# Patient Record
Sex: Female | Born: 1980 | Race: Black or African American | Hispanic: No | Smoking: Former smoker
Health system: Southern US, Community
[De-identification: ages and names within clinical notes are randomized; demographics above are authoritative.]

## PROBLEM LIST (undated history)

## (undated) DIAGNOSIS — E119 Type 2 diabetes mellitus without complications: Secondary | ICD-10-CM

## (undated) DIAGNOSIS — F329 Major depressive disorder, single episode, unspecified: Secondary | ICD-10-CM

## (undated) DIAGNOSIS — N912 Amenorrhea, unspecified: Secondary | ICD-10-CM

## (undated) DIAGNOSIS — F419 Anxiety disorder, unspecified: Secondary | ICD-10-CM

## (undated) DIAGNOSIS — J329 Chronic sinusitis, unspecified: Secondary | ICD-10-CM

## (undated) DIAGNOSIS — T4145XA Adverse effect of unspecified anesthetic, initial encounter: Secondary | ICD-10-CM

## (undated) DIAGNOSIS — N979 Female infertility, unspecified: Secondary | ICD-10-CM

## (undated) DIAGNOSIS — R0602 Shortness of breath: Secondary | ICD-10-CM

## (undated) DIAGNOSIS — R51 Headache: Secondary | ICD-10-CM

## (undated) DIAGNOSIS — T8859XA Other complications of anesthesia, initial encounter: Secondary | ICD-10-CM

## (undated) DIAGNOSIS — E282 Polycystic ovarian syndrome: Secondary | ICD-10-CM

## (undated) DIAGNOSIS — E669 Obesity, unspecified: Secondary | ICD-10-CM

## (undated) DIAGNOSIS — I10 Essential (primary) hypertension: Secondary | ICD-10-CM

## (undated) HISTORY — DX: Polycystic ovarian syndrome: E28.2

## (undated) HISTORY — DX: Amenorrhea, unspecified: N91.2

## (undated) HISTORY — DX: Essential (primary) hypertension: I10

## (undated) HISTORY — DX: Female infertility, unspecified: N97.9

## (undated) HISTORY — DX: Anxiety disorder, unspecified: F41.9

---

## 2009-02-08 DIAGNOSIS — F32A Depression, unspecified: Secondary | ICD-10-CM

## 2009-02-08 HISTORY — DX: Depression, unspecified: F32.A

## 2012-03-27 ENCOUNTER — Emergency Department (HOSPITAL_COMMUNITY): Payer: PRIVATE HEALTH INSURANCE

## 2012-03-27 ENCOUNTER — Other Ambulatory Visit: Payer: Self-pay | Admitting: Emergency Medicine

## 2012-03-27 ENCOUNTER — Emergency Department (HOSPITAL_COMMUNITY)
Admission: EM | Admit: 2012-03-27 | Discharge: 2012-03-27 | Disposition: A | Discharge status: Home / Self Care | Payer: PRIVATE HEALTH INSURANCE | Attending: Dermatology | Admitting: Dermatology

## 2012-03-27 ENCOUNTER — Encounter (HOSPITAL_COMMUNITY): Payer: Self-pay | Admitting: Emergency Medicine

## 2012-03-27 DIAGNOSIS — Z3202 Encounter for pregnancy test, result negative: Secondary | ICD-10-CM | POA: Insufficient documentation

## 2012-03-27 DIAGNOSIS — Z79899 Other long term (current) drug therapy: Secondary | ICD-10-CM | POA: Insufficient documentation

## 2012-03-27 DIAGNOSIS — R590 Localized enlarged lymph nodes: Secondary | ICD-10-CM

## 2012-03-27 DIAGNOSIS — Z8742 Personal history of other diseases of the female genital tract: Secondary | ICD-10-CM | POA: Insufficient documentation

## 2012-03-27 DIAGNOSIS — Z8639 Personal history of other endocrine, nutritional and metabolic disease: Secondary | ICD-10-CM | POA: Insufficient documentation

## 2012-03-27 DIAGNOSIS — F17200 Nicotine dependence, unspecified, uncomplicated: Secondary | ICD-10-CM | POA: Insufficient documentation

## 2012-03-27 DIAGNOSIS — IMO0002 Reserved for concepts with insufficient information to code with codable children: Secondary | ICD-10-CM | POA: Insufficient documentation

## 2012-03-27 DIAGNOSIS — J3489 Other specified disorders of nose and nasal sinuses: Secondary | ICD-10-CM | POA: Insufficient documentation

## 2012-03-27 DIAGNOSIS — R0989 Other specified symptoms and signs involving the circulatory and respiratory systems: Secondary | ICD-10-CM | POA: Insufficient documentation

## 2012-03-27 DIAGNOSIS — R0602 Shortness of breath: Secondary | ICD-10-CM | POA: Insufficient documentation

## 2012-03-27 DIAGNOSIS — E119 Type 2 diabetes mellitus without complications: Secondary | ICD-10-CM | POA: Insufficient documentation

## 2012-03-27 DIAGNOSIS — R599 Enlarged lymph nodes, unspecified: Secondary | ICD-10-CM | POA: Insufficient documentation

## 2012-03-27 DIAGNOSIS — R0600 Dyspnea, unspecified: Secondary | ICD-10-CM

## 2012-03-27 DIAGNOSIS — R0609 Other forms of dyspnea: Secondary | ICD-10-CM | POA: Insufficient documentation

## 2012-03-27 DIAGNOSIS — Z862 Personal history of diseases of the blood and blood-forming organs and certain disorders involving the immune mechanism: Secondary | ICD-10-CM | POA: Insufficient documentation

## 2012-03-27 HISTORY — DX: Type 2 diabetes mellitus without complications: E11.9

## 2012-03-27 LAB — CBC
Hemoglobin: 14.6 g/dL (ref 12.0–15.0)
MCH: 28.1 pg (ref 26.0–34.0)
MCHC: 33 g/dL (ref 30.0–36.0)
Platelets: 244 10*3/uL (ref 150–400)
RDW: 12.7 % (ref 11.5–15.5)

## 2012-03-27 LAB — COMPREHENSIVE METABOLIC PANEL
ALT: 19 U/L (ref 0–35)
Calcium: 9.4 mg/dL (ref 8.4–10.5)
GFR calc Af Amer: >90 mL/min (ref >90)
Glucose, Bld: 111 mg/dL — ABNORMAL HIGH (ref 70–99)
Sodium: 138 mEq/L (ref 135–145)
Total Protein: 8.2 g/dL (ref 6.0–8.3)

## 2012-03-27 LAB — URINALYSIS, ROUTINE W REFLEX MICROSCOPIC
Ketones, ur: NEGATIVE mg/dL
Leukocytes, UA: NEGATIVE
Nitrite: NEGATIVE
Protein, ur: NEGATIVE mg/dL
Urobilinogen, UA: 1 mg/dL (ref 0.0–1.0)

## 2012-03-27 LAB — RAPID URINE DRUG SCREEN, HOSP PERFORMED
Barbiturates: NOT DETECTED
Opiates: NOT DETECTED
Tetrahydrocannabinol: NOT DETECTED

## 2012-03-27 LAB — PREGNANCY, URINE: Preg Test, Ur: NEGATIVE

## 2012-03-27 LAB — PRO B NATRIURETIC PEPTIDE: Pro B Natriuretic peptide (BNP): 15.1 pg/mL (ref 0–125)

## 2012-03-27 MED ORDER — ALBUTEROL SULFATE (5 MG/ML) 0.5% IN NEBU
5.0000 mg | INHALATION_SOLUTION | Freq: Once | RESPIRATORY_TRACT | Status: AC
  Administered 2012-03-27: 5 mg via RESPIRATORY_TRACT
  Filled 2012-03-27: qty 1

## 2012-03-27 MED ORDER — IOHEXOL 350 MG/ML SOLN
100.0000 mL | Freq: Once | INTRAVENOUS | Status: AC | PRN
  Administered 2012-03-27: 100 mL via INTRAVENOUS

## 2012-03-27 MED ORDER — IPRATROPIUM BROMIDE 0.02 % IN SOLN
0.5000 mg | Freq: Once | RESPIRATORY_TRACT | Status: AC
  Administered 2012-03-27: 0.5 mg via RESPIRATORY_TRACT
  Filled 2012-03-27: qty 2.5

## 2012-03-27 MED ORDER — PREDNISONE 20 MG PO TABS: 30.0000 mg | ORAL_TABLET | Freq: Every day | ORAL | Status: AC

## 2012-03-27 MED ORDER — ALBUTEROL SULFATE HFA 108 (90 BASE) MCG/ACT IN AERS
2.0000 | INHALATION_SPRAY | RESPIRATORY_TRACT | Status: AC
  Administered 2012-03-27: 2 via RESPIRATORY_TRACT
  Filled 2012-03-27: qty 6.7

## 2012-03-27 NOTE — ED Provider Notes (Signed)
History     CSN: [id number]  Arrival date & time 03/27/12  1315   First MD Initiated Contact with Patient 03/27/12 1348      Chief Complaint  Patient presents with  . Facial Swelling    (Consider location/radiation/quality/duration/timing/severity/associated sxs/prior treatment) The history is provided by the patient.  pt presents w multiple complaints today. States has felt sob for the past 2-3 months with decreased exercise tolerance, doe. States this has caused her to quit exercising. Denies any associated cp or discomfort.+smoker. Denies hx asthma, no prior inhaler use.  Also states has persistent sinus congestion and occasional nasal drainage for the past 1-2 months. States was placed on flonase and abx by urgent care but no change in symptoms. Denies hx prior sinusitis or allergy symptoms. States today bilateral face felt swollen this morning, but better now. Denies facial rash. Pt indicates has hx polycystic ovaries, and that normally has no periods, but that in the past 1-2 months has had a period 3x. No current vaginal bleeding or discharge. No abd or pelvic pain. States w exercise and watching diet, trying to loose wt had lost 60 lbs in past 1-2 years, but states wt recently stable. Denies heat or cold intolerance, no sweats or chills, no skin or hair changes, no hx thyroid disease. Denies polyuria, polydipsia of hx diabetes.      Past Medical History  Diagnosis Date  . Diabetes mellitus without complication     History reviewed. No pertinent past surgical history.  No family history on file.  History  Substance Use Topics  . Smoking status: Current Every Day Smoker -- 3.00 packs/day  . Smokeless tobacco: Not on file  . Alcohol Use: No    OB History   Grav Para Term Preterm Abortions TAB SAB Ect Mult Living                  Review of Systems  Constitutional: Negative for fever and chills.  HENT: Positive for congestion. Negative for sore throat, trouble  swallowing, neck pain and neck stiffness.   Eyes: Negative for redness and visual disturbance.  Respiratory: Positive for shortness of breath. Negative for cough.   Cardiovascular: Negative for chest pain, palpitations and leg swelling.  Gastrointestinal: Negative for vomiting, abdominal pain and diarrhea.  Endocrine: Negative for polydipsia and polyuria.  Genitourinary: Negative for dysuria and flank pain.  Musculoskeletal: Negative for back pain.  Skin: Negative for rash.  Allergic/Immunologic: Negative for environmental allergies.  Neurological: Negative for weakness, numbness and headaches.  Hematological: Does not bruise/bleed easily.  Psychiatric/Behavioral: Negative for confusion.    Allergies  Review of patient's allergies indicates no known allergies.  Home Medications   Current Outpatient Rx  Name  Route  Sig  Dispense  Refill  . fluticasone (FLONASE) 50 MCG/ACT nasal spray   Nasal   Place 2 sprays into the nose daily.         Marland Kitchen oxymetazoline (AFRIN) 0.05 % nasal spray   Nasal   Place 2 sprays into the nose 2 (two) times daily.           BP 166/119  Pulse 120  Temp(Src) 98.6 F (37 C) (Oral)  SpO2 95%  LMP 03/23/2012  Physical Exam  Nursing note and vitals reviewed. Constitutional: She is oriented to person, place, and time. She appears well-developed and well-nourished. No distress.  HENT:  Mouth/Throat: Oropharynx is clear and moist.  Mild nasal congestion  Eyes: Conjunctivae are normal. Pupils are equal, round,  and reactive to light. No scleral icterus.  Neck: Normal range of motion. Neck supple. No tracheal deviation present. No thyromegaly present.  No stiffness or rigidity  Cardiovascular: Regular rhythm, normal heart sounds and intact distal pulses.  Exam reveals no gallop and no friction rub.   No murmur heard. tachycardic  Pulmonary/Chest: Effort normal. No respiratory distress. She has wheezes.  Faint exp wheeze.   Abdominal: Soft. Normal  appearance. She exhibits no distension and no mass. There is no tenderness. There is no rebound and no guarding.  obese  Genitourinary:  No cva tenderness  Musculoskeletal: She exhibits no edema and no tenderness.  Lymphadenopathy:    She has no cervical adenopathy.  Neurological: She is alert and oriented to person, place, and time.  Motor intact bil. Steady gait.   Skin: Skin is warm and dry. No rash noted.  Psychiatric: She has a normal mood and affect.    ED Course  Procedures (including critical care time)   Results for orders placed during the hospital encounter of 03/27/12  URINALYSIS, ROUTINE W REFLEX MICROSCOPIC      Result Value Range   Color, Urine YELLOW  YELLOW   APPearance CLEAR  CLEAR   Specific Gravity, Urine 1.015  1.005 - 1.030   pH 6.0  5.0 - 8.0   Glucose, UA NEGATIVE  NEGATIVE mg/dL   Hgb urine dipstick NEGATIVE  NEGATIVE   Bilirubin Urine NEGATIVE  NEGATIVE   Ketones, ur NEGATIVE  NEGATIVE mg/dL   Protein, ur NEGATIVE  NEGATIVE mg/dL   Urobilinogen, UA 1.0  0.0 - 1.0 mg/dL   Nitrite NEGATIVE  NEGATIVE   Leukocytes, UA NEGATIVE  NEGATIVE  CBC      Result Value Range   WBC 4.1  4.0 - 10.5 K/uL   RBC 5.20 (*) 3.87 - 5.11 MIL/uL   Hemoglobin 14.6  12.0 - 15.0 g/dL   HCT 46.9  62.9 - 52.8 %   MCV 85.2  78.0 - 100.0 fL   MCH 28.1  26.0 - 34.0 pg   MCHC 33.0  30.0 - 36.0 g/dL   RDW 41.3  24.4 - 01.0 %   Platelets 244  150 - 400 K/uL  COMPREHENSIVE METABOLIC PANEL      Result Value Range   Sodium 138  135 - 145 mEq/L   Potassium 3.8  3.5 - 5.1 mEq/L   Chloride 100  96 - 112 mEq/L   CO2 24  19 - 32 mEq/L   Glucose, Bld 111 (*) 70 - 99 mg/dL   BUN 12  6 - 23 mg/dL   Creatinine, Ser 2.72  0.50 - 1.10 mg/dL   Calcium 9.4  8.4 - 53.6 mg/dL   Total Protein 8.2  6.0 - 8.3 g/dL   Albumin 3.4 (*) 3.5 - 5.2 g/dL   AST 35  0 - 37 U/L   ALT 19  0 - 35 U/L   Alkaline Phosphatase 135 (*) 39 - 117 U/L   Total Bilirubin 0.3  0.3 - 1.2 mg/dL   GFR calc non Af  Amer >90  >90 mL/min   GFR calc Af Amer >90  >90 mL/min  D-DIMER, QUANTITATIVE      Result Value Range   D-Dimer, Quant 3.45 (*) 0.00 - 0.48 ug/mL-FEU  TROPONIN I      Result Value Range   Troponin I <0.30  <0.30 ng/mL  PRO B NATRIURETIC PEPTIDE      Result Value Range   Pro B  Natriuretic peptide (BNP) 15.1  0 - 125 pg/mL  PREGNANCY, URINE      Result Value Range   Preg Test, Ur NEGATIVE  NEGATIVE  URINE RAPID DRUG SCREEN (HOSP PERFORMED)      Result Value Range   Opiates NONE DETECTED  NONE DETECTED   Cocaine NONE DETECTED  NONE DETECTED   Benzodiazepines NONE DETECTED  NONE DETECTED   Amphetamines NONE DETECTED  NONE DETECTED   Tetrahydrocannabinol NONE DETECTED  NONE DETECTED   Barbiturates NONE DETECTED  NONE DETECTED  POCT PREGNANCY, URINE      Result Value Range   Preg Test, Ur NEGATIVE  NEGATIVE   Dg Chest 2 View  03/27/2012  *RADIOLOGY REPORT*  Clinical Data: Shortness of breath  CHEST - 2 VIEW  Comparison: None.  Findings: The interstitium is diffusely prominent.  There is no airspace consolidation.  Heart size and pulmonary vascularity are normal.  There is no appreciable adenopathy.  There is lower thoracic dextroscoliosis with thoracolumbar levoscoliosis.  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  In the acute setting, atypical pneumonia or allergic type phenomenon are possibilities. Any history of recent chemical exposure or allergic type reaction could be helpful for further assessment.  Would advise followup study in 5-7 days to further evaluate given the absence of prior studies to compare with current findings.  No airspace consolidation is currently appreciable.   Original Report Authenticated By: Bretta Bang, M.D.       MDM  Iv ns. Labs. Cxr.  Recheck vitals.  Albuterol and atrovent neb.   Date: 03/27/2012  Rate: 118  Rhythm: sinus tachycardia  QRS Axis: normal  Intervals: normal  ST/T Wave abnormalities:  normal  Conduction Disutrbances:none  Narrative Interpretation:   Old EKG Reviewed: none available   ddimer high, dyspnea, tachy - ct angio chest r/o pe pending.   Signed out to Dr Hyacinth Meeker, to check CT when back, recheck pt/vitals, dispo appropriately.           Suzi Roots, MD 03/27/12 1540

## 2012-03-27 NOTE — ED Notes (Signed)
Pt presenting to ed with c/o facial swelling onset this morning. Pt states she also has shortness of breath. Pt states she was recently on antibiotics for a sinus infection pt states the antibiotics have not helped. Pt states shortness of breath since December. Pt denies chest pain at this time. Pt states positive nausea no vomiting. Pt states she has had her period 3 times within the past 30 days. Pt with multiple c/o in triage

## 2012-03-27 NOTE — ED Notes (Signed)
Patient transported to CT 

## 2012-03-27 NOTE — ED Notes (Signed)
Voiced understanding of instructions given 

## 2012-03-27 NOTE — ED Notes (Signed)
Respiratory therapist paged for duoneb tx.

## 2012-04-20 ENCOUNTER — Encounter: Payer: Self-pay | Admitting: Internal Medicine

## 2012-04-20 ENCOUNTER — Ambulatory Visit (INDEPENDENT_AMBULATORY_CARE_PROVIDER_SITE_OTHER): Payer: PRIVATE HEALTH INSURANCE | Admitting: Internal Medicine

## 2012-04-20 VITALS — BP 122/80 | HR 118 | Temp 98.2°F | Ht 63.0 in | Wt 224.6 lb

## 2012-04-20 DIAGNOSIS — R599 Enlarged lymph nodes, unspecified: Secondary | ICD-10-CM

## 2012-04-20 DIAGNOSIS — R590 Localized enlarged lymph nodes: Secondary | ICD-10-CM

## 2012-04-20 DIAGNOSIS — J310 Chronic rhinitis: Secondary | ICD-10-CM | POA: Insufficient documentation

## 2012-04-20 MED ORDER — AZELASTINE-FLUTICASONE 137-50 MCG/ACT NA SUSP: 1.0000 | Freq: Two times a day (BID) | NASAL | Status: DC

## 2012-04-20 NOTE — Progress Notes (Signed)
Subjective:     Patient ID: Tanya Hardy, female   DOB: 1980-07-22      MRN: [id number]  HPI  41 yobf quit smoking Feb 2014 with onset of nasal symptoms in Aug 2013 progressing to sob x christmas 2013 seen in ER 03/27/12 with classic sarcoid changes so referred to pulmonary clinic 04/20/2012    04/20/2012 1st pulmonary eval cc sob used to exercise but can't exercise now and in fact brisk walk oob x 100 ft assoc with severe nasal obst and cough productive of tbsp in am to a cup by then of day with slt greenish tint.   rx with abx and flonase no better.   Trouble sleeping due to dry mouth and stuffy nose and sleeps sitting   Also denies any obvious fluctuation of symptoms with weather or environmental changes or other aggravating or alleviating factors except as outlined above   ROS  The following are not active complaints unless bolded sore throat, dysphagia, dental problems, itching, sneezing,  nasal congestion or excess/ purulent secretions, ear ache,   fever, chills, sweats, unintended wt loss, pleuritic or exertional cp, hemoptysis,  orthopnea pnd or leg swelling, presyncope, palpitations, heartburn, abdominal pain, anorexia, nausea, vomiting, diarrhea  or change in bowel or urinary habits, change in stools or urine, dysuria,hematuria,  rash, arthralgias, visual complaints, headache, numbness weakness or ataxia or problems with walking or coordination,  change in mood/affect or memory.        Review of Systems     Objective:   Physical Exam amb hoarse  bf nad with nasal tone to voice Wt Readings from Last 3 Encounters:  04/20/12 224 lb 9.6 oz (101.878 kg)      HEENT: nl dentition,   and orophanx. Nl external ear canals without cough reflex. Severe turbinate edema   NECK :  without JVD/Nodes/TM/ nl carotid upstrokes bilaterally   LUNGS: no acc muscle use, clear to A and P bilaterally without cough on insp or exp maneuvers   CV:  RRR  no s3 or murmur or increase in P2, no edema    ABD:  soft and nontender with nl excursion in the supine position. No bruits or organomegaly, bowel sounds nl  MS:  warm without deformities, calf tenderness, cyanosis or clubbing  SKIN: warm and dry without lesions    NEURO:  alert, approp, no deficits  cxr 03/27/12 Widespread interstitial disease of uncertain etiology or  chronicity. In the acute setting, atypical pneumonia or allergic  type phenomenon are possibilities.    Assessment:           Plan:

## 2012-04-20 NOTE — Patient Instructions (Signed)
I emphasized that nasal steroids (dymista)  have no immediate benefit in terms of improving symptoms.  To help them reached the target tissue, the patient should use Afrin two puffs every 12 hours applied one min before using the nasal steroids.  Afrin should be stopped after no more than 5 days.  If the symptoms worsen, Afrin can be restarted after 5 days off of therapy to prevent rebound congestion from overuse of Afrin.  I also emphasized that in no way are nasal steroids a concern in terms of "addiction".   Sarcoidosis is a benign inflammatory condition caused by  The  immune system being too revved up like a thermostat on your furnace that's partially  stuck causing arthitis, rash, short of breath and cough and vision issues.  It typically burns itself out in 75% of patients by the end of 3 years with little to indicate that we really change the natural course of the disease by aggressive treatments intended to alter it.  Treatment is generally reserved for patients with major symptoms we can attribute to sarcoid or if a vital organ (like the eye or kidney or nervous system) becomes affected.   [facility] bronch[facility] Someone will need to drive you home Nothing to eat after midnight before the test

## 2012-04-21 NOTE — Assessment & Plan Note (Signed)
-   trial of dymista 04/20/2012

## 2012-04-21 NOTE — Assessment & Plan Note (Signed)
This is almost certainly sarcoidosis with nasal involvement as well as there is not much else besides lymphoma or lymphangitic carcinomatosis that causes so much ILD plus adenopathy.  Discussed etiology natural hx and treatment options with pt and rec fob for tissue dx and she wants to think about it   For now focus on treating her chief cc = nasal obst symptoms

## 2012-04-25 ENCOUNTER — Telehealth: Payer: Self-pay | Admitting: Internal Medicine

## 2012-04-25 NOTE — Telephone Encounter (Signed)
Ok with me, I will schedule at 815 at [facility] and they will be in touch with her for details of when to arrive but nothing to eat or drink after midnight prior to the bronchoscopy

## 2012-04-25 NOTE — Telephone Encounter (Signed)
I spoke with pt and advised her will get the message over to MW letting him know. Is this date okay. Please advise thanks

## 2012-04-26 NOTE — Telephone Encounter (Signed)
Unable to schedule for 3/31-   See if she can do one week later since she prefers Monday

## 2012-04-26 NOTE — Telephone Encounter (Signed)
Called Respiratory, spoke with Marcelino Duster.  She was calling to let us know March 31 wasn't available but MW has already been informed of this.   Marcelino Duster aware we are waiting for pt to call us back to see if she can do 1 wk later - Monday, April 7.   Tentatively, we have booked bronch for Monday, April 7 at 8:15 am at [facility]  IF pt can do this date, we will need to confirm everything with Dr. Sherene Sires.  We will need to call Marcelino Duster back to either way.  I called pt back again.  Went directly to VM - lmomtcb

## 2012-04-26 NOTE — Telephone Encounter (Signed)
Ok for April 7

## 2012-04-26 NOTE — Telephone Encounter (Signed)
Is this a bronch if so dr wert schedules his own bronch i don't do those Tobe Sos

## 2012-04-26 NOTE — Telephone Encounter (Signed)
lmomtcb for pt -- 1 wk after March 31 is April 7

## 2012-04-26 NOTE — Telephone Encounter (Signed)
lmomtcb x1 for pt. Will also forward to [facility] for precert and to Dr. Sherene Sires as a reminder.

## 2012-04-26 NOTE — Telephone Encounter (Signed)
Michele @ Respiratory called to state that the date dr. Sherene Sires had requested is not avail for bronch. Call her back at [phone]. Hazel Sams

## 2012-04-26 NOTE — Telephone Encounter (Signed)
lmtcb

## 2012-04-26 NOTE — Telephone Encounter (Signed)
Patient aware of appt date/time and is okay with this. Bronch information to be mailed to patient with highlighted info on sheet. Will send message to leslie as FYI that this appt date for 05/15/12 at 815 is okay.

## 2012-05-12 ENCOUNTER — Encounter (HOSPITAL_COMMUNITY): Payer: Self-pay

## 2012-05-15 ENCOUNTER — Ambulatory Visit (HOSPITAL_COMMUNITY)
Admission: RE | Admit: 2012-05-15 | Discharge: 2012-05-15 | Disposition: A | Discharge status: Home / Self Care | Payer: PRIVATE HEALTH INSURANCE | Source: Ambulatory Visit | Attending: Internal Medicine | Admitting: Internal Medicine

## 2012-05-15 ENCOUNTER — Encounter (HOSPITAL_COMMUNITY): Payer: Self-pay | Admitting: Respiratory Therapy

## 2012-05-15 ENCOUNTER — Encounter (HOSPITAL_COMMUNITY)
Admission: RE | Disposition: A | Discharge status: Home / Self Care | Payer: Self-pay | Source: Ambulatory Visit | Attending: Internal Medicine

## 2012-05-15 DIAGNOSIS — D869 Sarcoidosis, unspecified: Secondary | ICD-10-CM | POA: Insufficient documentation

## 2012-05-15 DIAGNOSIS — Z538 Procedure and treatment not carried out for other reasons: Secondary | ICD-10-CM | POA: Insufficient documentation

## 2012-05-15 HISTORY — PX: VIDEO BRONCHOSCOPY: SHX5072

## 2012-05-15 LAB — GLUCOSE, CAPILLARY

## 2012-05-15 SURGERY — BRONCHOSCOPY, WITH FLUOROSCOPY
Anesthesia: Moderate Sedation | Laterality: Bilateral

## 2012-05-15 MED ORDER — MEPERIDINE HCL 100 MG/ML IJ SOLN: 100.0000 mg | Freq: Once | INTRAMUSCULAR | Status: DC

## 2012-05-15 MED ORDER — LIDOCAINE HCL 2 % EX GEL
CUTANEOUS | Status: DC | PRN
  Administered 2012-05-15: 1

## 2012-05-15 MED ORDER — MIDAZOLAM HCL 10 MG/2ML IJ SOLN
INTRAMUSCULAR | Status: DC | PRN
  Administered 2012-05-15 (×2): 5 mg via INTRAVENOUS

## 2012-05-15 MED ORDER — MEPERIDINE HCL 100 MG/ML IJ SOLN
INTRAMUSCULAR | Status: AC
  Filled 2012-05-15: qty 2

## 2012-05-15 MED ORDER — MIDAZOLAM HCL 10 MG/2ML IJ SOLN
INTRAMUSCULAR | Status: AC
  Filled 2012-05-15: qty 4

## 2012-05-15 MED ORDER — PHENYLEPHRINE HCL 0.25 % NA SOLN
NASAL | Status: DC | PRN
  Administered 2012-05-15: 1 via NASAL

## 2012-05-15 MED ORDER — SODIUM CHLORIDE 0.9 % IV SOLN
INTRAVENOUS | Status: DC
  Administered 2012-05-15: 09:00:00 via INTRAVENOUS

## 2012-05-15 MED ORDER — MEPERIDINE HCL 25 MG/ML IJ SOLN
INTRAMUSCULAR | Status: DC | PRN
  Administered 2012-05-15: 50 mg via INTRAVENOUS

## 2012-05-15 MED ORDER — MIDAZOLAM HCL 10 MG/2ML IJ SOLN: 1.0000 mg | Freq: Once | INTRAMUSCULAR | Status: DC

## 2012-05-15 MED ORDER — LIDOCAINE HCL (PF) 1 % IJ SOLN
INTRAMUSCULAR | Status: DC | PRN
  Administered 2012-05-15: 6 mL

## 2012-05-15 NOTE — H&P (Signed)
  31 yobf quit smoking Feb 2014 with onset of nasal symptoms in Aug 2013 progressing to sob x christmas 2013 seen in ER 03/27/12 with classic sarcoid changes so referred to pulmonary clinic 04/20/2012   04/20/2012 1st pulmonary eval cc sob used to exercise but can't exercise now and in fact brisk walk oob x 100 ft assoc with severe nasal obst and cough productive of tbsp in am to a cup by then of day with slt greenish tint. rx with abx and flonase no better.  Trouble sleeping due to dry mouth and stuffy nose and sleeps sitting  Also denies any obvious fluctuation of symptoms with weather or environmental changes or other aggravating or alleviating factors except as outlined above   ROS The following are not active complaints unless bolded  sore throat, dysphagia, dental problems, itching, sneezing, nasal congestion or excess/ purulent secretions, ear ache, fever, chills, sweats, unintended wt loss, pleuritic or exertional cp, hemoptysis, orthopnea pnd or leg swelling, presyncope, palpitations, heartburn, abdominal pain, anorexia, nausea, vomiting, diarrhea or change in bowel or urinary habits, change in stools or urine, dysuria,hematuria, rash, arthralgias, visual complaints, headache, numbness weakness or ataxia or problems with walking or coordination, change in mood/affect or memory.  Review of Systems   Objective:     Physical Exam  amb hoarse bf nad with nasal tone to voice    Wt Readings from Last 3 Encounters:    04/20/12  224 lb 9.6 oz (101.878 kg)    HEENT: nl dentition, and orophanx. Nl external ear canals without cough reflex. Severe turbinate edema  NECK : without JVD/Nodes/TM/ nl carotid upstrokes bilaterally  LUNGS: no acc muscle use, clear to A and P bilaterally without cough on insp or exp maneuvers  CV: RRR no s3 or murmur or increase in P2, no edema  ABD: soft and nontender with nl excursion in the supine position. No bruits or organomegaly, bowel sounds nl  MS: warm without  deformities, calf tenderness, cyanosis or clubbing  SKIN: warm and dry without lesions  NEURO: alert, approp, no deficits  cxr 03/27/12  Widespread interstitial disease of uncertain etiology or  chronicity. In the acute setting, atypical pneumonia or allergic  type phenomenon are possibilities.     Assessment:     Plan:       Lymphadenopathy, mediastinal - Nyoka Cowden, MD at 04/21/2012 9:01 AM    Status: Written Related Problem: Lymphadenopathy, mediastinal         This is almost certainly sarcoidosis with nasal involvement as well as there is not much else besides lymphoma or lymphangitic carcinomatosis that causes so much ILD plus adenopathy.  Discussed etiology natural hx and treatment options with pt and rec fob for tissue dx and she wants to think about it  For now focus on treating her chief cc = nasal obst symptoms   05/15/2012  Discussed in detail all the  indications, usual  risks and alternatives  relative to the benefits with patient who agrees to proceed with bronchoscopy with biopsy.   Sandrea Hughs, MD Pulmonary and Critical Care Medicine [facility] Cell [phone] After 5:30 PM or weekends, call [phone]

## 2012-05-15 NOTE — Progress Notes (Deleted)
Video Bronchoscopy Done  No samples taken

## 2012-05-15 NOTE — Op Note (Signed)
Bronchoscopy Procedure Note  Date of Operation: 07/09/2011  Pre-op Diagnosis: Sarcoid  Post-op Diagnosis: Same  Surgeon: Sandrea Hughs  Anesthesia: Monitored Local Anesthesia with Sedation  Operation: AttemptedVideo Flexible fiberoptic bronchoscopy, diagnostic   Findings: Unable to pass through either nose, unable to pass through mouth s severe gagging despite versed 10 mg IV and Demerol 50 mg IV and copious 1% xylocaine  Complications:None  Indications and History: See updated H and P same date. The risks, benefits, complications, treatment options and expected outcomes were discussed with the patient.  The possibilities of reaction to medication, pulmonary aspiration, perforation of a viscus, bleeding, failure to diagnose a condition and creating a complication requiring transfusion or operation were discussed with the patient who freely signed the consent.    Description of Procedure: The patient was re-examined in the bronchoscopy suite and the site of surgery properly noted/marked.  The patient was identified  and the procedure verified as Flexible Fiberoptic Bronchoscopy.  A Time Out was held and the above information confirmed.   After the induction of topical nasopharyngeal anesthesia, the patient was positioned  and the bronchoscope was attempted both nares and mouth and pt unable to tolerate so terminated procedure  Discussed situation with husband - will need to be done in OR with general anesthesia.  Sandrea Hughs, MD Pulmonary and Critical Care Medicine [facility] Cell [phone] After 5:30 PM or weekends, call [phone]

## 2012-05-16 ENCOUNTER — Encounter (HOSPITAL_COMMUNITY): Payer: Self-pay | Admitting: Internal Medicine

## 2012-05-17 ENCOUNTER — Telehealth: Payer: Self-pay | Admitting: Internal Medicine

## 2012-05-17 NOTE — Telephone Encounter (Signed)
Calling again in ref to previous msg.Tanya Hardy ° °

## 2012-05-17 NOTE — Telephone Encounter (Signed)
lmomtcb x1 

## 2012-05-18 NOTE — Telephone Encounter (Signed)
lmom to give me an afternoon and I'll try to work out with anesthesia putting her to sleep for the procedure

## 2012-05-18 NOTE — Telephone Encounter (Signed)
Returning call.  Please call after 3:30.

## 2012-05-18 NOTE — Telephone Encounter (Signed)
Dr Sherene Sires, when do you want to do the bronch in this pt? Please advise thanks!

## 2012-05-19 ENCOUNTER — Telehealth: Payer: Self-pay | Admitting: Internal Medicine

## 2012-05-19 NOTE — Telephone Encounter (Signed)
lmomtcb  

## 2012-05-19 NOTE — Telephone Encounter (Signed)
Tanya Cowden, MD at 05/18/2012 3:27 PM   Status: Signed            lmom to give me an afternoon and I'll try to work out with anesthesia putting her to sleep for the procedure   ----  I called and spoke with pt and she stated 05/26/12. Please advise MW thanks

## 2012-05-19 NOTE — Telephone Encounter (Signed)
Let her know that's good Friday and anesthesia not working that day, nor is resp.  They prefer Monday am and if not then give me some other dates/times and I'll work with anesthesia

## 2012-05-22 NOTE — Telephone Encounter (Signed)
LMOMTCB x 1 

## 2012-05-23 NOTE — Telephone Encounter (Signed)
lmtcb x1 for pt--that date is not possible per MW

## 2012-05-23 NOTE — Telephone Encounter (Signed)
Spoke with patient-- regarding new procedure date/time for bronch to be put to sleep. Patient states she can do Fri June 02, 2012 in the afternoon. Please advise if this is ok.  ALSO: Patient c/o bleeding nose. States it began as a faint and small amt 05/15/12 after attempted bronch but has progressively worsened and today has been bleedng for 3 hrs. Patient states it feels like it is trickling down her throat. States she she applied pressure and leaned head forward and back with no resolution. I asked patient is it bleeding so bad she thinks she should go to ED. She states she would like to hear recs per Dr. Sherene Sires. I advised patient that if its trickling down throat and has been bleeding for 3 hrs nonstop she should go to ED.  Patient again stated she would wait for recs from Dr. Sherene Sires. Dr. Sherene Sires please advise, thank you.

## 2012-05-23 NOTE — Telephone Encounter (Signed)
Pt called back again requesting to speak to nurse.  Tanya Hardy

## 2012-05-23 NOTE — Telephone Encounter (Signed)
Likely the reason her nose is bleeding and we can't pass the scope is the same problem in her lung and it may well be we can get a dx by having ent see her asap - they can be called from the er in a true emergency but prefer to see pts like her in office if possible > anyone first avail will do  As far as the bronch, right now the only slot anesthesia is available for is the 8am Monday slot and can't do the week of April 28

## 2012-05-23 NOTE — Telephone Encounter (Signed)
LMTCB x 1 

## 2012-05-23 NOTE — Telephone Encounter (Signed)
Pt called back.  2nd Issue:  Pt states that she is having bleeding out of her nose.  Wants to know why & what to do.  Pt wants called back right now.     Tanya Hardy

## 2012-05-23 NOTE — Telephone Encounter (Signed)
lmomtcb x 3 for pt 

## 2012-05-23 NOTE — Telephone Encounter (Signed)
Pt returned call.  Pt states she needs to do this procedure on Friday b/c that is when she is off of work.  Antionette Fairy

## 2012-05-23 NOTE — Telephone Encounter (Signed)
Patient stated she can not do Mon AM either. She requesting to another date and let her know far enough in advance so that she can take off work.  Patient can only come in tomorrow after 330pm. Scheduled w MW tomorrow at 4pm  Will forward to Dr. Sherene Sires as Lorain Childes

## 2012-05-24 ENCOUNTER — Ambulatory Visit (INDEPENDENT_AMBULATORY_CARE_PROVIDER_SITE_OTHER): Payer: PRIVATE HEALTH INSURANCE | Admitting: Internal Medicine

## 2012-05-24 ENCOUNTER — Encounter: Payer: Self-pay | Admitting: Internal Medicine

## 2012-05-24 VITALS — BP 134/86 | HR 120 | Temp 98.6°F | Ht 62.0 in | Wt 225.0 lb

## 2012-05-24 DIAGNOSIS — J310 Chronic rhinitis: Secondary | ICD-10-CM

## 2012-05-24 DIAGNOSIS — R590 Localized enlarged lymph nodes: Secondary | ICD-10-CM

## 2012-05-24 DIAGNOSIS — R599 Enlarged lymph nodes, unspecified: Secondary | ICD-10-CM

## 2012-05-24 MED ORDER — METHYLPREDNISOLONE ACETATE 80 MG/ML IJ SUSP
120.0000 mg | Freq: Once | INTRAMUSCULAR | Status: AC
  Administered 2012-05-24: 120 mg via INTRAMUSCULAR

## 2012-05-24 MED ORDER — AMOXICILLIN-POT CLAVULANATE 875-125 MG PO TABS: 1.0000 | ORAL_TABLET | Freq: Two times a day (BID) | ORAL | Status: DC

## 2012-05-24 NOTE — Progress Notes (Signed)
Subjective:     Patient ID: Tanya Hardy, female   DOB: 26-Nov-1980      MRN: [id number]  HPI  33 yobf quit smoking Feb 2014 with onset of nasal symptoms since Aug 2013 progressing to sob x christmas 2013 seen in ER 03/27/12 with classic sarcoid changes so referred to pulmonary clinic 04/20/2012    04/20/2012 1st pulmonary eval cc sob used to exercise but can't exercise now and in fact brisk walk oob x 100 ft assoc with severe nasal obst and cough productive of tbsp in am to a cup by then of day with slt greenish tint.   rx with abx and flonase no better.  rec FOB  FOB  05/15/12  not able to do because of nasal obstuction and couldn't tolerate oral   05/24/2012 f/u ov/Tanya Hardy possible sarcoid Chief Complaint  Patient presents with  . Acute Visit    Epistaxis, facial swelling and dry eyes on and off x 1 wk   not able to stop afrin chronically overusing. No sob but intermittent low grade epistaxis since fob attermpt 4/7.  No purulent secretions. No wore sob.   No obvious daytime variabilty or assoc   cp or chest tightness, subjective wheeze overt sinus or hb symptoms. No unusual exp hx or h/o childhood pna/ asthma or premature birth to her knowledge.   No h/o asa intolerance.  Also denies any obvious fluctuation of symptoms with weather or environmental changes or other aggravating or alleviating factors except as outlined above   ROS  The following are not active complaints unless bolded sore throat, dysphagia, dental problems, itching, sneezing,  nasal congestion or excess/ purulent secretions, ear ache,   fever, chills, sweats, unintended wt loss, pleuritic or exertional cp, hemoptysis,  orthopnea pnd or leg swelling, presyncope, palpitations, heartburn, abdominal pain, anorexia, nausea, vomiting, diarrhea  or change in bowel or urinary habits, change in stools or urine, dysuria,hematuria,  rash, arthralgias, visual complaints, headache, numbness weakness or ataxia or problems with walking or  coordination,  change in mood/affect or memory.              Objective:   Physical Exam amb hoarse  bf nad with nasal tone to voice   05/24/2012   225  Wt Readings from Last 3 Encounters:  04/20/12 224 lb 9.6 oz (101.878 kg)      HEENT: nl dentition,   and orophanx. Nl external ear canals without cough reflex. Severe turbinate edema with clots obst on L    NECK :  without JVD/Nodes/TM/ nl carotid upstrokes bilaterally   LUNGS: no acc muscle use, clear to A and P bilaterally without cough on insp or exp maneuvers   CV:  RRR  no s3 or murmur or increase in P2, no edema   ABD:  soft and nontender with nl excursion in the supine position. No bruits or organomegaly, bowel sounds nl  MS:  warm without deformities, calf tenderness, cyanosis or clubbing  SKIN: warm and dry without lesions       cxr 03/27/12 Widespread interstitial disease of uncertain etiology or  chronicity. In the acute setting, atypical pneumonia or allergic  type phenomenon are possibilities.    Assessment:           Plan:

## 2012-05-24 NOTE — Patient Instructions (Addendum)
Augmentin 875 twice daily x 10 days  Please see patient coordinator before you leave today  to schedule ENT ASAP  Once the nose issue is resolved we can reschedule the bronchoscopy without being restricted to Monday am but right now that is all that's available

## 2012-05-26 NOTE — Assessment & Plan Note (Signed)
-   See CT chest 03/27/12> fob  05/15/12 could not pass fob  Strongly suspect sarcoid but have not been able to obtain tissue dx which would be required to treat her chronically.  It may well be she also has sarcoid related rhinitis and since active epistaxis is her greatest immediate problem rec ENT eval first then if needed could try repeat fob with general anesthesia backup in case oral route needed

## 2012-05-26 NOTE — Assessment & Plan Note (Signed)
-   trial of dymista 04/20/2012 > not improved 05/24/12 > refer to Dr Pollyann Kennedy

## 2012-05-28 ENCOUNTER — Emergency Department (HOSPITAL_COMMUNITY)
Admission: EM | Admit: 2012-05-28 | Discharge: 2012-05-28 | Disposition: A | Discharge status: Home / Self Care | Payer: PRIVATE HEALTH INSURANCE | Attending: Emergency Medicine | Admitting: Emergency Medicine

## 2012-05-28 ENCOUNTER — Encounter (HOSPITAL_COMMUNITY): Payer: Self-pay | Admitting: *Deleted

## 2012-05-28 ENCOUNTER — Emergency Department (HOSPITAL_COMMUNITY): Payer: PRIVATE HEALTH INSURANCE

## 2012-05-28 DIAGNOSIS — Z79899 Other long term (current) drug therapy: Secondary | ICD-10-CM | POA: Insufficient documentation

## 2012-05-28 DIAGNOSIS — E119 Type 2 diabetes mellitus without complications: Secondary | ICD-10-CM | POA: Insufficient documentation

## 2012-05-28 DIAGNOSIS — R040 Epistaxis: Secondary | ICD-10-CM | POA: Insufficient documentation

## 2012-05-28 DIAGNOSIS — Z87891 Personal history of nicotine dependence: Secondary | ICD-10-CM | POA: Insufficient documentation

## 2012-05-28 NOTE — ED Notes (Signed)
The pt has had a nose bleed intermittently since April 7th after having a bronscopy through her nose

## 2012-05-28 NOTE — ED Provider Notes (Addendum)
History     CSN: [id number]  Arrival date & time 05/28/12  4098   First MD Initiated Contact with Patient 05/28/12 [phone]      Chief Complaint  Patient presents with  . Epistaxis    (Consider location/radiation/quality/duration/timing/severity/associated sxs/prior treatment) HPI....bilateral nosebleed this evening.  Improved hemostasis now.   Patient is being evaluated by pulmonary for possible nasal sarcoidosis.  Nothing makes symptoms better or worse. Severity is mild to moderate.   She has also been about an Actuary.   CT scan is pending  Past Medical History  Diagnosis Date  . Diabetes mellitus without complication     Past Surgical History  Procedure Laterality Date  . Video bronchoscopy Bilateral 05/15/2012    Procedure: VIDEO BRONCHOSCOPY WITH FLUORO;  Surgeon: Nyoka Cowden, MD;  Location: [facility];  Service: Cardiopulmonary;  Laterality: Bilateral;    Family History  Problem Relation Age of Onset  . Sarcoidosis Brother   . Brain cancer Paternal Grandfather   . Colon cancer Paternal Aunt     History  Substance Use Topics  . Smoking status: Former Smoker -- 0.25 packs/day for 11 years    Types: Cigarettes    Quit date: 04/07/2012  . Smokeless tobacco: Never Used  . Alcohol Use: No    OB History   Grav Para Term Preterm Abortions TAB SAB Ect Mult Living                  Review of Systems  All other systems reviewed and are negative.    Allergies  Review of patient's allergies indicates no known allergies.  Home Medications   Current Outpatient Rx  Name  Route  Sig  Dispense  Refill  . FOLIC ACID PO   Oral   Take 1 tablet by mouth daily.         Marland Kitchen oxymetazoline (AFRIN) 0.05 % nasal spray   Nasal   Place 2 sprays into the nose 2 (two) times daily.         . Prenatal MV-Min-Fe Fum-FA-DHA (PRENATAL 1 PO)   Oral   Take 1 tablet by mouth daily.           BP 151/100  Pulse 100  Resp 20  SpO2 92%  LMP 04/30/2012  Physical  Exam  Nursing note and vitals reviewed. Constitutional: She appears well-developed and well-nourished.  HENT:  Head: Normocephalic and atraumatic.  Blood clot in bilateral nares  Eyes: Conjunctivae and EOM are normal. Pupils are equal, round, and reactive to light.  Neck: Normal range of motion. Neck supple.  Cardiovascular: Normal rate, regular rhythm and normal heart sounds.   Pulmonary/Chest: Effort normal and breath sounds normal.  Abdominal: Soft. Bowel sounds are normal.  Musculoskeletal: Normal range of motion.  Neurological: She is alert.  Skin: Skin is warm and dry.  Psychiatric: She has a normal mood and affect.    ED Course  Procedures (including critical care time)  Labs Reviewed - No data to display No results found.   No diagnosis found.    MDM  CT scan maxillofacial  reveals enlargement of the bilateral lacrimal glands with mild overlying pre-septal soft tissue swelling.  ENT referral. No active bleeding at this time.      Donnetta Hutching, MD 05/28/12 [id number]  Donnetta Hutching, MD 06/09/12 [phone]

## 2012-09-11 ENCOUNTER — Encounter (HOSPITAL_COMMUNITY): Payer: Self-pay

## 2012-09-11 ENCOUNTER — Encounter (HOSPITAL_COMMUNITY): Payer: Self-pay | Admitting: Pharmacy Technician

## 2012-09-11 ENCOUNTER — Encounter (HOSPITAL_COMMUNITY)
Admission: RE | Admit: 2012-09-11 | Discharge: 2012-09-11 | Disposition: A | Discharge status: Home / Self Care | Payer: PRIVATE HEALTH INSURANCE | Source: Ambulatory Visit | Attending: Otolaryngology | Admitting: Otolaryngology

## 2012-09-11 DIAGNOSIS — Z01818 Encounter for other preprocedural examination: Secondary | ICD-10-CM | POA: Insufficient documentation

## 2012-09-11 DIAGNOSIS — Z01812 Encounter for preprocedural laboratory examination: Secondary | ICD-10-CM | POA: Insufficient documentation

## 2012-09-11 HISTORY — DX: Shortness of breath: R06.02

## 2012-09-11 HISTORY — DX: Other complications of anesthesia, initial encounter: T88.59XA

## 2012-09-11 HISTORY — DX: Chronic sinusitis, unspecified: J32.9

## 2012-09-11 HISTORY — DX: Major depressive disorder, single episode, unspecified: F32.9

## 2012-09-11 HISTORY — DX: Headache: R51

## 2012-09-11 HISTORY — DX: Adverse effect of unspecified anesthetic, initial encounter: T41.45XA

## 2012-09-11 LAB — CBC
HCT: 42.1 % (ref 36.0–46.0)
Hemoglobin: 13.9 g/dL (ref 12.0–15.0)
MCHC: 33 g/dL (ref 30.0–36.0)
MCV: 84.5 fL (ref 78.0–100.0)
WBC: 5.2 10*3/uL (ref 4.0–10.5)

## 2012-09-11 NOTE — Pre-Procedure Instructions (Signed)
Meta Kroenke  09/11/2012   Your procedure is scheduled on:  September 15, 2012 at 11:30  Report to [facility] at 9:30 AM.  Call this number if you have problems the morning of surgery: [phone]   Remember:   Do not eat food or drink liquids after midnight.   Take these medicines the morning of surgery with A SIP OF WATER: Afrin (Oxymetazoline) Nasal Spray              STOP  Prenatal Vitamins as of today, 09/11/12   Do not wear jewelry, make-up or nail polish.  Do not wear lotions, powders, or perfumes. You may wear deodorant.  Do not shave 48 hours prior to surgery. Men may shave face and neck.  Do not bring valuables to the hospital.  [facility] is not responsible                   for any belongings or valuables.  Contacts, dentures or bridgework may not be worn into surgery.  Leave suitcase in the car. After surgery it may be brought to your room.  For patients admitted to the hospital, checkout time is 11:00 AM the day of  discharge.   Patients discharged the day of surgery will not be allowed to drive  home.  Name and phone number of your driver: Family/Friend  Special Instructions: Shower using CHG 2 nights before surgery and the night before surgery.  If you shower the day of surgery use CHG.  Use special wash - you have one bottle of CHG for all showers.  You should use approximately 1/3 of the bottle for each shower.   Please read over the following fact sheets that you were given: Pain Booklet, Coughing and Deep Breathing and Surgical Site Infection Prevention

## 2012-09-11 NOTE — H&P (Signed)
Assessment  Chronic pansinusitis (473.8) (J32.4). Chronic rhinitis (472.0) (J31.0). Orders  CT Maxillofacial w/o contrast; Requested for: 01 Sep 2012. Discussed  She completed the antibiotics and feels about the same. Ears are healthy and clear. Oral cavity and pharynx clear. Nasal exam reveals significant improvement of the nasal airways bilaterally. The mucosa is still very dry and scabbing all the way back on both sides.   CT reveals complete pansinusitis without much change. Recommend we proceed with endoscopic sinus surgery. This will involve bilateral maxillary, ethmoid and frontal sinus surgery. This will be done at [facility] planning outpatient nature. Risks and benefits of surgery were discussed in detail. All questions were answered. Reason For Visit  No change in congestion. Allergies  No Known Drug Allergies. Current Meds  Folic Acid TABS;; RPT Prenatal TABS;; RPT. 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 Family Hx  Family history of diabetes mellitus: Grandmother (V18.0) (Z83.3) Family history of hypertension: Mother (V17.49) (Z28.49) Family history of rheumatoid arthritis: Grandmother (V17.7) (Z82.61) Stroke syndrome (I67.89). Personal Hx  Former smoker 28Feb2014 [phone]) [phone]) No alcohol use No caffeine use. Signature  Electronically signed by : Serena Colonel  M.D.; 09/01/2012 5:25 PM EST.

## 2012-09-14 MED ORDER — CEFAZOLIN SODIUM-DEXTROSE 2-3 GM-% IV SOLR
2.0000 g | INTRAVENOUS | Status: DC
  Filled 2012-09-14: qty 50

## 2012-09-15 ENCOUNTER — Ambulatory Visit (HOSPITAL_COMMUNITY): Payer: PRIVATE HEALTH INSURANCE | Admitting: Anesthesiology

## 2012-09-15 ENCOUNTER — Inpatient Hospital Stay (HOSPITAL_COMMUNITY)
Admission: RE | Admit: 2012-09-15 | Discharge: 2012-09-17 | DRG: 981 | Disposition: A | Discharge status: Home / Self Care | Payer: PRIVATE HEALTH INSURANCE | Source: Ambulatory Visit | Attending: Otolaryngology | Admitting: Otolaryngology

## 2012-09-15 ENCOUNTER — Ambulatory Visit (HOSPITAL_COMMUNITY): Payer: PRIVATE HEALTH INSURANCE

## 2012-09-15 ENCOUNTER — Encounter (HOSPITAL_COMMUNITY): Payer: Self-pay | Admitting: Vascular Surgery

## 2012-09-15 ENCOUNTER — Encounter (HOSPITAL_COMMUNITY): Payer: Self-pay | Admitting: Anesthesiology

## 2012-09-15 ENCOUNTER — Encounter (HOSPITAL_COMMUNITY)
Admission: RE | Disposition: A | Discharge status: Home / Self Care | Payer: Self-pay | Source: Ambulatory Visit | Attending: Otolaryngology

## 2012-09-15 ENCOUNTER — Encounter: Payer: Self-pay | Admitting: Internal Medicine

## 2012-09-15 DIAGNOSIS — R0602 Shortness of breath: Secondary | ICD-10-CM

## 2012-09-15 DIAGNOSIS — Z87891 Personal history of nicotine dependence: Secondary | ICD-10-CM

## 2012-09-15 DIAGNOSIS — E119 Type 2 diabetes mellitus without complications: Secondary | ICD-10-CM | POA: Diagnosis present

## 2012-09-15 DIAGNOSIS — J95821 Acute postprocedural respiratory failure: Secondary | ICD-10-CM | POA: Diagnosis present

## 2012-09-15 DIAGNOSIS — Z6841 Body Mass Index (BMI) 40.0 and over, adult: Secondary | ICD-10-CM

## 2012-09-15 DIAGNOSIS — I441 Atrioventricular block, second degree: Secondary | ICD-10-CM

## 2012-09-15 DIAGNOSIS — J330 Polyp of nasal cavity: Secondary | ICD-10-CM | POA: Diagnosis present

## 2012-09-15 DIAGNOSIS — R599 Enlarged lymph nodes, unspecified: Secondary | ICD-10-CM

## 2012-09-15 DIAGNOSIS — J99 Respiratory disorders in diseases classified elsewhere: Secondary | ICD-10-CM | POA: Diagnosis present

## 2012-09-15 DIAGNOSIS — J310 Chronic rhinitis: Secondary | ICD-10-CM | POA: Diagnosis present

## 2012-09-15 DIAGNOSIS — R590 Localized enlarged lymph nodes: Secondary | ICD-10-CM

## 2012-09-15 DIAGNOSIS — J4489 Other specified chronic obstructive pulmonary disease: Secondary | ICD-10-CM | POA: Diagnosis present

## 2012-09-15 DIAGNOSIS — R040 Epistaxis: Secondary | ICD-10-CM | POA: Diagnosis present

## 2012-09-15 DIAGNOSIS — R918 Other nonspecific abnormal finding of lung field: Secondary | ICD-10-CM | POA: Diagnosis present

## 2012-09-15 DIAGNOSIS — F3289 Other specified depressive episodes: Secondary | ICD-10-CM | POA: Diagnosis present

## 2012-09-15 DIAGNOSIS — J449 Chronic obstructive pulmonary disease, unspecified: Secondary | ICD-10-CM | POA: Diagnosis present

## 2012-09-15 DIAGNOSIS — F329 Major depressive disorder, single episode, unspecified: Secondary | ICD-10-CM | POA: Diagnosis present

## 2012-09-15 DIAGNOSIS — J329 Chronic sinusitis, unspecified: Secondary | ICD-10-CM

## 2012-09-15 DIAGNOSIS — J328 Other chronic sinusitis: Secondary | ICD-10-CM | POA: Diagnosis present

## 2012-09-15 DIAGNOSIS — D869 Sarcoidosis, unspecified: Principal | ICD-10-CM | POA: Diagnosis present

## 2012-09-15 DIAGNOSIS — J9600 Acute respiratory failure, unspecified whether with hypoxia or hypercapnia: Secondary | ICD-10-CM

## 2012-09-15 DIAGNOSIS — J9601 Acute respiratory failure with hypoxia: Secondary | ICD-10-CM | POA: Diagnosis present

## 2012-09-15 HISTORY — PX: NASAL SINUS SURGERY: SHX719

## 2012-09-15 HISTORY — DX: Obesity, unspecified: E66.9

## 2012-09-15 LAB — GLUCOSE, CAPILLARY
Glucose-Capillary: 82 mg/dL (ref 70–99)
Glucose-Capillary: 99 mg/dL (ref 70–99)

## 2012-09-15 SURGERY — SINUS SURGERY, ENDOSCOPIC
Anesthesia: General | Site: Nose | Wound class: Clean Contaminated

## 2012-09-15 MED ORDER — CEPHALEXIN 250 MG/5ML PO SUSR
500.0000 mg | Freq: Two times a day (BID) | ORAL | Status: DC
  Administered 2012-09-15 – 2012-09-16 (×3): 500 mg via ORAL
  Filled 2012-09-15 (×5): qty 10

## 2012-09-15 MED ORDER — 0.9 % SODIUM CHLORIDE (POUR BTL) OPTIME
TOPICAL | Status: DC | PRN
  Administered 2012-09-15: 1000 mL

## 2012-09-15 MED ORDER — ARTIFICIAL TEARS OP OINT
TOPICAL_OINTMENT | OPHTHALMIC | Status: DC | PRN
  Administered 2012-09-15: 1 via OPHTHALMIC

## 2012-09-15 MED ORDER — OXYMETAZOLINE HCL 0.05 % NA SOLN
NASAL | Status: AC
  Filled 2012-09-15: qty 15

## 2012-09-15 MED ORDER — DIPHENHYDRAMINE HCL 50 MG/ML IJ SOLN: 12.5000 mg | Freq: Four times a day (QID) | INTRAMUSCULAR | Status: DC | PRN

## 2012-09-15 MED ORDER — OXYCODONE HCL 5 MG/5ML PO SOLN: 5.0000 mg | Freq: Once | ORAL | Status: DC | PRN

## 2012-09-15 MED ORDER — INSULIN ASPART 100 UNIT/ML ~~LOC~~ SOLN
0.0000 [IU] | Freq: Three times a day (TID) | SUBCUTANEOUS | Status: DC
  Administered 2012-09-16: 2 [IU] via SUBCUTANEOUS

## 2012-09-15 MED ORDER — OXYCODONE HCL 5 MG PO TABS: 5.0000 mg | ORAL_TABLET | Freq: Once | ORAL | Status: DC | PRN

## 2012-09-15 MED ORDER — MIDAZOLAM HCL 5 MG/5ML IJ SOLN
INTRAMUSCULAR | Status: DC | PRN
  Administered 2012-09-15 (×2): .5 mg via INTRAVENOUS

## 2012-09-15 MED ORDER — PROMETHAZINE HCL 25 MG PO TABS: 25.0000 mg | ORAL_TABLET | Freq: Four times a day (QID) | ORAL | Status: DC | PRN

## 2012-09-15 MED ORDER — LIDOCAINE HCL (CARDIAC) 20 MG/ML IV SOLN
INTRAVENOUS | Status: DC | PRN
  Administered 2012-09-15: 100 mg via INTRAVENOUS

## 2012-09-15 MED ORDER — LACTATED RINGERS IV SOLN
INTRAVENOUS | Status: DC | PRN
  Administered 2012-09-15 (×2): via INTRAVENOUS

## 2012-09-15 MED ORDER — ROCURONIUM BROMIDE 100 MG/10ML IV SOLN
INTRAVENOUS | Status: DC | PRN
  Administered 2012-09-15: 50 mg via INTRAVENOUS

## 2012-09-15 MED ORDER — INSULIN ASPART 100 UNIT/ML ~~LOC~~ SOLN: 0.0000 [IU] | Freq: Every day | SUBCUTANEOUS | Status: DC

## 2012-09-15 MED ORDER — ALBUTEROL SULFATE (5 MG/ML) 0.5% IN NEBU
INHALATION_SOLUTION | RESPIRATORY_TRACT | Status: AC
  Filled 2012-09-15: qty 0.5

## 2012-09-15 MED ORDER — ESMOLOL HCL 10 MG/ML IV SOLN
INTRAVENOUS | Status: DC | PRN
  Administered 2012-09-15 (×4): 10 mg via INTRAVENOUS

## 2012-09-15 MED ORDER — GLYCOPYRROLATE 0.2 MG/ML IJ SOLN
INTRAMUSCULAR | Status: DC | PRN
  Administered 2012-09-15: .8 mg via INTRAVENOUS

## 2012-09-15 MED ORDER — CEPHALEXIN 500 MG PO CAPS: 500.0000 mg | ORAL_CAPSULE | Freq: Three times a day (TID) | ORAL | Status: DC

## 2012-09-15 MED ORDER — DEXTROSE-NACL 5-0.9 % IV SOLN
INTRAVENOUS | Status: DC
  Administered 2012-09-15: 17:00:00 via INTRAVENOUS

## 2012-09-15 MED ORDER — ONDANSETRON HCL 4 MG/2ML IJ SOLN
INTRAMUSCULAR | Status: DC | PRN
  Administered 2012-09-15 (×3): 4 mg via INTRAVENOUS

## 2012-09-15 MED ORDER — FENTANYL CITRATE 0.05 MG/ML IJ SOLN
INTRAMUSCULAR | Status: DC | PRN
  Administered 2012-09-15 (×3): 50 ug via INTRAVENOUS
  Administered 2012-09-15: 100 ug via INTRAVENOUS

## 2012-09-15 MED ORDER — NEOSTIGMINE METHYLSULFATE 1 MG/ML IJ SOLN
INTRAMUSCULAR | Status: DC | PRN
  Administered 2012-09-15: 5 mg via INTRAVENOUS

## 2012-09-15 MED ORDER — PROPOFOL 10 MG/ML IV BOLUS
INTRAVENOUS | Status: DC | PRN
  Administered 2012-09-15: 200 mg via INTRAVENOUS

## 2012-09-15 MED ORDER — IBUPROFEN 100 MG/5ML PO SUSP
400.0000 mg | Freq: Four times a day (QID) | ORAL | Status: DC | PRN
  Administered 2012-09-15 – 2012-09-17 (×4): 400 mg via ORAL
  Filled 2012-09-15 (×8): qty 20

## 2012-09-15 MED ORDER — MORPHINE SULFATE 2 MG/ML IJ SOLN: 1.0000 mg | INTRAMUSCULAR | Status: DC | PRN

## 2012-09-15 MED ORDER — PROMETHAZINE HCL 25 MG RE SUPP: 25.0000 mg | Freq: Four times a day (QID) | RECTAL | Status: DC | PRN

## 2012-09-15 MED ORDER — LIDOCAINE-EPINEPHRINE 1 %-1:100000 IJ SOLN
INTRAMUSCULAR | Status: AC
  Filled 2012-09-15: qty 1

## 2012-09-15 MED ORDER — PHENYLEPHRINE HCL 10 MG/ML IJ SOLN
INTRAMUSCULAR | Status: DC | PRN
  Administered 2012-09-15 (×2): 40 ug via INTRAVENOUS
  Administered 2012-09-15 (×4): 80 ug via INTRAVENOUS

## 2012-09-15 MED ORDER — MOMETASONE FURO-FORMOTEROL FUM 100-5 MCG/ACT IN AERO
2.0000 | INHALATION_SPRAY | Freq: Two times a day (BID) | RESPIRATORY_TRACT | Status: DC
  Administered 2012-09-15 – 2012-09-17 (×4): 2 via RESPIRATORY_TRACT
  Filled 2012-09-15: qty 8.8

## 2012-09-15 MED ORDER — HYDROMORPHONE HCL PF 1 MG/ML IJ SOLN
0.2500 mg | INTRAMUSCULAR | Status: DC | PRN
  Administered 2012-09-15: 0.25 mg via INTRAVENOUS

## 2012-09-15 MED ORDER — OXYMETAZOLINE HCL 0.05 % NA SOLN
NASAL | Status: DC | PRN
  Administered 2012-09-15: 1 via NASAL

## 2012-09-15 MED ORDER — DEXTROSE-NACL 5-0.9 % IV SOLN
INTRAVENOUS | Status: DC
  Administered 2012-09-16: 06:00:00 via INTRAVENOUS

## 2012-09-15 MED ORDER — HYDROCODONE-ACETAMINOPHEN 7.5-325 MG PO TABS: 1.0000 | ORAL_TABLET | Freq: Four times a day (QID) | ORAL | Status: DC | PRN

## 2012-09-15 MED ORDER — LIDOCAINE-EPINEPHRINE 1 %-1:100000 IJ SOLN
INTRAMUSCULAR | Status: DC | PRN
  Administered 2012-09-15: 6 mL

## 2012-09-15 MED ORDER — OXYMETAZOLINE HCL 0.05 % NA SOLN
2.0000 | NASAL | Status: DC
  Administered 2012-09-15: 2 via NASAL
  Filled 2012-09-15 (×2): qty 15

## 2012-09-15 MED ORDER — HYDROCODONE-ACETAMINOPHEN 5-325 MG PO TABS
1.0000 | ORAL_TABLET | ORAL | Status: DC | PRN
  Administered 2012-09-15 – 2012-09-17 (×5): 1 via ORAL
  Filled 2012-09-15 (×5): qty 1

## 2012-09-15 MED ORDER — SUCCINYLCHOLINE CHLORIDE 20 MG/ML IJ SOLN
INTRAMUSCULAR | Status: DC | PRN
  Administered 2012-09-15: 120 mg via INTRAVENOUS

## 2012-09-15 MED ORDER — DOUBLE ANTIBIOTIC 500-10000 UNIT/GM EX OINT
TOPICAL_OINTMENT | CUTANEOUS | Status: AC
  Filled 2012-09-15: qty 1

## 2012-09-15 MED ORDER — HYDROMORPHONE HCL PF 1 MG/ML IJ SOLN
INTRAMUSCULAR | Status: AC
  Filled 2012-09-15: qty 1

## 2012-09-15 MED ORDER — ALBUTEROL SULFATE (5 MG/ML) 0.5% IN NEBU: 2.5000 mg | INHALATION_SOLUTION | RESPIRATORY_TRACT | Status: DC | PRN

## 2012-09-15 SURGICAL SUPPLY — 32 items
ATTRACTOMAT 16X20 MAGNETIC DRP (DRAPES) ×2 IMPLANT
BLADE RAD40 ROTATE 4M 4 5PK (BLADE) IMPLANT
BLADE RAD60 ROTATE M4 4 5PK (BLADE) IMPLANT
BLADE TRICUT ROTATE M4 4 5PK (BLADE) ×2 IMPLANT
CANISTER SUCTION 2500CC (MISCELLANEOUS) ×4 IMPLANT
CLOTH BEACON ORANGE TIMEOUT ST (SAFETY) ×2 IMPLANT
DRESSING NASAL KENNEDY 3.5X.9 (MISCELLANEOUS) IMPLANT
DRSG NASAL KENNEDY 3.5X.9 (MISCELLANEOUS)
DRSG NASOPORE 8CM (GAUZE/BANDAGES/DRESSINGS) IMPLANT
ELECT REM PT RETURN 9FT ADLT (ELECTROSURGICAL) ×2
ELECTRODE REM PT RTRN 9FT ADLT (ELECTROSURGICAL) ×1 IMPLANT
FILTER ARTHROSCOPY CONVERTOR (FILTER) ×2 IMPLANT
GLOVE ECLIPSE 7.5 STRL STRAW (GLOVE) ×2 IMPLANT
GOWN STRL NON-REIN LRG LVL3 (GOWN DISPOSABLE) ×4 IMPLANT
KIT BASIN OR (CUSTOM PROCEDURE TRAY) ×2 IMPLANT
KIT ROOM TURNOVER OR (KITS) ×2 IMPLANT
NEEDLE 27GAX1X1/2 (NEEDLE) ×2 IMPLANT
NS IRRIG 1000ML POUR BTL (IV SOLUTION) ×2 IMPLANT
PAD ARMBOARD 7.5X6 YLW CONV (MISCELLANEOUS) ×4 IMPLANT
PATTIES SURGICAL .5 X3 (DISPOSABLE) ×2 IMPLANT
SHEATH ENDOSCRUB 0 DEG (SHEATH) IMPLANT
SHEATH ENDOSCRUB 30 DEG (SHEATH) IMPLANT
SPECIMEN JAR SMALL (MISCELLANEOUS) ×2 IMPLANT
SWAB COLLECTION DEVICE MRSA (MISCELLANEOUS) IMPLANT
SYR 50ML SLIP (SYRINGE) IMPLANT
TOWEL OR 17X24 6PK STRL BLUE (TOWEL DISPOSABLE) ×2 IMPLANT
TOWEL OR 17X26 10 PK STRL BLUE (TOWEL DISPOSABLE) ×2 IMPLANT
TRAY ENT MC OR (CUSTOM PROCEDURE TRAY) ×2 IMPLANT
TUBE ANAEROBIC SPECIMEN COL (MISCELLANEOUS) IMPLANT
TUBE CONNECTING 12X1/4 (SUCTIONS) ×2 IMPLANT
TUBING EXTENTION W/L.L. (IV SETS) ×2 IMPLANT
WATER STERILE IRR 1000ML POUR (IV SOLUTION) ×2 IMPLANT

## 2012-09-15 NOTE — Anesthesia Procedure Notes (Addendum)
Performed by: Cathie Olden B   Procedure Name: Intubation Date/Time: 09/15/2012 11:56 AM Performed by: Sherie Don Pre-anesthesia Checklist: Patient identified, Emergency Drugs available, Suction available, Patient being monitored and Timeout performed Patient Re-evaluated:Patient Re-evaluated prior to inductionOxygen Delivery Method: Circle system utilized Preoxygenation: Pre-oxygenation with 100% oxygen Intubation Type: Cricoid Pressure applied, Rapid sequence and IV induction Laryngoscope Size: Mac and 3 Grade View: Grade II Tube type: Oral Tube size: 7.5 mm Number of attempts: 1 Airway Equipment and Method: Stylet Placement Confirmation: ETT inserted through vocal cords under direct vision,  breath sounds checked- equal and bilateral and positive ETCO2 Secured at: 23 cm Tube secured with: Tape Dental Injury: Teeth and Oropharynx as per pre-operative assessment

## 2012-09-15 NOTE — Progress Notes (Signed)
Subjective: POD#0 from endoscopic sinus surgery for polyposis, had post-op desat's and has been on oxygen. Admitted for O2 and pulmonary toilet.   Objective: Vital signs in last 24 hours: Temp:  [97 F (36.1 C)-98.7 F (37.1 C)] 98.7 F (37.1 C) (08/08 1625) Pulse Rate:  [96-108] 102 (08/08 1625) Resp:  [20-35] 29 (08/08 1625) BP: (100-134)/(65-90) 116/77 mmHg (08/08 1625) SpO2:  [89 %-97 %] 97 % (08/08 1625) FiO2 (%):  [35 %] 35 % (08/08 1625) Weight:  [104.6 kg (230 lb 9.6 oz)] 104.6 kg (230 lb 9.6 oz) (08/08 1625)  Somewhat raspy voice but no stridor, no epistaxis, CN 2-12 intact and EOMI, PERRLA  @LABLAST2 (wbc:2,hgb:2,hct:2,plt:2) No results found for this basename: NA, K, CL, CO2, GLUCOSE, BUN, CREATININE, CALCIUM,  in the last 72 hours  Medications:  Scheduled Meds: . albuterol      . albuterol      . cephALEXin  500 mg Oral Q12H  . HYDROmorphone      . [START ON 09/16/2012] insulin aspart  0-15 Units Subcutaneous TID WC  . insulin aspart  0-5 Units Subcutaneous QHS  . mometasone-formoterol  2 puff Inhalation BID   Continuous Infusions: . dextrose 5 % and 0.9% NaCl     PRN Meds:.albuterol, diphenhydrAMINE, HYDROcodone-acetaminophen, ibuprofen, morphine injection, promethazine, promethazine  Assessment/Plan: Patient states decadron allergy but has had flonase before without reactionso I added mometasone inhaled and PRN albuterol, is on keflex PRN pain medications. Wean oxygen as tolerated. Will monitor in stepdown, discharge when off oxygen. There is suspicion for possible sarcoidosis. Sliding scale insulin.   LOS: 0 days   Melvenia Beam 09/15/2012, 6:40 PM

## 2012-09-15 NOTE — Preoperative (Signed)
Beta Blockers   Reason not to administer Beta Blockers:Not Applicable 

## 2012-09-15 NOTE — Consult Note (Addendum)
PULMONARY  / CRITICAL CARE MEDICINE  Name: Tanya Hardy MRN: [id number] DOB: 29-Nov-1980    ADMISSION DATE:  09/15/2012 CONSULTATION DATE:  09/15/2012  REFERRING MD :   PRIMARY SERVICE:    CHIEF COMPLAINT:  Chronic sinusitis  BRIEF PATIENT DESCRIPTION: 32 yo F with hx of DM and chronic sinusitis presents to [facility] for on 09/15/12 for bilateral sinus surgery for chronic sinusitis. Postop patient became hypoxic, dyspneic and was transiently on BiPAP so PCCM was consulted. Seen 05/2012 by Dr Sherene Sires with presumed sarcoidosis. FOB could not be performed due to bilateral nasal obstruction and pt could not tolerate oral approach. Dr Sherene Sires referred to Dr Pollyann Kennedy  SIGNIFICANT EVENTS / STUDIES:  8/8 - bilateral sinus surgery 8/8 - admission due to post op dyspnea and hypoxemia  LINES / TUBES:   CULTURES:   ANTIBIOTICS:   HISTORY OF PRESENT ILLNESS: See pt profile above. Her resp symptoms started around 12/13. Prior to that, she denies any resp symptoms including DOE, cough, CP, LE edema. Her initial pulmonary eval was in April of this yr and her CXR and CT chest were highly suggestive of pulmonary sarcoidosis. As described above, she could not tolerate FOB. At her baseline, she has moderate DOE and struggles to complete a single flight of stairs. She denies CP, hemoptysis, orthopnea, PND, LE edema and calf tenderness. She does have intermittent NP cough.  PAST MEDICAL HISTORY :  Past Medical History  Diagnosis Date  . Complication of anesthesia     anesthesia not strong enough, fought during surgery  . Diabetes mellitus without complication     diet controlled  . Depression 2011    treated at the time, no longer on meds  . Shortness of breath     going upstairs, being worked up for sarcoidoisi  . Chronic sinusitis   . Headache(784.0)     migraines (rare now)   Past Surgical History  Procedure Laterality Date  . Video bronchoscopy Bilateral 05/15/2012    Procedure: VIDEO BRONCHOSCOPY WITH  FLUORO;  Surgeon: Nyoka Cowden, MD;  Location: [facility];  Service: Cardiopulmonary;  Laterality: Bilateral;   Prior to Admission medications   Medication Sig Start Date End Date Taking? Authorizing Provider  cephALEXin (KEFLEX) 500 MG capsule Take 1 capsule (500 mg total) by mouth 3 (three) times daily. 09/15/12   Serena Colonel, MD  HYDROcodone-acetaminophen (NORCO) 7.5-325 MG per tablet Take 1 tablet by mouth every 6 (six) hours as needed for pain. 09/15/12   Serena Colonel, MD  promethazine (PHENERGAN) 25 MG suppository Place 1 suppository (25 mg total) rectally every 6 (six) hours as needed for nausea. 09/15/12   Serena Colonel, MD   Allergies  Allergen Reactions  . Dexamethasone Sodium Phosphate Other (See Comments)    Pt states after receiving injection had severe nosebleeds, bruising, blood blister on lip    FAMILY HISTORY:  Family History  Problem Relation Age of Onset  . Sarcoidosis Brother   . Brain cancer Paternal Grandfather   . Colon cancer Paternal Aunt    SOCIAL HISTORY:  reports that she quit smoking about 5 months ago. Her smoking use included Cigarettes. She has a 2.75 pack-year smoking history. She has never used smokeless tobacco. She reports that  drinks alcohol. She reports that she does not use illicit drugs.  REVIEW OF SYSTEMS:   Constitutional: Negative for fever, chills, weight loss, malaise/fatigue and diaphoresis.  HENT: Negative for hearing loss, ear pain, nosebleeds, congestion, sore throat, neck pain, tinnitus and ear  discharge.   Eyes: Negative for blurred vision, double vision, photophobia, pain, discharge and redness.  Respiratory: as above   Cardiovascular: Negative for chest pain, palpitations, orthopnea, claudication, leg swelling and PND.  Gastrointestinal: Negative for heartburn, nausea, vomiting, abdominal pain, diarrhea, constipation, blood in stool and melena.  Genitourinary: Negative for dysuria, urgency, frequency, hematuria and flank pain.   Musculoskeletal: Negative for myalgias, back pain, joint pain and falls.  Skin: Negative for itching and rash.  Neurological: Negative for dizziness, tingling, tremors, sensory change, speech change, focal weakness, seizures, loss of consciousness, weakness and headaches.  Endo/Heme/Allergies: Negative for environmental allergies and polydipsia. Does not bruise/bleed easily.  SUBJECTIVE: Patient alert and oriented, responsive to questions.  VITAL SIGNS: Temp:  [97 F (36.1 C)-97.7 F (36.5 C)] 97.7 F (36.5 C) (08/08 1415) Pulse Rate:  [96-108] 97 (08/08 1457) Resp:  [20-35] 31 (08/08 1457) BP: (118-134)/(67-87) 134/67 mmHg (08/08 1445) SpO2:  [91 %-96 %] 91 % (08/08 1457) FiO2 (%):  [35 %] 35 % (08/08 1457)  PHYSICAL EXAMINATION: General:  WDWN female, in NAD  Neuro:  No focal neuro deficits HEENT:  Trachea midline, no JVD, PEERLA Cardiovascular:  RRR, no m/r/g Lungs:  Diffuse coarse wheezes Abdomen:  Nontender, non-distended, BS+ Ext: no edema Skin:  intact  No results found for this basename: NA, K, CL, CO2, BUN, CREATININE, GLUCOSE,  in the last 168 hours  Recent Labs Lab 09/11/12 1310  HGB 13.9  HCT 42.1  WBC 5.2  PLT 300   CXR: bilateral hilar fullness, diffuse reticulonodular infiltrates with increased confluence in bases compared to previously  ASSESSMENT / PLAN:  PULMONOLOGY  A: Post op resp failure with hypoxia 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 I have formulated the assessment and plan as reflected in the note above with amendments made by me.  She will likely be able to be discharged  in a day or two. While we await the surgical path (that will likely confirm the diagnosis of sarcoidosis) I think she should be discharged on prednisone with a taper and plans to f/u with Dr Sherene Sires. I suggest Pred 60 mg/d X 1 wk, Pred 50 mg/d X 1 wk, Pred 40 mg/d X 1 wk, Pred 30 mg/d X 1 wk, Pred 20 mg/d and hold at that dose until re-eval with CXR, office ROV. Hopefully she can be tapered to < 10 mg/day as she is at high risk of steroids induced obesity and DM.   I will have one of my colleagues see her this WE  Billy Fischer, MD;  PCCM service; Mobile [phone] Pulmonary and Critical Care Medicine [facility] Pager: [phone]  09/15/2012, 3:06 PM   ADD: I have scheduled ROV with Dr Sherene Sires Caleen Essex 8/29 @ 2:15 PM  Billy Fischer, MD ; [facility] [place] [phone].  After 5:30 PM or weekends, call [phone]

## 2012-09-15 NOTE — Anesthesia Preprocedure Evaluation (Addendum)
Anesthesia Evaluation  Patient identified by MRN, date of birth, ID band Patient awake    Reviewed: Allergy & Precautions, H&P , NPO status , Patient's Chart, lab work & pertinent test results  Airway Mallampati: II TM Distance: >3 FB Neck ROM: Full    Dental no notable dental hx. (+) Teeth Intact and Dental Advisory Given   Pulmonary neg pulmonary ROS, shortness of breath,  breath sounds clear to auscultation  Pulmonary exam normal       Cardiovascular negative cardio ROS  Rhythm:Regular Rate:Normal     Neuro/Psych  Headaches, PSYCHIATRIC DISORDERS    GI/Hepatic negative GI ROS, Neg liver ROS,   Endo/Other  diabetesMorbid obesity  Renal/GU negative Renal ROS  negative genitourinary   Musculoskeletal   Abdominal   Peds  Hematology negative hematology ROS (+)   Anesthesia Other Findings   Reproductive/Obstetrics negative OB ROS                          Anesthesia Physical Anesthesia Plan  ASA: III  Anesthesia Plan: General   Post-op Pain Management:    Induction: Intravenous  Airway Management Planned: Oral ETT  Additional Equipment:   Intra-op Plan:   Post-operative Plan: Extubation in OR  Informed Consent: I have reviewed the patients History and Physical, chart, labs and discussed the procedure including the risks, benefits and alternatives for the proposed anesthesia with the patient or authorized representative who has indicated his/her understanding and acceptance.   Dental advisory given  Plan Discussed with: CRNA  Anesthesia Plan Comments:         Anesthesia Quick Evaluation

## 2012-09-15 NOTE — Progress Notes (Signed)
Dr Melvenia Beam in to see pt..removed bi-pap and o2 via humidifed mask at 35% applied as ordered..,pt tolerating well with o2 sats=96-97

## 2012-09-15 NOTE — Interval H&P Note (Signed)
History and Physical Interval Note:  09/15/2012 11:23 AM  Tanya Hardy  has presented today for surgery, with the diagnosis of Chronic sinistitis  The various methods of treatment have been discussed with the patient and family. After consideration of risks, benefits and other options for treatment, the patient has consented to  Procedure(s): BILATERAL ENDOSCOPIC MAXILLARY ANTROSTOMY/ETHMOIDECTOMY/FRONTAL SINUS SURGERY (N/A) as a surgical intervention .  The patient's history has been reviewed, patient examined, no change in status, stable for surgery.  I have reviewed the patient's chart and labs.  Questions were answered to the patient's satisfaction.     Lalla Laham

## 2012-09-15 NOTE — Progress Notes (Signed)
Report given to elise rn as caregiver 

## 2012-09-15 NOTE — Progress Notes (Signed)
Received pt from OR RN placed on PC BIPAP per MD RT to monitor

## 2012-09-15 NOTE — Transfer of Care (Signed)
Immediate Anesthesia Transfer of Care Note  Patient: Tanya Hardy  Procedure(s) Performed: Procedure(s): BILATERAL ENDOSCOPIC MAXILLARY ANTROSTOMY/ETHMOIDECTOMY/FRONTAL SINUS SURGERY and sphenoidotomy (N/A)  Patient Location: PACU  Anesthesia Type:General  Level of Consciousness: awake, oriented and patient cooperative  Airway & Oxygen Therapy: Patient Spontanous Breathing and placed on BiPap peep 6 Pressure 13 100% O2  Post-op Assessment: Report given to PACU RN, Post -op Vital signs reviewed and stable and Patient moving all extremities X 4  Post vital signs: Reviewed and stable  Complications: No apparent anesthesia complications

## 2012-09-15 NOTE — Op Note (Signed)
OPERATIVE REPORT  DATE OF SURGERY: 09/15/2012  PATIENT:  Tanya Hardy,  32 y.o. female  PRE-OPERATIVE DIAGNOSIS:  Chronic sinistitis  POST-OPERATIVE DIAGNOSIS:  Chronic sinistitis  PROCEDURE:  Procedure(s): BILATERAL ENDOSCOPIC MAXILLARY ANTROSTOMY/ETHMOIDECTOMY/FRONTAL SINUS SURGERY and sphenoidotomy  SURGEON:  Susy Frizzle, MD  ASSISTANTS: none  ANESTHESIA:   General   EBL:  50 ml  DRAINS: none  LOCAL MEDICATIONS USED:  1% Xylocaine with epi  SPECIMEN:  Bilateral sinus contents  COUNTS:  Correct  PROCEDURE DETAILS: The patient was taken to the operating room and placed on the operating table in the supine position. Following induction of general endotracheal anesthesia the face was prepped and draped in a standard fashion. Afrin spray was used preoperatively in the nasal cavities. 1% Xylocaine with epinephrine was infiltrated into the superior and posterior attachments of the middle turbinates. The lateral nasal wall was infiltrated as well. The septum up high was infiltrated on the left side to facilitate exposure of the left middle turbinate. Afrin pledgets were placed bilaterally prior to initiation of each side.  Bilateral total endoscopic ethmoidectomy. Right side performed first, left side second. Similar procedure bilaterally. Middle meatus identified with the 0 endoscope, polypoid disease present bilaterally, worse on the right. Microdebrider used to open up the infundibulum, uncinated and bulla. Complete ethmoid dissection completed. The fovea kept intact superiorly. Lamina papyracea intact laterally. Ground lamella taken down to expose posterior cells. Diffuse polypoid disease present bilaterally. Similar procedure performed bilaterally. Frontal recess with polypoid disease also cleaned out. 30 endoscope and angled forceps were used for the upper aspects of the dissection. Frontal sinuses are underdeveloped.  Bilateral endoscopic maxillary antrostomy with removal of  polyps. 30 scope and curved suction used to inspect the fontanelle and the maxillary sinus entered bilaterally. Backbiting forcep used to increase the antrostomy size anteriorly and straight Wilde-Blakesley forceps used posteriorly. Polypoid disease present bilaterally, removed using suction and angled forceps.  Right endoscopic sphenoidotomy. Sphenoid ostium entered on the right side using a straight suction and 0 endoscope. Antrostomy enlarged using the microdebrider. Large amount of polypoid disease removed with straight forceps. Frontal recess inspected on the left, very hard bone covering the face of the sphenoid. Attempts were not made to fracture through this bone because of potential risks of doing that. Left sphenoidotomy not completed.  Ethmoid cavities packed with NasalPore bilaterally. Nasal cavities and pharynx suctioned of blood and secretions. Patient awakened, extubated and transferred to recovery in stable condition.    PATIENT DISPOSITION:  To PACU, stable

## 2012-09-16 ENCOUNTER — Encounter (HOSPITAL_COMMUNITY): Payer: Self-pay | Admitting: Internal Medicine

## 2012-09-16 DIAGNOSIS — R0602 Shortness of breath: Secondary | ICD-10-CM

## 2012-09-16 DIAGNOSIS — I441 Atrioventricular block, second degree: Secondary | ICD-10-CM

## 2012-09-16 LAB — GLUCOSE, CAPILLARY
Glucose-Capillary: 118 mg/dL — ABNORMAL HIGH (ref 70–99)
Glucose-Capillary: 167 mg/dL — ABNORMAL HIGH (ref 70–99)

## 2012-09-16 MED ORDER — MUPIROCIN 2 % EX OINT
1.0000 "application " | TOPICAL_OINTMENT | Freq: Two times a day (BID) | CUTANEOUS | Status: DC
  Administered 2012-09-16 (×2): 1 via NASAL
  Filled 2012-09-16: qty 22

## 2012-09-16 MED ORDER — CHLORHEXIDINE GLUCONATE CLOTH 2 % EX PADS
6.0000 | MEDICATED_PAD | Freq: Every day | CUTANEOUS | Status: DC
  Administered 2012-09-16 – 2012-09-17 (×2): 6 via TOPICAL

## 2012-09-16 MED ORDER — PREDNISONE 10 MG PO TABS
10.0000 mg | ORAL_TABLET | Freq: Once | ORAL | Status: AC
  Administered 2012-09-16: 10 mg via ORAL
  Filled 2012-09-16: qty 1

## 2012-09-16 NOTE — Progress Notes (Addendum)
Subjective: POD#1 from endoscopic sinus surgery for polyposis, O2 sats better (86-87% on room air)  Objective: Vital signs in last 24 hours: Temp:  [97.7 F (36.5 C)-99.1 F (37.3 C)] 98.5 F (36.9 C) (08/09 0803) Pulse Rate:  [79-107] 81 (08/09 0810) Resp:  [16-35] 17 (08/09 0810) BP: (95-134)/(54-90) 105/64 mmHg (08/09 0735) SpO2:  [89 %-98 %] 96 % (08/09 0810) FiO2 (%):  [28 %-35 %] 28 % (08/09 0803) Weight:  [104.6 kg (230 lb 9.6 oz)] 104.6 kg (230 lb 9.6 oz) (08/08 1625)  NAD, no stridor, minimal nasal bleeding, EOMI, PERRLA, oral cavity clear  @LABLAST2 (wbc:2,hgb:2,hct:2,plt:2) No results found for this basename: NA, K, CL, CO2, GLUCOSE, BUN, CREATININE, CALCIUM,  in the last 72 hours  Medications:  Scheduled Meds: . cephALEXin  500 mg Oral Q12H  . Chlorhexidine Gluconate Cloth  6 each Topical Q0600  . insulin aspart  0-15 Units Subcutaneous TID WC  . insulin aspart  0-5 Units Subcutaneous QHS  . mometasone-formoterol  2 puff Inhalation BID  . mupirocin ointment  1 application Nasal BID  . predniSONE  10 mg Oral Once   Continuous Infusions: . dextrose 5 % and 0.9% NaCl 75 mL/hr at 09/16/12 0604   PRN Meds:.albuterol, diphenhydrAMINE, HYDROcodone-acetaminophen, ibuprofen, morphine injection, promethazine, promethazine  Assessment/Plan: Improving POD#1 from sinus surgery, continue steroid inhaler, antibiotics. Pulmonary checking ACE, CXR in the am so will monitor until at least tomorrow, will give test dose of prednisone 10mg  to see if she has reaction (she has reported allergy (bleeding) with dexamethasone).   LOS: 1 day   Melvenia Beam 09/16/2012, 10:48 AM   RN called states patient having some telemetry skipped beats or heart block. Will check stat EKG and consult [facility] on call.

## 2012-09-16 NOTE — Progress Notes (Signed)
PULMONARY  / CRITICAL CARE MEDICINE  Name: Tanya Hardy MRN: [id number] DOB: 03/18/1980    ADMISSION DATE:  09/15/2012 CONSULTATION DATE:  09/15/2012  REFERRING Hardy :   PRIMARY SERVICE:    CHIEF COMPLAINT:  Chronic sinusitis  BRIEF PATIENT DESCRIPTION: 32 yo F with hx of DM and chronic sinusitis presents to [facility] for on 09/15/12 for bilateral sinus surgery for chronic sinusitis. Postop patient became hypoxic, dyspneic and was transiently on BiPAP so PCCM was consulted. Seen 05/2012 by Tanya Hardy with presumed sarcoidosis. FOB could not be performed due to bilateral nasal obstruction and pt could not tolerate oral approach. Tanya Hardy referred to Tanya Hardy  SIGNIFICANT EVENTS / STUDIES:  8/8 - bilateral sinus surgery 8/8 - admission due to post op dyspnea and hypoxemia  LINES / TUBES:  CULTURES:  ANTIBIOTICS:   HISTORY OF PRESENT ILLNESS: See pt profile above. Her resp symptoms started around 12/13. Prior to that, she denies any resp symptoms including DOE, cough, CP, LE edema. Her initial pulmonary eval was in April of this yr and her CXR and CT chest were highly suggestive of pulmonary sarcoidosis. As described above, she could not tolerate FOB. At her baseline, she has moderate DOE and struggles to complete a single flight of stairs. She denies CP, hemoptysis, orthopnea, PND, LE edema and calf tenderness. She does have intermittent NP cough.  PAST MEDICAL HISTORY :  Past Medical History  Diagnosis Date  . Complication of anesthesia     anesthesia not strong enough, fought during surgery  . Diabetes mellitus without complication     diet controlled  . Depression 2011    treated at the time, no longer on meds  . Shortness of breath     going upstairs, being worked up for sarcoidoisi  . Chronic sinusitis   . Headache(784.0)     migraines (rare now)   Past Surgical History  Procedure Laterality Date  . Video bronchoscopy Bilateral 05/15/2012    Procedure: VIDEO BRONCHOSCOPY WITH FLUORO;   Surgeon: Tanya Cowden, Hardy;  Location: [facility];  Service: Cardiopulmonary;  Laterality: Bilateral;   Prior to Admission medications   Medication Sig Start Date End Date Taking? Authorizing Provider  cephALEXin (KEFLEX) 500 MG capsule Take 1 capsule (500 mg total) by mouth 3 (three) times daily. 09/15/12   Tanya Hardy  HYDROcodone-acetaminophen (NORCO) 7.5-325 MG per tablet Take 1 tablet by mouth every 6 (six) hours as needed for pain. 09/15/12   Tanya Hardy  promethazine (PHENERGAN) 25 MG suppository Place 1 suppository (25 mg total) rectally every 6 (six) hours as needed for nausea. 09/15/12   Tanya Hardy   Allergies  Allergen Reactions  . Dexamethasone Sodium Phosphate Other (See Comments)    Pt states after receiving injection had severe nosebleeds, bruising, blood blister on lip     SUBJECTIVE: Patient alert and oriented, responsive to questions.  VITAL SIGNS: Temp:  [97 F (36.1 C)-99.1 F (37.3 C)] 98.5 F (36.9 C) (08/09 0803) Pulse Rate:  [79-108] 93 (08/09 0750) Resp:  [16-35] 22 (08/09 0750) BP: (95-134)/(54-90) 105/64 mmHg (08/09 0735) SpO2:  [89 %-98 %] 94 % (08/09 0803) FiO2 (%):  [28 %-35 %] 28 % (08/09 0803) Weight:  [104.6 kg (230 lb 9.6 oz)] 104.6 kg (230 lb 9.6 oz) (08/08 1625)  PHYSICAL EXAMINATION: General:  WDWN female, in NAD  Neuro:  No focal neuro deficits HEENT:  Trachea midline, no JVD, PEERLA Cardiovascular:  RRR, no m/r/g Lungs:  Diffuse  coarse wheezes Abdomen:  Nontender, non-distended, BS+ Ext: no edema Skin:  intact  No results found for this basename: NA, K, CL, CO2, BUN, CREATININE, GLUCOSE,  in the last 168 hours  Recent Labs Lab 09/11/12 1310  HGB 13.9  HCT 42.1  WBC 5.2  PLT 300   CXR: bilateral hilar fullness, diffuse reticulonodular infiltrates with increased confluence in bases compared to previously   ASSESSMENT / PLAN:  PULMONOLOGY  A: Post op resp failure with hypoxia 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 While we await the surgical path (that will likely confirm the diagnosis of sarcoidosis) I think she should be discharged on prednisone with a taper and plans to f/u with Tanya Hardy. I suggest Pred 60 mg/d X 1 wk, Pred 50 mg/d X 1 wk, Pred 40 mg/d X 1 wk, Pred 30 mg/d X 1 wk, Pred 20 mg/d and hold at that dose until re-eval with CXR, office ROV. Hopefully she can be tapered to < 10 mg/day as she is at high risk of steroids induced obesity and DM.  ADD: scheduled ROV with Tanya Hardy Caleen Essex 8/29 @ 2:15 PM   Tanya Lamke M. Kriste Basque, [facility] 09/16/2012, 9:23 AM

## 2012-09-16 NOTE — Progress Notes (Addendum)
Dr. Emeline Darling notified that pt has been having some recent episodes of what looks to be a Mobitz I block. Plan of care -- obtain EKG and Dr. Emeline Darling plans to consult cardiology. Renette Butters, Viona Gilmore

## 2012-09-16 NOTE — Consult Note (Signed)
CARDIOLOGY CONSULT NOTE      Primary Care Physician: No primary provider on file. Referring Physician:  Dr Pollyann Kennedy  Admit Date: 09/15/2012  Reason for consultation:  Abnormal heart rhythm  Tanya Hardy is a 32 y.o. female with a h/o obesity and possible pulmonary sarcoidosis s/p sinus surgery for polyposis.  She is making good recovery.  She has SOB with decreased O2 sats due to her chronic lung disease.  On telemetry today, she has had mobitz I second degree AV block.  Cardiology is therefore consulted. She is asymptomatic with these episodes.  Today, she denies symptoms of palpitations, chest pain, lower extremity edema, dizziness, presyncope, syncope, or neurologic sequela. The patient is tolerating medications without difficulties and is otherwise without complaint today.   Past Medical History  Diagnosis Date  . Complication of anesthesia     anesthesia not strong enough, fought during surgery  . Diabetes mellitus without complication     diet controlled  . Depression 2011    treated at the time, no longer on meds  . Shortness of breath     going upstairs, being worked up for sarcoidoisi  . Chronic sinusitis   . Headache(784.0)     migraines (rare now)  . Obesity    Past Surgical History  Procedure Laterality Date  . Video bronchoscopy Bilateral 05/15/2012    Procedure: VIDEO BRONCHOSCOPY WITH FLUORO;  Surgeon: Nyoka Cowden, MD;  Location: [facility];  Service: Cardiopulmonary;  Laterality: Bilateral;    . cephALEXin  500 mg Oral Q12H  . Chlorhexidine Gluconate Cloth  6 each Topical Q0600  . insulin aspart  0-15 Units Subcutaneous TID WC  . insulin aspart  0-5 Units Subcutaneous QHS  . mometasone-formoterol  2 puff Inhalation BID  . mupirocin ointment  1 application Nasal BID   . dextrose 5 % and 0.9% NaCl Stopped (09/16/12 1230)    Allergies  Allergen Reactions  . Dexamethasone Sodium Phosphate Other (See Comments)    Pt states after receiving injection had  severe nosebleeds, bruising, blood blister on lip    History   Social History  . Marital Status: Married    Spouse Name: N/A    Number of Children: 0  . Years of Education: N/A   Occupational History  . Works at a [employer]    Social History Main Topics  . Smoking status: Former Smoker -- 0.25 packs/day for 11 years    Types: Cigarettes    Quit date: 04/07/2012  . Smokeless tobacco: Never Used  . Alcohol Use: Yes     Comment: occasional  . Drug Use: No  . Sexually Active: Yes     Comment: trying to get pregnant   Other Topics Concern  . Not on file   Social History Narrative  . No narrative on file    Family History  Problem Relation Age of Onset  . Sarcoidosis Brother   . Brain cancer Paternal Grandfather   . Colon cancer Paternal Aunt     ROS- All systems are reviewed and negative except as per the HPI above  Physical Exam: Telemetry: sinus rhythm with occasional mobitz I second degree AV block Filed Vitals:   09/16/12 0810 09/16/12 1230 09/16/12 1545 09/16/12 1550  BP:  117/63  126/74  Pulse: 81 92 90 92  Temp:  98.6 F (37 C)  98.8 F (37.1 C)  TempSrc:  Oral  Oral  Resp: 17 21 36 23  Height:      Weight:  SpO2: 96% 92% 86% 96%    GEN- The patient is morbidly obese appearing, alert and oriented x 3 today.   Head- normocephalic, atraumatic Eyes-  Sclera clear, conjunctiva pink Ears- hearing intact Oropharynx- clear Neck- supple,  Lungs- Clear to ausculation bilaterally, normal work of breathing Heart- Regular rate and rhythm, no murmurs, rubs or gallops, PMI not laterally displaced GI- soft, NT, ND, + BS Extremities- no clubbing, cyanosis, or edema MS- no significant deformity or atrophy Skin- no rash or lesion Psych- euthymic mood, full affect Neuro- strength and sensation are intact  EKG: reveals sinus rhythm 83 bpm, PR 144, QRS 84, QTc CXR reviewed  Labs:   Lab Results  Component Value Date   WBC 5.2 09/11/2012   HGB  13.9 09/11/2012   HCT 42.1 09/11/2012   MCV 84.5 09/11/2012   PLT 300 09/11/2012   No results found for this basename: NA, K, CL, CO2, BUN, CREATININE, CALCIUM, LABALBU, PROT, BILITOT, ALKPHOS, ALT, AST, GLUCOSE,  in the last 168 hours Lab Results  Component Value Date   TROPONINI <0.30 03/27/2012     ASSESSMENT AND PLAN:   1. Mobitz I second degree AV block This is a benign finding and likely due to increased vagal tone related to surgery, pain, sleeping, etc.  She is completely asymptomatic.  No further workup is indicated at this time.  2. SOB/ pulmonary sarcoidosis Given concerns of SOB and sarcoidosis, I have advised that she have a baseline echocardiogram to evaluate for cardiac sarcoid, or cardiac issues secondary to her lung disease such as pulmonary hypertension.  She is clear at this time in her decision to decline echocardiogram.  She will discuss this further with Dr Sherene Sires.  This could be performed as an outpatient if she decides to have the study.  No further inpatient CV testing is planned.  No outpatient CV followup required.  She will follow-up with Dr Sherene Sires. I will see as needed while here. Please call with questions.   Hillis Range, MD 09/16/2012  5:32 PM

## 2012-09-17 ENCOUNTER — Inpatient Hospital Stay (HOSPITAL_COMMUNITY): Payer: PRIVATE HEALTH INSURANCE

## 2012-09-17 LAB — COMPREHENSIVE METABOLIC PANEL
ALT: 9 U/L (ref 0–35)
AST: 19 U/L (ref 0–37)
Alkaline Phosphatase: 106 U/L (ref 39–117)
CO2: 24 mEq/L (ref 19–32)
Calcium: 9.9 mg/dL (ref 8.4–10.5)
Chloride: 101 mEq/L (ref 96–112)
GFR calc non Af Amer: 84 mL/min — ABNORMAL LOW (ref >90)
Potassium: 4.1 mEq/L (ref 3.5–5.1)
Sodium: 137 mEq/L (ref 135–145)

## 2012-09-17 LAB — CBC
Hemoglobin: 12.1 g/dL (ref 12.0–15.0)
MCH: 27.6 pg (ref 26.0–34.0)
Platelets: 283 10*3/uL (ref 150–400)
RBC: 4.39 MIL/uL (ref 3.87–5.11)
WBC: 4.8 10*3/uL (ref 4.0–10.5)

## 2012-09-17 NOTE — Progress Notes (Signed)
Pt d/c home per MD order, d/c instructions given, pt VSS, pt verbalized understanding of tx, all questions answered

## 2012-09-17 NOTE — Discharge Summary (Addendum)
09/17/2012  9:03 AM  Date of Admission:09/15/2012 Date of Discharge:09/17/2012  Discharge WJ:XBJY, Clovis Riley, MD  Admitting NW:GNFAO Pollyann Kennedy, MD  Reason for admission/final discharge diagnosis: dyspnea, sinonasal polyposis, chronic sinusitis, hypoxia  Labs:see EPIC  Procedure(s) performed: endoscopic sinus surgery 09/15/12  Discharge Condition:improved  Discharge Exam:nose hemostatic, CN 2-12 intact, EOMI, PERRLA  Discharge Instructions: No nose blowing x 2 weeks, follow up with Dr. Pollyann Kennedy in 1 week, Rx on front of chart, regular diet as tolerated, check your blood sugars regularly while on prednisone, up ad lib no strenuous activity. Work note is on chart.  Hospital Course: taken for endoscopic sinus surgery 09/15/12. Had some post-op hypoxia likely due to chronic lung disease/possible sarcoidosis. Admitted for oxygen and pulmonary toilet. Had some type 1 asymptomatic heart block, seen by cariology who recommended EKG which was sinus rhythm with sinus arrythmia, and suggested echo but patient refused, cardiology recommended no need for further inpt or outpt workup. sats and pulmonary status improved. Patient tolerated inhalers and test dose of prednisone so discharged on POD#2 in good condition.  Melvenia Beam 9:03 AM 09/17/2012

## 2012-09-18 ENCOUNTER — Encounter (HOSPITAL_COMMUNITY): Payer: Self-pay | Admitting: Otolaryngology

## 2012-09-18 NOTE — Anesthesia Postprocedure Evaluation (Signed)
  Anesthesia Post-op Note  Patient: Tanya Hardy  Procedure(s) Performed: Procedure(s): BILATERAL ENDOSCOPIC MAXILLARY ANTROSTOMY/ETHMOIDECTOMY/FRONTAL SINUS SURGERY and sphenoidotomy (N/A)  Patient Location: PACU  Anesthesia Type:General  Level of Consciousness: awake and alert   Airway and Oxygen Therapy: Patient Spontanous Breathing  Post-op Pain: none  Post-op Assessment: Post-op Vital signs reviewed, Patient's Cardiovascular Status Stable, Respiratory Function Stable, Patent Airway and No signs of Nausea or vomiting  Post-op Vital Signs: Reviewed and stable  Complications: No apparent anesthesia complications

## 2012-09-18 NOTE — Progress Notes (Signed)
Utilization review completed.  

## 2012-10-06 ENCOUNTER — Ambulatory Visit (INDEPENDENT_AMBULATORY_CARE_PROVIDER_SITE_OTHER): Payer: PRIVATE HEALTH INSURANCE | Admitting: Internal Medicine

## 2012-10-06 ENCOUNTER — Encounter: Payer: Self-pay | Admitting: Internal Medicine

## 2012-10-06 VITALS — BP 132/86 | HR 99 | Temp 98.2°F | Ht 62.0 in | Wt 227.8 lb

## 2012-10-06 DIAGNOSIS — R599 Enlarged lymph nodes, unspecified: Secondary | ICD-10-CM

## 2012-10-06 DIAGNOSIS — D869 Sarcoidosis, unspecified: Secondary | ICD-10-CM | POA: Insufficient documentation

## 2012-10-06 DIAGNOSIS — R590 Localized enlarged lymph nodes: Secondary | ICD-10-CM

## 2012-10-06 MED ORDER — BUDESONIDE-FORMOTEROL FUMARATE 80-4.5 MCG/ACT IN AERO: 2.0000 | INHALATION_SPRAY | Freq: Two times a day (BID) | RESPIRATORY_TRACT | Status: DC

## 2012-10-06 NOTE — Progress Notes (Signed)
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   rx with abx and flonase no better.  rec FOB  FOB  05/15/12  not able to do because of nasal obstuction and couldn't tolerate oral route> rec ENT eval   10/06/2012   Post hosp f/u /Chen Saadeh re dx of sarcoid made by ent 8/18 and rx pred by [facility] Chief Complaint  Patient presents with  . Hospitalization Follow-up    Breathing has improved since discharge.    finished pred 8/17 and since then 2 days prior to ov noted cough with deep breath.   No obvious daytime variabilty or assoc   cp or chest tightness, subjective wheeze overt or hb symptoms. No unusual exp hx or h/o childhood pna/ asthma or premature birth to her knowledge.   No h/o asa intolerance.  Also denies any obvious fluctuation of symptoms with weather or environmental changes or other aggravating or alleviating factors except as outlined above    Current Medications, Allergies, Past Medical History, Past Surgical History, Family History, and Social History were reviewed in [employer] record.       ROS  The following are not active complaints unless bolded sore throat, dysphagia, dental problems, itching, sneezing,  nasal congestion from dissolvable gauze or excess/ purulent secretions, ear ache,   fever, chills, sweats, unintended wt loss, pleuritic or exertional cp, hemoptysis,  orthopnea pnd or leg swelling,  presyncope, palpitations, heartburn, abdominal pain, anorexia, nausea, vomiting, diarrhea  or change in bowel or urinary habits, change in stools or urine, dysuria,hematuria,  rash, arthralgias, visual complaints, headache, numbness weakness or ataxia or problems with walking or coordination,  change in mood/affect or memory.              Objective:   Physical Exam  amb hoarse mod obese  bf nad with mild nasal tone to voice   05/24/2012   225 Wt Readings from Last 3 Encounters:  10/06/12 227 lb 12.8 oz (103.329 kg)  09/15/12 230 lb 9.6 oz (104.6 kg)  09/15/12 230 lb 9.6 oz (104.6 kg)       HEENT: nl dentition,   and orophanx. Nl external ear canals without cough reflex. Nose Still has gauze packing bilaterally  NECK :  without JVD/Nodes/TM/ nl carotid upstrokes bilaterally   LUNGS: no acc muscle use, clear to A and P bilaterally without cough on insp or exp maneuvers   CV:  RRR  no s3 or murmur or increase in P2, no edema   ABD:  soft and nontender with nl excursion in the supine position. No bruits or organomegaly, bowel sounds nl  MS:  warm without deformities, calf tenderness, cyanosis or clubbing  SKIN: warm and dry without lesions       cxr 03/27/12 Widespread interstitial disease of uncertain etiology or  chronicity. In the acute setting, atypical  pneumonia or allergic  type phenomenon are possibilities.  Vs pcxr 09/17/12 Interval improvement in bilateral airspace disease     Assessment:

## 2012-10-06 NOTE — Patient Instructions (Addendum)
Symbicort 80 Take 2 puffs first thing in am and then another 2 puffs about 12 hours later.     Please schedule a follow up office visit in 4 weeks, call sooner if needed with cxr on return

## 2012-10-07 NOTE — Assessment & Plan Note (Signed)
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   I had an extended discussion with the patient today lasting 15 to 20 minutes of a 25 minute visit on the following issues:   The goal with a chronic steroid dependent illness is always arriving at the lowest effective dose that controls the disease/symptoms and not accepting a set "formula" which is based on statistics or guidelines that don't always take into account patient  variability or the natural hx of the dz in every individual patient, which may well vary over time.  For now therefore I recommend the patient maintain  Off any systemic rx as long as symptoms can be controlled on topical rx > defer nasal rx to ent but for lower airways: symbicort 80 2bid  The proper method of use, as well as anticipated side effects, of a metered-dose inhaler are discussed and demonstrated to the patient. Improved effectiveness after extensive coaching during this visit to a level of approximately  90%   rx symbicort 80 Take 2 puffs first thing in am and then another 2 puffs about 12 hours later.

## 2012-10-07 NOTE — Assessment & Plan Note (Signed)
-   See CT chest 03/27/12> fob  05/15/12 could not pass fob > nasal bx 09/15/2012 >>>c/w sarcoid so no further w/u of pulm issues needed

## 2012-10-18 ENCOUNTER — Encounter: Payer: Self-pay | Admitting: Internal Medicine

## 2012-11-01 ENCOUNTER — Ambulatory Visit: Payer: PRIVATE HEALTH INSURANCE | Admitting: Internal Medicine

## 2012-12-14 ENCOUNTER — Other Ambulatory Visit: Payer: Self-pay

## 2013-02-07 ENCOUNTER — Emergency Department (HOSPITAL_COMMUNITY)
Admission: EM | Admit: 2013-02-07 | Discharge: 2013-02-07 | Disposition: A | Discharge status: Home / Self Care | Payer: PRIVATE HEALTH INSURANCE | Source: Home / Self Care | Attending: Family Medicine | Admitting: Family Medicine

## 2013-02-07 ENCOUNTER — Encounter (HOSPITAL_COMMUNITY): Payer: Self-pay | Admitting: Emergency Medicine

## 2013-02-07 DIAGNOSIS — J069 Acute upper respiratory infection, unspecified: Secondary | ICD-10-CM

## 2013-02-07 MED ORDER — IPRATROPIUM BROMIDE 0.06 % NA SOLN: 2.0000 | Freq: Four times a day (QID) | NASAL | Status: DC

## 2013-02-07 MED ORDER — HYDROCODONE-ACETAMINOPHEN 5-325 MG PO TABS: 0.5000 | ORAL_TABLET | Freq: Every evening | ORAL | Status: DC | PRN

## 2013-02-07 NOTE — ED Notes (Signed)
C/o cold sx States cough and lightheaded OTC medication was taking but no relief.
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
## 2013-02-08 DIAGNOSIS — D869 Sarcoidosis, unspecified: Secondary | ICD-10-CM

## 2013-02-08 HISTORY — DX: Sarcoidosis, unspecified: D86.9

## 2013-05-01 ENCOUNTER — Ambulatory Visit: Payer: PRIVATE HEALTH INSURANCE | Attending: Internal Medicine | Admitting: Internal Medicine

## 2013-05-01 VITALS — BP 154/111 | HR 99 | Temp 99.2°F | Resp 16 | Ht 62.0 in | Wt 251.0 lb

## 2013-05-01 DIAGNOSIS — D869 Sarcoidosis, unspecified: Secondary | ICD-10-CM

## 2013-05-01 DIAGNOSIS — E119 Type 2 diabetes mellitus without complications: Secondary | ICD-10-CM

## 2013-05-01 DIAGNOSIS — I10 Essential (primary) hypertension: Secondary | ICD-10-CM

## 2013-05-01 LAB — CBC WITH DIFFERENTIAL/PLATELET
BASOS ABS: 0 10*3/uL (ref 0.0–0.1)
Basophils Relative: 0 % (ref 0–1)
EOS PCT: 3 % (ref 0–5)
Eosinophils Absolute: 0.2 10*3/uL (ref 0.0–0.7)
HCT: 39.6 % (ref 36.0–46.0)
Hemoglobin: 13.4 g/dL (ref 12.0–15.0)
LYMPHS ABS: 1.8 10*3/uL (ref 0.7–4.0)
LYMPHS PCT: 34 % (ref 12–46)
MCH: 29.5 pg (ref 26.0–34.0)
MCHC: 33.8 g/dL (ref 30.0–36.0)
MCV: 87 fL (ref 78.0–100.0)
Monocytes Absolute: 0.5 10*3/uL (ref 0.1–1.0)
Monocytes Relative: 9 % (ref 3–12)
NEUTROS ABS: 2.9 10*3/uL (ref 1.7–7.7)
Neutrophils Relative %: 54 % (ref 43–77)
PLATELETS: 259 10*3/uL (ref 150–400)
RBC: 4.55 MIL/uL (ref 3.87–5.11)
RDW: 13.6 % (ref 11.5–15.5)
WBC: 5.4 10*3/uL (ref 4.0–10.5)

## 2013-05-01 LAB — LIPID PANEL
CHOL/HDL RATIO: 4 ratio
CHOLESTEROL: 123 mg/dL (ref 0–200)
HDL: 31 mg/dL — AB (ref >39)
TRIGLYCERIDES: 441 mg/dL — AB (ref <150)

## 2013-05-01 LAB — COMPLETE METABOLIC PANEL WITH GFR
ALT: 29 U/L (ref 0–35)
AST: 43 U/L — AB (ref 0–37)
Albumin: 4.1 g/dL (ref 3.5–5.2)
Alkaline Phosphatase: 120 U/L — ABNORMAL HIGH (ref 39–117)
BILIRUBIN TOTAL: 0.2 mg/dL (ref 0.2–1.2)
BUN: 12 mg/dL (ref 6–23)
CO2: 28 mEq/L (ref 19–32)
CREATININE: 0.88 mg/dL (ref 0.50–1.10)
Calcium: 9.7 mg/dL (ref 8.4–10.5)
Chloride: 101 mEq/L (ref 96–112)
GFR, Est Non African American: 88 mL/min
Glucose, Bld: 151 mg/dL — ABNORMAL HIGH (ref 70–99)
Potassium: 3.9 mEq/L (ref 3.5–5.3)
Sodium: 137 mEq/L (ref 135–145)
Total Protein: 7.8 g/dL (ref 6.0–8.3)

## 2013-05-01 LAB — POCT GLYCOSYLATED HEMOGLOBIN (HGB A1C): HEMOGLOBIN A1C: 10.1

## 2013-05-01 LAB — GLUCOSE, POCT (MANUAL RESULT ENTRY): POC Glucose: 151 mg/dl — AB (ref 70–99)

## 2013-05-01 MED ORDER — IPRATROPIUM BROMIDE 0.06 % NA SOLN: 2.0000 | Freq: Four times a day (QID) | NASAL | Status: DC

## 2013-05-01 MED ORDER — HYDROCHLOROTHIAZIDE 12.5 MG PO TABS: 12.5000 mg | ORAL_TABLET | Freq: Every day | ORAL | Status: DC

## 2013-05-01 MED ORDER — INSULIN NPH ISOPHANE & REGULAR (70-30) 100 UNIT/ML ~~LOC~~ SUSP: 65.0000 [IU] | Freq: Two times a day (BID) | SUBCUTANEOUS | Status: DC

## 2013-05-01 MED ORDER — BUDESONIDE-FORMOTEROL FUMARATE 80-4.5 MCG/ACT IN AERO: 2.0000 | INHALATION_SPRAY | Freq: Two times a day (BID) | RESPIRATORY_TRACT | Status: DC

## 2013-05-01 NOTE — Progress Notes (Signed)
Pt is here to establish care. Pt has a history of diabetes and HTN. Pt is requesting referrals.

## 2013-05-01 NOTE — Addendum Note (Signed)
Addended by: Allayne StackWINFREE, Jodeci Roarty R on: 05/01/2013 05:20 PM   Modules accepted: Orders

## 2013-05-01 NOTE — Progress Notes (Signed)
Patient ID: Tanya Hardy, female   DOB: 05-17-1980, 33 y.o.   MRN: [id number]   CC:  HPI: 33 year old female here to establish care. The patient has a history of sarcoidosis, insulin-dependent diabetes. For her sarcoidosis the patient is followed by Dr. Sandrea HughsMichael Wert. The patient was diagnosed with pulmonary/nasal sarcoidosis. She status post extensive sinus surgery by ENT. 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 Cardiology was consulted for further evaluation. This was thought to be a benign finding postoperatively and no further workup was recommended. An echocardiogram was recommended to rule out cardiac sarcoidosis and pulmonary hypertension. I do not see that a 2-D echo was ever done. She also poorly controlled diabetic, she states that his CBG ranges from 250-300. She denies any chest pain any shortness of breath She complains of multiple stresses in her life and has a history of manic depression. She denies any suicidal or homicidal ideation  Social history she quit smoking in January and restarted in March because of her stressors  Family history positive for diabetes in the mother      Allergies  Allergen Reactions  . Dexamethasone Sodium Phosphate Other (See Comments)    Pt states after receiving injection had severe nosebleeds, bruising, blood blister on lip   Past Medical History  Diagnosis Date  . Complication of anesthesia     anesthesia not strong enough, fought during surgery  . Diabetes mellitus without complication     diet controlled  . Depression 2011    treated at the time, no longer on meds  . Shortness of breath     going upstairs, being worked up for sarcoidoisi  . Chronic sinusitis   . Headache(784.0)     migraines (rare now)  . Obesity    Current Outpatient Prescriptions on File Prior to Visit  Medication Sig Dispense Refill  . HYDROcodone-acetaminophen (NORCO/VICODIN) 5-325 MG per tablet Take 0.5 tablets by mouth at  bedtime as needed (cough).  6 tablet  0   No current facility-administered medications on file prior to visit.   Family History  Problem Relation Age of Onset  . Sarcoidosis Brother   . Brain cancer Paternal Grandfather   . Colon cancer Paternal Aunt    History   Social History  . Marital Status: Married    Spouse Name: N/A    Number of Children: 0  . Years of Education: N/A   Occupational History  . Works at a [employer]    Social History Main Topics  . Smoking status: Former Smoker -- 0.25 packs/day for 11 years    Types: Cigarettes    Quit date: 04/07/2012  . Smokeless tobacco: Never Used  . Alcohol Use: Yes     Comment: occasional  . Drug Use: No  . Sexual Activity: Yes     Comment: trying to get pregnant   Other Topics Concern  . Not on file   Social History Narrative  . No narrative on file    Review of Systems  Constitutional: Negative for fever, chills, diaphoresis, activity change, appetite change and fatigue.  HENT: Negative for ear pain, nosebleeds, congestion, facial swelling, rhinorrhea, neck pain, neck stiffness and ear discharge.   Eyes: Negative for pain, discharge, redness, itching and visual disturbance.  Respiratory: Negative for cough, choking, chest tightness, shortness of breath, wheezing and stridor.   Cardiovascular: Negative for chest pain, palpitations and leg swelling.  Gastrointestinal: Negative for abdominal distention.  Genitourinary: Negative for dysuria, urgency,  frequency, hematuria, flank pain, decreased urine volume, difficulty urinating and dyspareunia.  Musculoskeletal: Negative for back pain, joint swelling, arthralgias and gait problem.  Neurological: Negative for dizziness, tremors, seizures, syncope, facial asymmetry, speech difficulty, weakness, light-headedness, numbness and headaches.  Hematological: Negative for adenopathy. Does not bruise/bleed easily.  Psychiatric/Behavioral: Negative for hallucinations, behavioral  problems, confusion, dysphoric mood, decreased concentration and agitation.    Objective:   Filed Vitals:   05/01/13 1609  BP: 154/111  Pulse: 99  Temp: 99.2 F (37.3 C)  Resp: 16    Physical Exam  Constitutional: Appears well-developed and well-nourished. No distress.  HENT: Normocephalic. External right and left ear normal. Oropharynx is clear and moist.  Eyes: Conjunctivae and EOM are normal. PERRLA, no scleral icterus.  Neck: Normal ROM. Neck supple. No JVD. No tracheal deviation. No thyromegaly.  CVS: RRR, S1/S2 +, no murmurs, no gallops, no carotid bruit.  Pulmonary: Effort and breath sounds normal, no stridor, rhonchi, wheezes, rales.  Abdominal: Soft. BS +,  no distension, tenderness, rebound or guarding.  Musculoskeletal: Normal range of motion. No edema and no tenderness.  Lymphadenopathy: No lymphadenopathy noted, cervical, inguinal. Neuro: Alert. Normal reflexes, muscle tone coordination. No cranial nerve deficit. Skin: Skin is warm and dry. No rash noted. Not diaphoretic. No erythema. No pallor.  Psychiatric: Normal mood and affect. Behavior, judgment, thought content normal.   Lab Results  Component Value Date   WBC 4.8 09/17/2012   HGB 12.1 09/17/2012   HCT 37.0 09/17/2012   MCV 84.3 09/17/2012   PLT 283 09/17/2012   Lab Results  Component Value Date   CREATININE 0.91 09/17/2012   BUN 16 09/17/2012   NA 137 09/17/2012   K 4.1 09/17/2012   CL 101 09/17/2012   CO2 24 09/17/2012    No results found for this basename: HGBA1C   Lipid Panel  No results found for this basename: chol, trig, hdl, cholhdl, vldl, ldlcalc       Assessment and plan:   Patient Active Problem List   Diagnosis Date Noted  . Sarcoidosis 10/06/2012  . Acute respiratory failure with hypoxia 09/15/2012  . Pulmonary infiltrates 09/15/2012  . Chronic rhinitis 04/20/2012  . Lymphadenopathy, mediastinal 03/27/2012   Sarcoidosis-pulmonary and nasal Followed by Dr. Sandrea Hughs Patient  states that she will schedule an appointment with them for followup   Nicotine dependence Patient counseled about smoking cessation She quit cold [place] in January   Diabetes Poorly controlled A1c pending Based on her CBC the patient has been recommended to increase 70/30-65 units twice a day. She will follow up with clinical pharmacy in one month She will follow up with me in 2 months    Hypertension-started on HCTZ today    Establish to care Baseline labs Gynecology referral for a Pap smear      The patient was given clear instructions to go to ER or return to medical center if symptoms don't improve, worsen or new problems develop. The patient verbalized understanding. The patient was told to call to get any lab results if not heard anything in the next week.

## 2013-05-02 LAB — VITAMIN D 25 HYDROXY (VIT D DEFICIENCY, FRACTURES): VIT D 25 HYDROXY: 28 ng/mL — AB (ref 30–89)

## 2013-05-02 LAB — TSH: TSH: 2.148 u[IU]/mL (ref 0.350–4.500)

## 2013-05-02 MED ORDER — INSULIN NPH ISOPHANE & REGULAR (70-30) 100 UNIT/ML ~~LOC~~ SUSP: 65.0000 [IU] | Freq: Two times a day (BID) | SUBCUTANEOUS | Status: DC

## 2013-05-02 MED ORDER — GEMFIBROZIL 600 MG PO TABS: 600.0000 mg | ORAL_TABLET | Freq: Two times a day (BID) | ORAL | Status: DC

## 2013-05-02 NOTE — Progress Notes (Signed)
LCSW met with patient in order to follow up on identification of low mood.  Patient identified that she has been experiencing several life stressors including those connected to her medical care.  LCSW initiated the rapport building process and requested that patient schedule a follow up session within the next 2 weeks. LCSW will assess further for depression and initiate mental health treatment.  Tanya Hardy MSW, LCSW Duration 30 minutes

## 2013-05-02 NOTE — Addendum Note (Signed)
Addended by: Susie CassetteABROL MD, Germain OsgoodNAYANA on: 05/02/2013 09:52 AM   Modules accepted: Orders

## 2013-05-07 ENCOUNTER — Telehealth: Payer: Self-pay | Admitting: Internal Medicine

## 2013-05-07 NOTE — Telephone Encounter (Signed)
Pt called regarding her test results, please contact pt  °

## 2013-05-08 ENCOUNTER — Telehealth: Payer: Self-pay | Admitting: Emergency Medicine

## 2013-05-08 NOTE — Telephone Encounter (Signed)
Message copied by Darlis LoanSMITH, Swain Acree D on Tue May 08, 2013  4:06 PM ------      Message from: Susie CassetteABROL MD, Germain OsgoodNAYANA      Created: Wed May 02, 2013  9:53 AM       Notify patient of the patient's diabetes is still poorly controlled, A1c 10.1      Triglycerides are increased at 441,Prescription for Lopid has been called to her preferred pharmacy      Vitamin D level is 28. The patient can take over-the-counter vitamin D 2000 international units twice a day ------

## 2013-05-08 NOTE — Telephone Encounter (Signed)
Left message for pt to call for lab results 

## 2013-05-09 ENCOUNTER — Telehealth: Payer: Self-pay | Admitting: Emergency Medicine

## 2013-05-09 ENCOUNTER — Ambulatory Visit: Payer: PRIVATE HEALTH INSURANCE | Attending: Internal Medicine | Admitting: *Deleted

## 2013-05-09 DIAGNOSIS — F3289 Other specified depressive episodes: Secondary | ICD-10-CM

## 2013-05-09 DIAGNOSIS — F329 Major depressive disorder, single episode, unspecified: Secondary | ICD-10-CM

## 2013-05-09 NOTE — Telephone Encounter (Signed)
Please call pt. With results.

## 2013-05-09 NOTE — Telephone Encounter (Signed)
Left message with extension for pt to call tomorrow
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
## 2013-05-10 ENCOUNTER — Other Ambulatory Visit (HOSPITAL_COMMUNITY)
Admission: RE | Admit: 2013-05-10 | Discharge: 2013-05-10 | Disposition: A | Discharge status: Home / Self Care | Payer: PRIVATE HEALTH INSURANCE | Source: Ambulatory Visit | Attending: Family Medicine | Admitting: Family Medicine

## 2013-05-10 ENCOUNTER — Ambulatory Visit (INDEPENDENT_AMBULATORY_CARE_PROVIDER_SITE_OTHER): Payer: PRIVATE HEALTH INSURANCE | Admitting: Family Medicine

## 2013-05-10 VITALS — BP 131/84 | HR 94 | Temp 99.6°F | Wt 248.0 lb

## 2013-05-10 DIAGNOSIS — Z01419 Encounter for gynecological examination (general) (routine) without abnormal findings: Secondary | ICD-10-CM

## 2013-05-10 DIAGNOSIS — Z1151 Encounter for screening for human papillomavirus (HPV): Secondary | ICD-10-CM | POA: Insufficient documentation

## 2013-05-10 DIAGNOSIS — Z124 Encounter for screening for malignant neoplasm of cervix: Secondary | ICD-10-CM

## 2013-05-10 NOTE — Patient Instructions (Signed)
We will call you with the results from today's exam.   Please follow up with your primary doctor about your irregular bleeding and I will make suggestions to them about lab tests they may consider.   Papanicolaou Test This is a test which is used to screen for cancer of the cervix. In this test, cells from the cervix are removed and are examined under a microscope by a pathologist (a specialist in looking at body tissues). The cells are removed from the cervix by a simple scraping or brushing of the cervix and the insides of the cervix. This is a very accurate test in detecting cervical cancer and has been very helpful in decreasing deaths from this disease. Pap tests are reported in terms of cervical intraepithelial neoplasia (CIN). Cervix means in the cervix; intraepithelial means changes in the cells; and neoplasia means new growth. The sub classes of CIN are:   CIN 1: mild and mild-to-moderate dysplasia (abnormal tissue)  CIN 2: moderate and moderate-to-severe dysplasia  CIN 3: severe dysplasia and carcinoma (malignant tumor) in process. The Pap test, when performed routinely, has been a great help in the early detection of cervical cancer, which is treatable if caught at an early stage. The Pap test is also used to monitor any abnormalities or unusual findings. In many cases, these findings are part of a body's repair process and often resolve themselves without any further treatment. If you douche, tub-bathe, or use vaginal creams 48 - 72 hours prior to the examination, your test results might be "unsatisfactory." PREPARATION FOR TEST The test should be done when you are not having a period or vaginal bleeding. NORMAL FINDINGS No abnormal or atypical cells. Ranges for normal findings may vary among different laboratories and hospitals. You should always check with your doctor after having lab work or other tests done to discuss the meaning of your test results and whether your values are  considered within normal limits. MEANING OF TEST  Your caregiver will go over the test results with you and discuss the importance and meaning of your results, as well as treatment options and the need for additional tests if necessary. OBTAINING THE TEST RESULTS  It is your responsibility to obtain your test results. Ask the lab or department performing the test when and how you will get your results. Document Released: 02/28/2004 Document Revised: 04/19/2011 Document Reviewed: 01/08/2008 [facility] Patient Information 2014 [employer] [place].

## 2013-05-10 NOTE — Progress Notes (Signed)
Patient ID: Tanya DillsLatasha File, female   DOB: September 13, 1980, 33 y.o.   MRN: [id number] Subjective:     Tanya Hardy is a 33 y.o. female here for a routine exam.  Current complaints: irregular cycle last month. Traces of blood and mucus and didn't have to really use a pad.  Currently 28 day cycle.  No family members with similar symptoms.  Previously on lose dose OCP six years ago.  The irregularity in the amount has started 2 months ago. History of STD in 2007. Has been evalatued in [place] 2012 for fertility and diabetes. She has never been pregnant. She denies fever, chills, discharge, abdominal pain, nausea or vomiting. She is a monogamous relationship with her husband.    Gynecologic History Patient's last menstrual period was 04/29/2013. Contraception: none Last Pap: 2011. Results were: normal Last mammogram: none.   Obstetric History OB History  No data available   Review of Systems Pertinent items are noted in HPI.    Objective:    Pelvic: cervix normal in appearance, external genitalia normal, no adnexal masses or tenderness, no cervical motion tenderness, rectovaginal septum normal, uterus normal size, shape, and consistency and white discharge noted with no odor.     Assessment:    Healthy female exam.    Plan:    Pap smear: will call with results.  Consider PCOS vs DUB. Patient with uncontrolled diabetes. May need further work up with FSH, LH, DHEAS, or a pelvic ultrasound if necessary.

## 2013-05-10 NOTE — Addendum Note (Signed)
Addended by: Farrell OursEVANS, Jagdeep Ancheta K on: 05/10/2013 05:25 PM   Modules accepted: Orders

## 2013-05-11 ENCOUNTER — Telehealth: Payer: Self-pay

## 2013-05-11 NOTE — Telephone Encounter (Signed)
Returned patient call She is aware of her lab results

## 2013-05-16 ENCOUNTER — Ambulatory Visit: Payer: PRIVATE HEALTH INSURANCE | Attending: Internal Medicine | Admitting: *Deleted

## 2013-05-16 DIAGNOSIS — F4323 Adjustment disorder with mixed anxiety and depressed mood: Secondary | ICD-10-CM

## 2013-05-17 NOTE — Progress Notes (Signed)
LCSW met with patient who began with processing letter to deceased father.  In the processing patient identified challenges with her family of origin and dysfunctional family relational patterns.  Patient stated that she can now recognize them  LCSW processed the impact of some of these patterns on her family members.  Patient also stated that she has chosen to be more open to hearing from others about their feelings and perspectives.  LCSW encouraged patient to continue processing these challenges and patterns in order to address these emotional and social and relational pitfalls within her family background to prevent reoccurring behaviors.   Christene Lye MSW, LCSW Duration 60 minutes

## 2013-05-23 ENCOUNTER — Ambulatory Visit: Payer: PRIVATE HEALTH INSURANCE | Attending: Internal Medicine | Admitting: *Deleted

## 2013-05-23 DIAGNOSIS — F4323 Adjustment disorder with mixed anxiety and depressed mood: Secondary | ICD-10-CM

## 2013-05-23 NOTE — Progress Notes (Signed)
LCSW met with patient who has identified that she feels that her mood has been improving. Patient desired to discuss challenges in her relationships beginning with marriage. LCSW processed these challenges and encouraged patient to seek alternative methods of communicating emotions and challenges with her husband.  LCSW also encouraged patient to use the strengths of those that she has relationship with in order to deal with the challenges that she has in those relationships.  LCSW encouraged patient to continue processing these challenges with people in her family without desiring to change them, but allow for her to have balanced relationship with them.   Christene Lye MSW, LCSW Duration 60 minutes

## 2013-05-30 ENCOUNTER — Ambulatory Visit: Payer: PRIVATE HEALTH INSURANCE | Attending: Internal Medicine | Admitting: *Deleted

## 2013-05-30 ENCOUNTER — Encounter: Payer: Self-pay | Admitting: Obstetrics and Gynecology

## 2013-05-30 DIAGNOSIS — F4323 Adjustment disorder with mixed anxiety and depressed mood: Secondary | ICD-10-CM

## 2013-05-31 NOTE — Progress Notes (Signed)
LCSW met with the patient in order to work on goal formation.  Patient was open to this but identified that she has clear goals that she is working on and frequently has a difficult time pursuing other goals while she is working on the ones before her.  LCSW encouraged patient to incorporate at least 1 additional self care goal in order to reduce level of stress while pursuing goals.  Patient also disclosed more history about self and identified current needs in the area of managing behavior.  LCSW provided direction and plan to initiate behavior management techniques.    LCSW will continue to follow.  Christene Lye MSW, LCSW Duration 120 minutes

## 2013-06-06 ENCOUNTER — Ambulatory Visit: Payer: PRIVATE HEALTH INSURANCE | Attending: Internal Medicine | Admitting: *Deleted

## 2013-06-06 DIAGNOSIS — F4323 Adjustment disorder with mixed anxiety and depressed mood: Secondary | ICD-10-CM

## 2013-06-06 NOTE — Progress Notes (Signed)
LCSW met with patient who states that she feels that she is making progress.  Patient stated that her greatest stress is her job.  LCSW worked with patient to identify and work towards greater balance between home and work life. Patient continues to need to work on improving self care.  LCSW also processed and taught patient self care skills to support stress and anxiety management.    Christene Lye MSW, LCSW Duration 60 minutes

## 2013-06-13 ENCOUNTER — Ambulatory Visit: Payer: PRIVATE HEALTH INSURANCE | Attending: Internal Medicine | Admitting: *Deleted

## 2013-06-13 DIAGNOSIS — F4323 Adjustment disorder with mixed anxiety and depressed mood: Secondary | ICD-10-CM
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
## 2013-06-20 ENCOUNTER — Ambulatory Visit: Payer: PRIVATE HEALTH INSURANCE | Attending: Internal Medicine | Admitting: *Deleted

## 2013-06-20 DIAGNOSIS — F4323 Adjustment disorder with mixed anxiety and depressed mood: Secondary | ICD-10-CM

## 2013-06-20 NOTE — Progress Notes (Signed)
LCSW met with patient in order to process ongoing challenges with managing emotions.  Patient stated that she has been doing well but continues to have challenges with managing her emotions.  Patient has decided to write a letter to another family member about past hurts and challenges.  LCSW processed with patient the change and flow of mood as she read the letter.  Patient was able to identify with the progress of her treatment that was evident in the flow of the letter.  The patient stated that she had developed a broader view about her past that was helping with her mood.  Christene Lye MSW, LCSW Duration 75 minutes

## 2013-06-26 ENCOUNTER — Ambulatory Visit: Payer: PRIVATE HEALTH INSURANCE | Attending: Internal Medicine | Admitting: *Deleted

## 2013-06-26 DIAGNOSIS — F4323 Adjustment disorder with mixed anxiety and depressed mood: Secondary | ICD-10-CM

## 2013-06-26 NOTE — Progress Notes (Signed)
LCSW met with patient who stated that at this time her concern was her conflicts with her husband. LCSW processed these challenges and the fact this they tend to be cyclical.  LCSW processed with patient developing a plan to address these issues.  Patient and LCSW identified that counseling is the next appropriate step for treatment.  LCSW encouraged this process to improve work on self as well.  LCSW will continue for weekly sessions with transition of nieces coming to live with her and husband.  Christene Lye MSW, LCSW Duration 75 minutes

## 2013-07-03 ENCOUNTER — Ambulatory Visit: Payer: PRIVATE HEALTH INSURANCE | Attending: Internal Medicine | Admitting: Internal Medicine

## 2013-07-03 ENCOUNTER — Encounter: Payer: Self-pay | Admitting: Internal Medicine

## 2013-07-03 VITALS — BP 150/90 | HR 78 | Temp 98.6°F | Resp 16 | Wt 250.0 lb

## 2013-07-03 DIAGNOSIS — E1165 Type 2 diabetes mellitus with hyperglycemia: Principal | ICD-10-CM

## 2013-07-03 DIAGNOSIS — I10 Essential (primary) hypertension: Secondary | ICD-10-CM

## 2013-07-03 DIAGNOSIS — Z794 Long term (current) use of insulin: Secondary | ICD-10-CM | POA: Insufficient documentation

## 2013-07-03 DIAGNOSIS — IMO0001 Reserved for inherently not codable concepts without codable children: Secondary | ICD-10-CM | POA: Insufficient documentation

## 2013-07-03 DIAGNOSIS — E781 Pure hyperglyceridemia: Secondary | ICD-10-CM

## 2013-07-03 DIAGNOSIS — H538 Other visual disturbances: Secondary | ICD-10-CM | POA: Insufficient documentation

## 2013-07-03 DIAGNOSIS — H04129 Dry eye syndrome of unspecified lacrimal gland: Secondary | ICD-10-CM

## 2013-07-03 DIAGNOSIS — Z79899 Other long term (current) drug therapy: Secondary | ICD-10-CM | POA: Insufficient documentation

## 2013-07-03 DIAGNOSIS — Z87891 Personal history of nicotine dependence: Secondary | ICD-10-CM | POA: Insufficient documentation

## 2013-07-03 DIAGNOSIS — E119 Type 2 diabetes mellitus without complications: Secondary | ICD-10-CM

## 2013-07-03 DIAGNOSIS — H04123 Dry eye syndrome of bilateral lacrimal glands: Secondary | ICD-10-CM

## 2013-07-03 LAB — GLUCOSE, POCT (MANUAL RESULT ENTRY): POC Glucose: 230 mg/dl — AB (ref 70–99)

## 2013-07-03 MED ORDER — METFORMIN HCL 500 MG PO TABS: 500.0000 mg | ORAL_TABLET | Freq: Two times a day (BID) | ORAL | Status: DC

## 2013-07-03 MED ORDER — NAPHAZOLINE HCL 0.1 % OP SOLN: 1.0000 [drp] | Freq: Four times a day (QID) | OPHTHALMIC | Status: DC | PRN

## 2013-07-03 NOTE — Patient Instructions (Signed)
Diabetes Meal Planning Guide The diabetes meal planning guide is a tool to help you plan your meals and snacks. It is important for people with diabetes to manage their blood glucose (sugar) levels. Choosing the right foods and the right amounts throughout your day will help control your blood glucose. Eating right can even help you improve your blood pressure and reach or maintain a healthy weight. CARBOHYDRATE COUNTING MADE EASY When you eat carbohydrates, they turn to sugar. This raises your blood glucose level. Counting carbohydrates can help you control this level so you feel better. When you plan your meals by counting carbohydrates, you can have more flexibility in what you eat and balance your medicine with your food intake. Carbohydrate counting simply means adding up the total amount of carbohydrate grams in your meals and snacks. Try to eat about the same amount at each meal. Foods with carbohydrates are listed below. Each portion below is 1 carbohydrate serving or 15 grams of carbohydrates. Ask your dietician how many grams of carbohydrates you should eat at each meal or snack. Grains and Starches  1 slice bread.   English muffin or hotdog/hamburger bun.   cup cold cereal (unsweetened).   cup cooked pasta or rice.   cup starchy vegetables (corn, potatoes, peas, beans, winter squash).  1 tortilla (6 inches).   bagel.  1 waffle or pancake (size of a CD).   cup cooked cereal.  4 to 6 small crackers. *Whole grain is recommended. Fruit  1 cup fresh unsweetened berries, melon, papaya, pineapple.  1 small fresh fruit.   banana or mango.   cup fruit juice (4 oz unsweetened).   cup canned fruit in natural juice or water.  2 tbs dried fruit.  12 to 15 grapes or cherries. Milk and Yogurt  1 cup fat-free or 1% milk.  1 cup soy milk.  6 oz light yogurt with sugar-free sweetener.  6 oz low-fat soy yogurt.  6 oz plain yogurt. Vegetables  1 cup raw or  cup  cooked is counted as 0 carbohydrates or a "free" food.  If you eat 3 or more servings at 1 meal, count them as 1 carbohydrate serving. Other Carbohydrates   oz chips or pretzels.   cup ice cream or frozen yogurt.   cup sherbet or sorbet.  2 inch square cake, no frosting.  1 tbs honey, sugar, jam, jelly, or syrup.  2 small cookies.  3 squares of graham crackers.  3 cups popcorn.  6 crackers.  1 cup broth-based soup.  Count 1 cup casserole or other mixed foods as 2 carbohydrate servings.  Foods with less than 20 calories in a serving may be counted as 0 carbohydrates or a "free" food. You may want to purchase a book or computer software that lists the carbohydrate gram counts of different foods. In addition, the nutrition facts panel on the labels of the foods you eat are a good source of this information. The label will tell you how big the serving size is and the total number of carbohydrate grams you will be eating per serving. Divide this number by 15 to obtain the number of carbohydrate servings in a portion. Remember, 1 carbohydrate serving equals 15 grams of carbohydrate. SERVING SIZES Measuring foods and serving sizes helps you make sure you are getting the right amount of food. The list below tells how big or small some common serving sizes are.  1 oz.........4 stacked dice.  3 oz.........Deck of cards.  1 tsp........Tip   of little finger.  1 tbs........Thumb.  2 tbs........Golf ball.   cup.......Half of a fist.  1 cup........A fist. SAMPLE DIABETES MEAL PLAN Below is a sample meal plan that includes foods from the grain and starches, dairy, vegetable, fruit, and meat groups. A dietician can individualize a meal plan to fit your calorie needs and tell you the number of servings needed from each food group. However, controlling the total amount of carbohydrates in your meal or snack is more important than making sure you include all of the food groups at every  meal. You may interchange carbohydrate containing foods (dairy, starches, and fruits). The meal plan below is an example of a 2000 calorie diet using carbohydrate counting. This meal plan has 17 carbohydrate servings. Breakfast  1 cup oatmeal (2 carb servings).   cup light yogurt (1 carb serving).  1 cup blueberries (1 carb serving).   cup almonds. Snack  1 large apple (2 carb servings).  1 low-fat string cheese stick. Lunch  Chicken breast salad.  1 cup spinach.   cup chopped tomatoes.  2 oz chicken breast, sliced.  2 tbs low-fat Italian dressing.  12 whole-wheat crackers (2 carb servings).  12 to 15 grapes (1 carb serving).  1 cup low-fat milk (1 carb serving). Snack  1 cup carrots.   cup hummus (1 carb serving). Dinner  3 oz broiled salmon.  1 cup brown rice (3 carb servings). Snack  1  cups steamed broccoli (1 carb serving) drizzled with 1 tsp olive oil and lemon juice.  1 cup light pudding (2 carb servings). DIABETES MEAL PLANNING WORKSHEET Your dietician can use this worksheet to help you decide how many servings of foods and what types of foods are right for you.  BREAKFAST Food Group and Servings / Carb Servings Grain/Starches __________________________________ Dairy __________________________________________ Vegetable ______________________________________ Fruit ___________________________________________ Meat __________________________________________ Fat ____________________________________________ LUNCH Food Group and Servings / Carb Servings Grain/Starches ___________________________________ Dairy ___________________________________________ Fruit ____________________________________________ Meat ___________________________________________ Fat _____________________________________________ DINNER Food Group and Servings / Carb Servings Grain/Starches ___________________________________ Dairy  ___________________________________________ Fruit ____________________________________________ Meat ___________________________________________ Fat _____________________________________________ SNACKS Food Group and Servings / Carb Servings Grain/Starches ___________________________________ Dairy ___________________________________________ Vegetable _______________________________________ Fruit ____________________________________________ Meat ___________________________________________ Fat _____________________________________________ DAILY TOTALS Starches _________________________ Vegetable ________________________ Fruit ____________________________ Dairy ____________________________ Meat ____________________________ Fat ______________________________ Document Released: 10/22/2004 Document Revised: 04/19/2011 Document Reviewed: 09/02/2008 Exit[employer] Patient Information 2014 [employer] DASH Diet The DASH diet stands for "Dietary Approaches to Stop Hypertension." It is a healthy eating plan that has been shown to reduce high blood pressure (hypertension) in as little as 14 days, while also possibly providing other significant health benefits. These other health benefits include reducing the risk of breast cancer after menopause and reducing the risk of type 2 diabetes, heart disease, colon cancer, and stroke. Health benefits also include weight loss and slowing kidney failure in patients with chronic kidney disease.  DIET GUIDELINES  Limit salt (sodium). Your diet should contain less than 1500 mg of sodium daily.  Limit refined or processed carbohydrates. Your diet should include mostly whole grains. Desserts and added sugars should be used sparingly.  Include small amounts of heart-healthy fats. These types of fats include nuts, oils, and tub margarine. Limit saturated and trans fats. These fats have been shown to be harmful in the body. CHOOSING FOODS  The following food groups  are based on a 2000 calorie diet. See your Registered Dietitian for individual calorie needs. Grains and Grain Products (6 to 8 servings daily)  Eat More Often:   Whole-wheat bread, brown rice, whole-grain or wheat pasta, quinoa, popcorn without added fat or salt (air popped).  Eat Less Often: White bread, white pasta, white rice, cornbread. Vegetables (4 to 5 servings daily)  Eat More Often: Fresh, frozen, and canned vegetables. Vegetables may be raw, steamed, roasted, or grilled with a minimal amount of fat.  Eat Less Often/Avoid: Creamed or fried vegetables. Vegetables in a cheese sauce. Fruit (4 to 5 servings daily)  Eat More Often: All fresh, canned (in natural juice), or frozen fruits. Dried fruits without added sugar. One hundred percent fruit juice ( cup [237 mL] daily).  Eat Less Often: Dried fruits with added sugar. Canned fruit in light or heavy syrup. Lean Meats, Fish, and Poultry (2 servings or less daily. One serving is 3 to 4 oz [85-114 g]).  Eat More Often: Ninety percent or leaner ground beef, tenderloin, sirloin. Round cuts of beef, chicken breast, turkey breast. All fish. Grill, bake, or broil your meat. Nothing should be fried.  Eat Less Often/Avoid: Fatty cuts of meat, turkey, or chicken leg, thigh, or wing. Fried cuts of meat or fish. Dairy (2 to 3 servings)  Eat More Often: Low-fat or fat-free milk, low-fat plain or light yogurt, reduced-fat or part-skim cheese.  Eat Less Often/Avoid: Milk (whole, 2%).Whole milk yogurt. Full-fat cheeses. Nuts, Seeds, and Legumes (4 to 5 servings per week)  Eat More Often: All without added salt.  Eat Less Often/Avoid: Salted nuts and seeds, canned beans with added salt. Fats and Sweets (limited)  Eat More Often: Vegetable oils, tub margarines without trans fats, sugar-free gelatin. Mayonnaise and salad dressings.  Eat Less Often/Avoid: Coconut oils, palm oils, butter, stick margarine, cream, half and half, cookies, candy,  pie. FOR MORE INFORMATION The Dash Diet Eating Plan: www.dashdiet.org Document Released: 01/14/2011 Document Revised: 04/19/2011 Document Reviewed: 01/14/2011 [employer] Patient Information 2014 [employer].  

## 2013-07-03 NOTE — Progress Notes (Signed)
MRN: [id number] Name: Tanya Hardy  Sex: female Age: 33 y.o. DOB: 24-May-1980  Allergies: Dexamethasone sodium phosphate  Chief Complaint  Patient presents with  . Follow-up    HPI: Patient is 33 y.o. female who has to of diabetes, hypertriglyceridemia comes today for followup, she is taking insulin 70/30 65 units twice a day as per patient is to have fasting sugar is around 200 mg/dL, as per patient she used metformin in the past which helped her with her diabetes control and would like to go back on this medication, she also reported to have dry eyes and blurry vision denies any discharge. Patient also history of sarcoidosis and following up with pulmonologist.   Past Medical History  Diagnosis Date  . Complication of anesthesia     anesthesia not strong enough, fought during surgery  . Diabetes mellitus without complication     diet controlled  . Depression 2011    treated at the time, no longer on meds  . Shortness of breath     going upstairs, being worked up for sarcoidoisi  . Chronic sinusitis   . Headache(784.0)     migraines (rare now)  . Obesity     Past Surgical History  Procedure Laterality Date  . Video bronchoscopy Bilateral 05/15/2012    Procedure: VIDEO BRONCHOSCOPY WITH FLUORO;  Surgeon: Nyoka Cowden, MD;  Location: [facility];  Service: Cardiopulmonary;  Laterality: Bilateral;  . Nasal sinus surgery N/A 09/15/2012    Procedure: BILATERAL ENDOSCOPIC MAXILLARY ANTROSTOMY/ETHMOIDECTOMY/FRONTAL SINUS SURGERY and sphenoidotomy;  Surgeon: Serena Colonel, MD;  Location: [facility] OR;  Service: ENT;  Laterality: N/A;      Medication List       This list is accurate as of: 07/03/13  5:06 PM.  Always use your most recent med list.               gemfibrozil 600 MG tablet  Commonly known as:  LOPID  Take 1 tablet (600 mg total) by mouth 2 (two) times daily before a meal.     hydrochlorothiazide 12.5 MG tablet  Commonly known as:  HYDRODIURIL  Take 1 tablet  (12.5 mg total) by mouth daily.     insulin NPH-regular Human (70-30) 100 UNIT/ML injection  Commonly known as:  HUMULIN 70/30  Inject 65 Units into the skin 2 (two) times daily with a meal.     metFORMIN 500 MG tablet  Commonly known as:  GLUCOPHAGE  Take 1 tablet (500 mg total) by mouth 2 (two) times daily with a meal.        Meds ordered this encounter  Medications  . metFORMIN (GLUCOPHAGE) 500 MG tablet    Sig: Take 1 tablet (500 mg total) by mouth 2 (two) times daily with a meal.    Dispense:  180 tablet    Refill:  3    There is no immunization history for the selected administration types on file for this patient.  Family History  Problem Relation Age of Onset  . Sarcoidosis Brother   . Brain cancer Paternal Grandfather   . Colon cancer Paternal Aunt     History  Substance Use Topics  . Smoking status: Former Smoker -- 0.25 packs/day for 11 years    Types: Cigarettes    Quit date: 04/07/2012  . Smokeless tobacco: Never Used  . Alcohol Use: Yes     Comment: occasional    Review of Systems   As noted in HPI  Filed Vitals:  07/03/13 1704  BP: 150/90  Pulse:   Temp:   Resp:     Physical Exam  Physical Exam  Constitutional: No distress.  Eyes: EOM are normal. Pupils are equal, round, and reactive to light.  Cardiovascular: Normal rate and regular rhythm.   Pulmonary/Chest: Breath sounds normal. No respiratory distress. She has no wheezes. She has no rales.  Musculoskeletal: She exhibits no edema.    CBC    Component Value Date/Time   WBC 5.4 05/01/2013 1644   RBC 4.55 05/01/2013 1644   HGB 13.4 05/01/2013 1644   HCT 39.6 05/01/2013 1644   PLT 259 05/01/2013 1644   MCV 87.0 05/01/2013 1644   LYMPHSABS 1.8 05/01/2013 1644   MONOABS 0.5 05/01/2013 1644   EOSABS 0.2 05/01/2013 1644   BASOSABS 0.0 05/01/2013 1644    CMP     Component Value Date/Time   NA 137 05/01/2013 1644   K 3.9 05/01/2013 1644   CL 101 05/01/2013 1644   CO2 28 05/01/2013 1644    GLUCOSE 151* 05/01/2013 1644   BUN 12 05/01/2013 1644   CREATININE 0.88 05/01/2013 1644   CREATININE 0.91 09/17/2012 0419   CALCIUM 9.7 05/01/2013 1644   PROT 7.8 05/01/2013 1644   ALBUMIN 4.1 05/01/2013 1644   AST 43* 05/01/2013 1644   ALT 29 05/01/2013 1644   ALKPHOS 120* 05/01/2013 1644   BILITOT 0.2 05/01/2013 1644   GFRNONAA 88 05/01/2013 1644   GFRNONAA 84* 09/17/2012 0419   GFRAA >89 05/01/2013 1644   GFRAA >90 09/17/2012 0419    Lab Results  Component Value Date/Time   CHOL 123 05/01/2013  4:44 PM    No components found with this basename: hga1c    Lab Results  Component Value Date/Time   AST 43* 05/01/2013  4:44 PM    Assessment and Plan  DM (diabetes mellitus) - Plan:  Results for orders placed in visit on 07/03/13  GLUCOSE, POCT (MANUAL RESULT ENTRY)      Result Value Ref Range   POC Glucose 230 (*) 70 - 99 mg/dl   Diabetes uncontrolled , I have started patient on metFORMIN (GLUCOPHAGE) 500 MG tablet twice a day continue with current dose of insulin, advised patient to keep the fingerstick log, will repeat A1c in the next visit. Her  Essential hypertension, benign Manual blood pressure is 150/90 as per patient for the last 2 days she has not taken her blood pressure medication, advised patient to take her medications daily.   Hypertriglyceridemia Patient is on Lopid, will repeat lipid panel on the next visit.  Blurry vision/dry eyes - Plan: Prescribed naphazoline eyedrops  Ambulatory referral to Ophthalmology  Return in about 3 months (around 10/03/2013) for diabetes, hypertension.  Doris Cheadleeepak Verneta Hamidi, MD

## 2013-07-03 NOTE — Progress Notes (Signed)
Patient here for follow up on her DM 

## 2013-07-04 DIAGNOSIS — E1159 Type 2 diabetes mellitus with other circulatory complications: Secondary | ICD-10-CM | POA: Insufficient documentation

## 2013-07-04 DIAGNOSIS — I152 Hypertension secondary to endocrine disorders: Secondary | ICD-10-CM | POA: Insufficient documentation

## 2013-07-04 DIAGNOSIS — I10 Essential (primary) hypertension: Secondary | ICD-10-CM

## 2013-07-04 DIAGNOSIS — IMO0002 Reserved for concepts with insufficient information to code with codable children: Secondary | ICD-10-CM | POA: Insufficient documentation

## 2013-07-04 DIAGNOSIS — E1165 Type 2 diabetes mellitus with hyperglycemia: Secondary | ICD-10-CM | POA: Insufficient documentation

## 2013-07-05 ENCOUNTER — Telehealth: Payer: Self-pay | Admitting: Internal Medicine

## 2013-07-05 ENCOUNTER — Ambulatory Visit: Payer: PRIVATE HEALTH INSURANCE | Attending: Internal Medicine | Admitting: *Deleted

## 2013-07-05 DIAGNOSIS — F4323 Adjustment disorder with mixed anxiety and depressed mood: Secondary | ICD-10-CM

## 2013-07-05 NOTE — Telephone Encounter (Signed)
Pt needs to be scheduled for oral surgery and dental office would like to clarify as pt is diabetic whether pt can tolerate Decadron 4mg  TID post-surgery for swelling. Please f/u with dental office.

## 2013-07-05 NOTE — Progress Notes (Signed)
LCSW met with patient in order to assess challenges with mood emotions and relationships. Patient has made great progress but continues to have challenges with relationships especially with her husband.  LCSW processed with patient the misperceptions that continue to arise in their communication.  LCSW encouraged patient to move forward with couples counseling in order to work on challenges with communication.  LCSW encouraged patient to continue moving her progress forward.  LCSW assigned patient to design a change plan for desire to advocate and design next steps for her life.  LCSW also shared with patient that she is welcome to meet biweekly if she chooses.    Christene Lye MSW, LCSW Duration 60 minutes

## 2013-07-11 ENCOUNTER — Encounter: Payer: PRIVATE HEALTH INSURANCE | Admitting: Obstetrics and Gynecology

## 2013-07-13 IMAGING — CT CT MAXILLOFACIAL W/O CM
3 series · 16 of 47 positions shown, 19 images · non-contrast
Comparison: None.

CLINICAL DATA: Epistaxis with orbital and facial swelling following
bronchoscopy for sarcoidosis

CT MAXILLOFACIAL WITHOUT CONTRAST
TECHNIQUE: Multidetector CT imaging of the maxillofacial
structures was performed. Multiplanar CT image reconstructions were
also generated.

[Series 3: orbit/facial 2.0 h30s · axial · 0.30mm/px · z∈[-240,-102]mm · 10 of 81 slices shown, 13 images]
[im 6/81  brain]
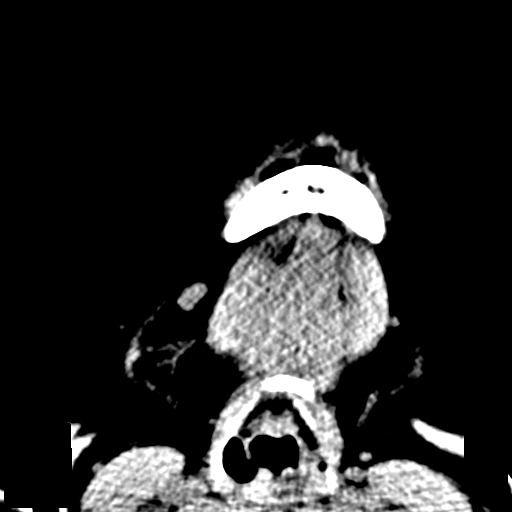
[im 6/81  bone]
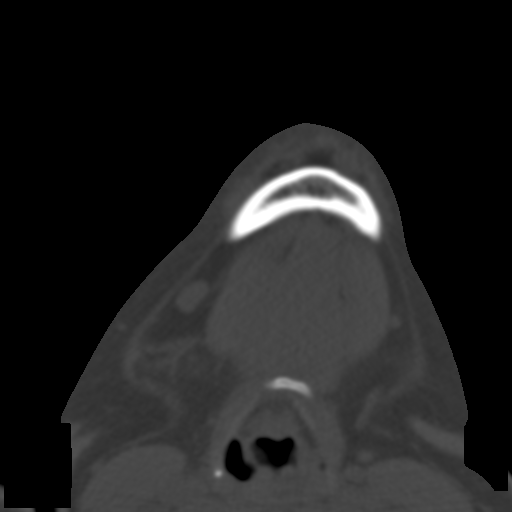
[im 14/81  bone]
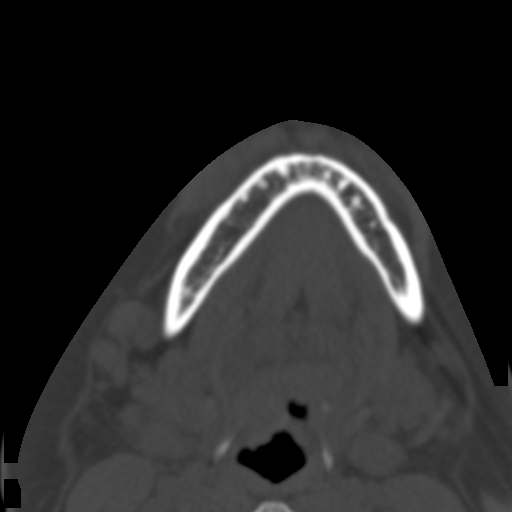
[im 23/81  bone]
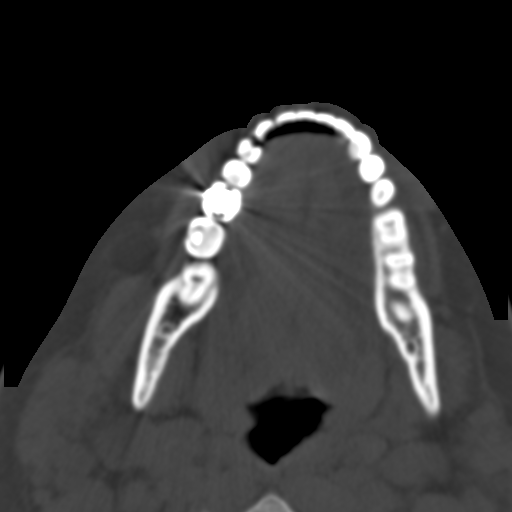
[im 28/81  bone]
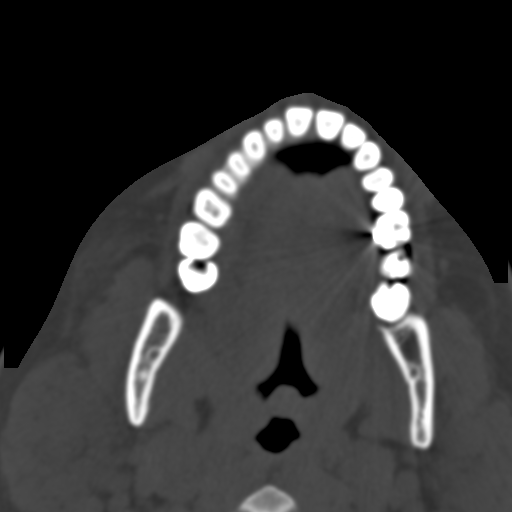
[im 36/81  brain]
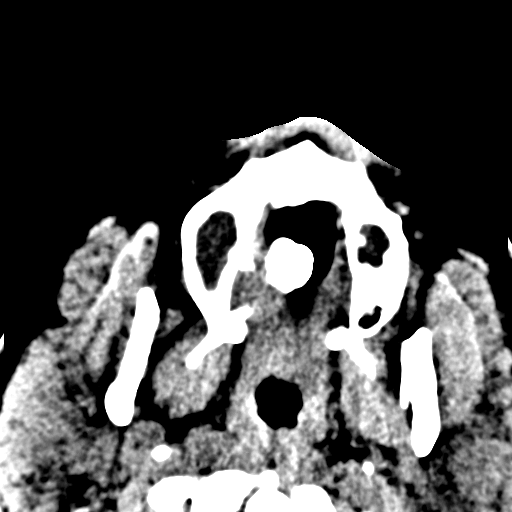
[im 36/81  bone]
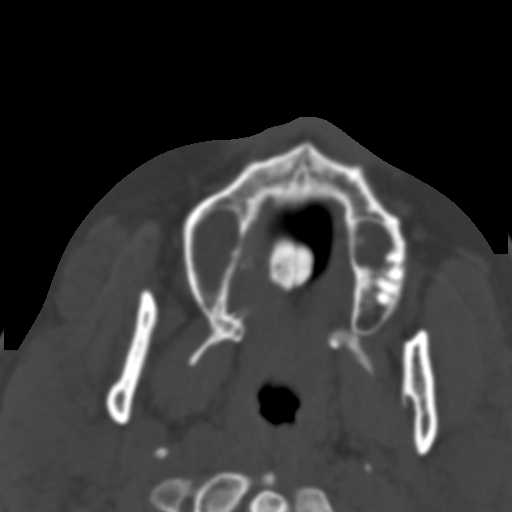
[im 45/81  bone]
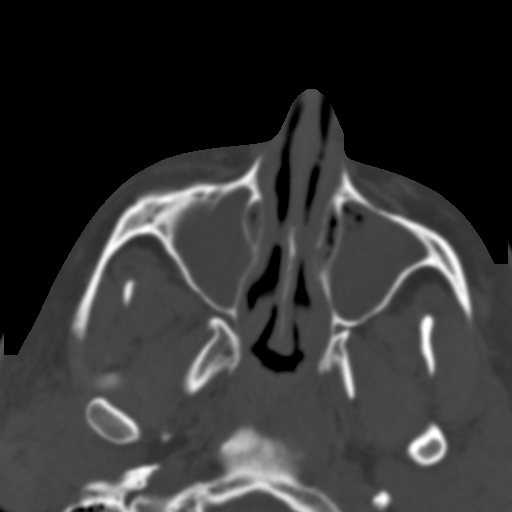
[im 53/81  bone]
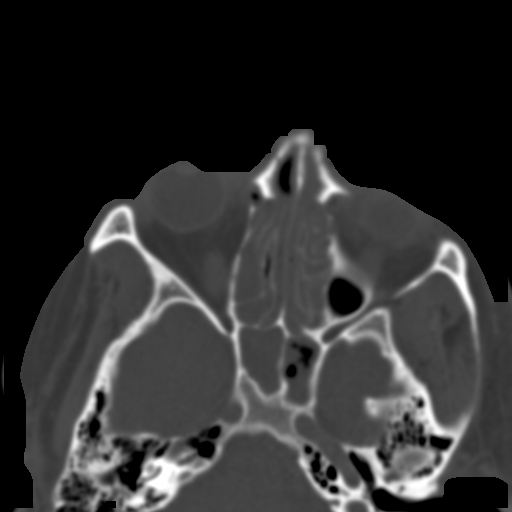
[im 61/81  bone]
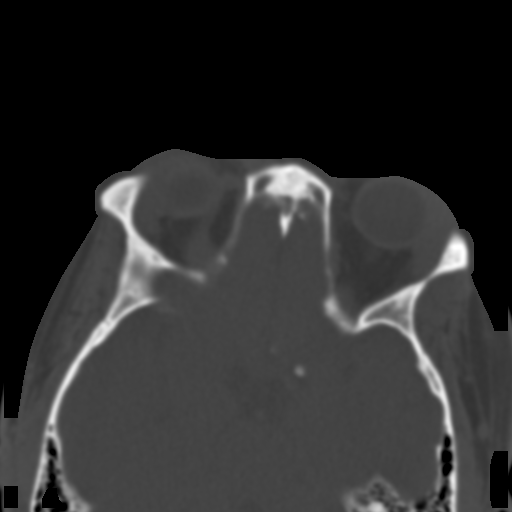
[im 67/81  brain]
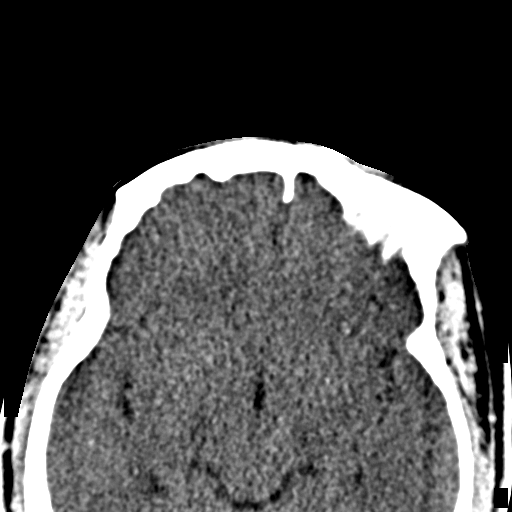
[im 67/81  bone]
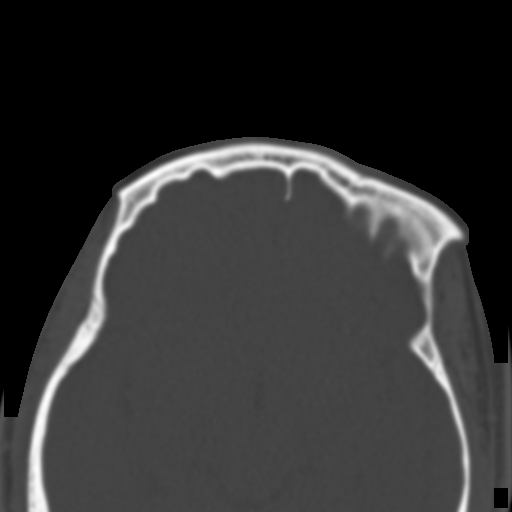
[im 75/81  bone]
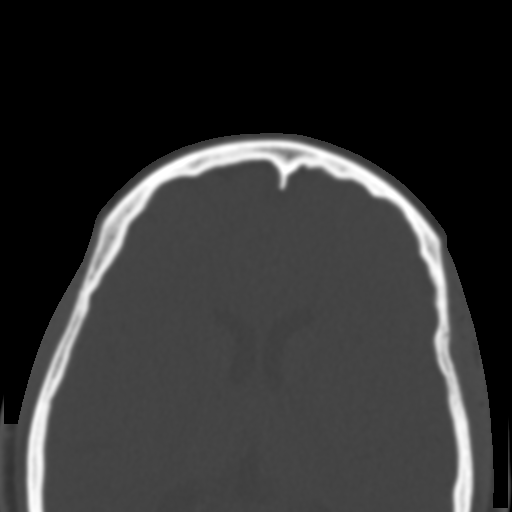

[Series 605: coronals · coronal · 0.32mm/px · 3 of 65 slices shown]
[im 22/65  bone]
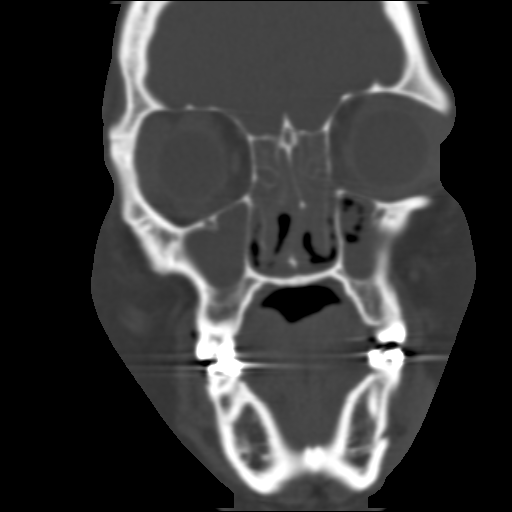
[im 29/65  bone]
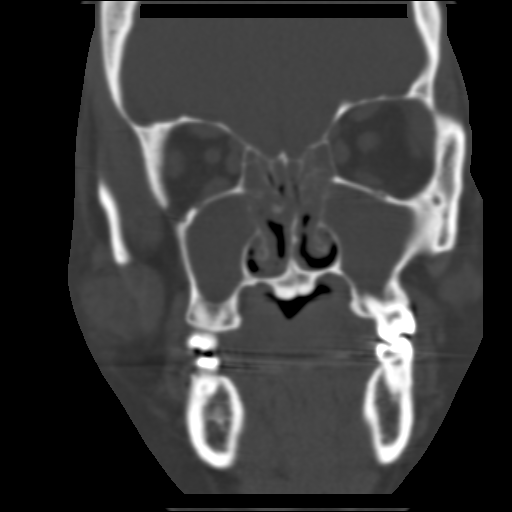
[im 36/65  bone]
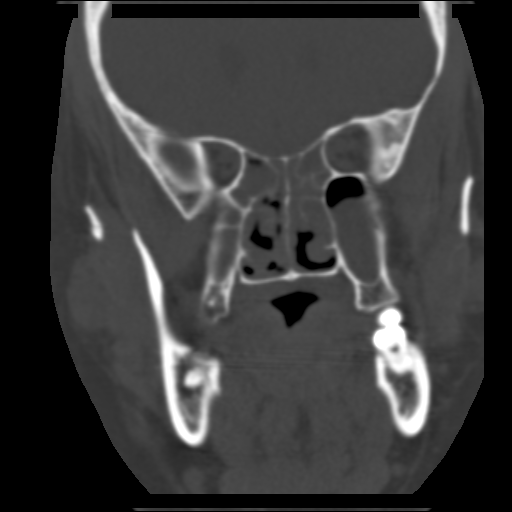

[Series 606: sagittals · sagittal · 0.32mm/px · 3 of 73 slices shown]
[im 25/73  bone]
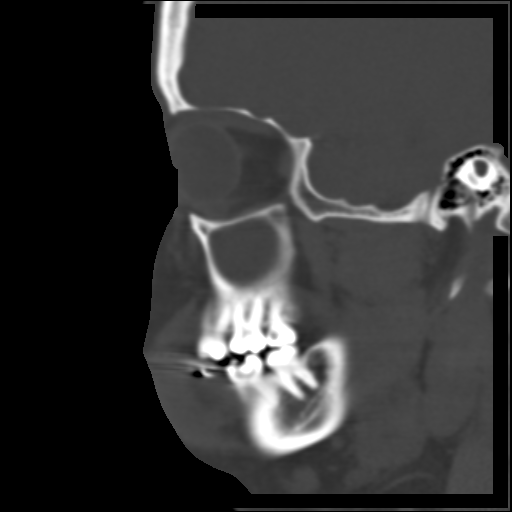
[im 37/73  bone]
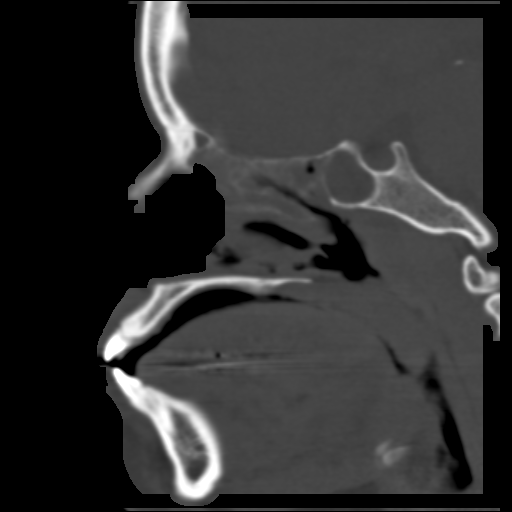
[im 49/73  bone]
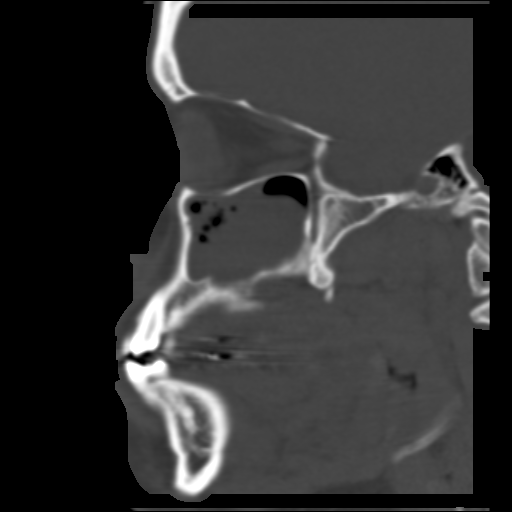

[16 of 47 positions shown; findings below may reference images not displayed]

FINDINGS: No evidence of maxillofacial fracture.

Sinuses are hypoplastic.  Near complete opacification of the
bilateral maxillary, ethmoid, and sphenoid sinuses.  Visualized
mastoid air cells are essentially clear.

Prominence/enlargement of the bilateral lacrimal glands (series
3/images 56 and 59).  Mild preseptal soft tissue swelling.
Bilateral orbits, including the globes and retroconal soft tissues,
are otherwise within normal limits.

Visualized brain parenchyma is within normal limits.

Bilateral cervical lymphadenopathy measuring up to 16 mm short-axis
on the right (series 3/image 18) and 13 mm short axis on the left
(series 3/image 22).
IMPRESSION: Enlargement of the bilateral lacrimal glands with mild overlying
preseptal soft tissue swelling.  While nonspecific, this can be
seen in the setting of sarcoidosis.

Mildly enlarged bilateral cervical lymph nodes, as described above.

Near complete opacification of the bilateral paranasal sinuses.

## 2013-07-31 ENCOUNTER — Ambulatory Visit: Payer: PRIVATE HEALTH INSURANCE | Attending: Internal Medicine | Admitting: *Deleted

## 2013-07-31 DIAGNOSIS — F4323 Adjustment disorder with mixed anxiety and depressed mood: Secondary | ICD-10-CM

## 2013-07-31 NOTE — Progress Notes (Signed)
LCSW met with patient in order to discuss challenges with managing her mood and emotions. Patient stated that she has been doing well but still has these major conflicts and blow ups with her husband. LCSW processed these challenges with patient in order to identify the patient's role and the core of the challenges connected to her chosen role in conflicts with others.  Patient stated that she at times avoids these conflicts.  LCSW has identified a pattern that as core challenges and issues are revealed to the patient she avoids closing sessions.  Patient and LCSW agreed to Clinical Assessment at next session in order to identify diagnosis to identify if there is a need for referral to psychiatry for medication.    Christene Lye MSW, LCSW Duration 75 minutes

## 2013-08-07 ENCOUNTER — Other Ambulatory Visit: Payer: PRIVATE HEALTH INSURANCE

## 2013-08-15 ENCOUNTER — Other Ambulatory Visit: Payer: PRIVATE HEALTH INSURANCE

## 2013-10-05 ENCOUNTER — Ambulatory Visit: Payer: PRIVATE HEALTH INSURANCE | Admitting: Internal Medicine

## 2013-10-18 ENCOUNTER — Inpatient Hospital Stay (HOSPITAL_COMMUNITY)
Admission: AD | Admit: 2013-10-18 | Discharge: 2013-10-18 | Disposition: A | Discharge status: Home / Self Care | Payer: PRIVATE HEALTH INSURANCE | Source: Ambulatory Visit | Attending: Obstetrics & Gynecology | Admitting: Obstetrics & Gynecology

## 2013-10-18 ENCOUNTER — Encounter (HOSPITAL_COMMUNITY): Payer: Self-pay | Admitting: *Deleted

## 2013-10-18 DIAGNOSIS — Z3202 Encounter for pregnancy test, result negative: Secondary | ICD-10-CM | POA: Insufficient documentation

## 2013-10-18 DIAGNOSIS — Z87891 Personal history of nicotine dependence: Secondary | ICD-10-CM | POA: Insufficient documentation

## 2013-10-18 DIAGNOSIS — N946 Dysmenorrhea, unspecified: Secondary | ICD-10-CM | POA: Insufficient documentation

## 2013-10-18 DIAGNOSIS — O209 Hemorrhage in early pregnancy, unspecified: Secondary | ICD-10-CM | POA: Insufficient documentation

## 2013-10-18 DIAGNOSIS — E282 Polycystic ovarian syndrome: Secondary | ICD-10-CM | POA: Diagnosis not present

## 2013-10-18 LAB — URINALYSIS, ROUTINE W REFLEX MICROSCOPIC
Bilirubin Urine: NEGATIVE
GLUCOSE, UA: NEGATIVE mg/dL
Ketones, ur: NEGATIVE mg/dL
LEUKOCYTES UA: NEGATIVE
Nitrite: NEGATIVE
PH: 6 (ref 5.0–8.0)
PROTEIN: 30 mg/dL — AB
SPECIFIC GRAVITY, URINE: 1.02 (ref 1.005–1.030)
Urobilinogen, UA: 0.2 mg/dL (ref 0.0–1.0)

## 2013-10-18 LAB — WET PREP, GENITAL
Clue Cells Wet Prep HPF POC: NONE SEEN
Trich, Wet Prep: NONE SEEN
Yeast Wet Prep HPF POC: NONE SEEN

## 2013-10-18 LAB — URINE MICROSCOPIC-ADD ON

## 2013-10-18 LAB — ABO/RH: ABO/RH(D): O NEG

## 2013-10-18 LAB — CBC WITH DIFFERENTIAL/PLATELET
BASOS ABS: 0 10*3/uL (ref 0.0–0.1)
Basophils Relative: 0 % (ref 0–1)
EOS ABS: 0.1 10*3/uL (ref 0.0–0.7)
EOS PCT: 2 % (ref 0–5)
HEMATOCRIT: 37.5 % (ref 36.0–46.0)
HEMOGLOBIN: 12.6 g/dL (ref 12.0–15.0)
LYMPHS ABS: 1.6 10*3/uL (ref 0.7–4.0)
Lymphocytes Relative: 31 % (ref 12–46)
MCH: 30.9 pg (ref 26.0–34.0)
MCHC: 33.6 g/dL (ref 30.0–36.0)
MCV: 91.9 fL (ref 78.0–100.0)
Monocytes Absolute: 0.5 10*3/uL (ref 0.1–1.0)
Monocytes Relative: 9 % (ref 3–12)
Neutro Abs: 3.1 10*3/uL (ref 1.7–7.7)
Neutrophils Relative %: 58 % (ref 43–77)
Platelets: 239 10*3/uL (ref 150–400)
RBC: 4.08 MIL/uL (ref 3.87–5.11)
RDW: 12.9 % (ref 11.5–15.5)
WBC: 5.3 10*3/uL (ref 4.0–10.5)

## 2013-10-18 LAB — HCG, QUANTITATIVE, PREGNANCY

## 2013-10-18 LAB — HIV ANTIBODY (ROUTINE TESTING W REFLEX): HIV 1&2 Ab, 4th Generation: NONREACTIVE

## 2013-10-18 MED ORDER — IBUPROFEN 600 MG PO TABS: 600.0000 mg | ORAL_TABLET | Freq: Four times a day (QID) | ORAL | Status: DC | PRN

## 2013-10-18 NOTE — MAU Note (Signed)
Patient states she has had 6 positive home pregnancy tests. States she had bleeding and cramping this am after having intercourse last night. Has had sore breasts, occasional nausea but no vomiting. Has a history of irregular periods and has not had one since March. Has PCOS. Has had diarrhea.

## 2013-10-18 NOTE — MAU Provider Note (Signed)
History     CSN: [id number]  Arrival date and time: 10/18/13 1006   First Provider Initiated Contact with Patient 10/18/13 1036      Chief Complaint  Patient presents with  . Possible Pregnancy  . Vaginal Bleeding  . Abdominal Cramping  . Diarrhea   HPI [name] is a 33 y.o. G0P0 at [redacted]w[redacted]d who presents to MAU today with complaint of +HPT x 6, vaginal bleeding and lower abdominal cramping since last night. The patient states that symptoms were first noted following intercourse. She has also noted breast tenderness and loose stools lately. She denies N/V today, fever or sick contacts or vaginal discharge. She states a history of PCOS and very irregular periods.    OB History   Grav Para Term Preterm Abortions TAB SAB Ect Mult Living   1               Past Medical History  Diagnosis Date  . Complication of anesthesia     anesthesia not strong enough, fought during surgery  . Diabetes mellitus without complication     diet controlled  . Depression 2011    treated at the time, no longer on meds  . Shortness of breath     going upstairs, being worked up for sarcoidoisi  . Chronic sinusitis   . Headache(784.0)     migraines (rare now)  . Obesity     Past Surgical History  Procedure Laterality Date  . Video bronchoscopy Bilateral 05/15/2012    pt styated not completed  . Nasal sinus surgery N/A 09/15/2012    Procedure: BILATERAL ENDOSCOPIC MAXILLARY ANTROSTOMY/ETHMOIDECTOMY/FRONTAL SINUS SURGERY and sphenoidotomy;  Surgeon: Serena Colonel, MD;  Location: [facility] OR;  Service: ENT;  Laterality: N/A;    Family History  Problem Relation Age of Onset  . Sarcoidosis Brother   . Brain cancer Paternal Grandfather   . Colon cancer Paternal Aunt     History  Substance Use Topics  . Smoking status: Former Smoker -- 0.25 packs/day for 11 years    Types: Cigarettes    Quit date: 04/07/2012  . Smokeless tobacco: Never Used  . Alcohol Use: Yes     Comment: occasional     Allergies:  Allergies  Allergen Reactions  . Dexamethasone Sodium Phosphate Other (See Comments)    Pt states after receiving injection had severe nosebleeds, bruising, blood blister on lip    Prescriptions prior to admission  Medication Sig Dispense Refill  . acetaminophen (TYLENOL) 325 MG tablet Take 650 mg by mouth every 6 (six) hours as needed.      . cycloSPORINE (RESTASIS) 0.05 % ophthalmic emulsion Place 1 drop into both eyes 2 (two) times daily.      Marland Kitchen gemfibrozil (LOPID) 600 MG tablet Take 1 tablet (600 mg total) by mouth 2 (two) times daily before a meal.  60 tablet  3  . hydrochlorothiazide (HYDRODIURIL) 12.5 MG tablet Take 1 tablet (12.5 mg total) by mouth daily.  90 tablet  3  . insulin NPH-regular Human (HUMULIN 70/30) (70-30) 100 UNIT/ML injection Inject 65 Units into the skin 2 (two) times daily with a meal.  10 mL  11  . metFORMIN (GLUCOPHAGE) 500 MG tablet Take 1 tablet (500 mg total) by mouth 2 (two) times daily with a meal.  180 tablet  3  . Prenatal Vit-Fe Fumarate-FA (PRENATAL MULTIVITAMIN) TABS tablet Take 1 tablet by mouth daily at 12 noon.        Review of Systems  Constitutional: Negative for fever and malaise/fatigue.  Gastrointestinal: Positive for abdominal pain and diarrhea. Negative for nausea, vomiting and constipation.  Genitourinary: Negative for dysuria, urgency and frequency.       + vaginal bleeding   Physical Exam   Blood pressure 130/87, pulse 73, temperature 98.5 F (36.9 C), temperature source Oral, resp. rate 18, height 5' 2.5" (1.588 m), weight 240 lb (108.863 kg), last menstrual period 04/29/2013, SpO2 98.00%, unknown if currently breastfeeding.  Physical Exam  Constitutional: She is oriented to person, place, and time. She appears well-developed and well-nourished. No distress.  HENT:  Head: Normocephalic.  Cardiovascular: Normal rate.   Respiratory: Effort normal.  Genitourinary: Uterus is not enlarged and not tender. Cervix  exhibits no motion tenderness, no discharge and no friability. Right adnexum displays no mass and no tenderness. Left adnexum displays no mass and no tenderness. There is bleeding (small amount of blood in the vaginal vault) around the vagina. No vaginal discharge found.  Neurological: She is alert and oriented to person, place, and time.  Skin: Skin is warm and dry. No erythema.  Psychiatric: She has a normal mood and affect.   Results for orders placed during the hospital encounter of 10/18/13 (from the past 24 hour(s))  URINALYSIS, ROUTINE W REFLEX MICROSCOPIC     Status: Abnormal   Collection Time    10/18/13 10:58 AM      Result Value Ref Range   Color, Urine YELLOW  YELLOW   APPearance CLEAR  CLEAR   Specific Gravity, Urine 1.020  1.005 - 1.030   pH 6.0  5.0 - 8.0   Glucose, UA NEGATIVE  NEGATIVE mg/dL   Hgb urine dipstick MODERATE (*) NEGATIVE   Bilirubin Urine NEGATIVE  NEGATIVE   Ketones, ur NEGATIVE  NEGATIVE mg/dL   Protein, ur 30 (*) NEGATIVE mg/dL   Urobilinogen, UA 0.2  0.0 - 1.0 mg/dL   Nitrite NEGATIVE  NEGATIVE   Leukocytes, UA NEGATIVE  NEGATIVE  URINE MICROSCOPIC-ADD ON     Status: Abnormal   Collection Time    10/18/13 10:58 AM      Result Value Ref Range   Squamous Epithelial / LPF FEW (*) RARE   RBC / HPF 7-10  <3 RBC/hpf   Bacteria, UA RARE  RARE  HCG, QUANTITATIVE, PREGNANCY     Status: None   Collection Time    10/18/13 11:45 AM      Result Value Ref Range   hCG, Beta Chain, Quant, S <1  <5 mIU/mL  WET PREP, GENITAL     Status: Abnormal   Collection Time    10/18/13 11:45 AM      Result Value Ref Range   Yeast Wet Prep HPF POC NONE SEEN  NONE SEEN   Trich, Wet Prep NONE SEEN  NONE SEEN   Clue Cells Wet Prep HPF POC NONE SEEN  NONE SEEN   WBC, Wet Prep HPF POC FEW (*) NONE SEEN  CBC WITH DIFFERENTIAL     Status: None   Collection Time    10/18/13 11:46 AM      Result Value Ref Range   WBC 5.3  4.0 - 10.5 K/uL   RBC 4.08  3.87 - 5.11 MIL/uL    Hemoglobin 12.6  12.0 - 15.0 g/dL   HCT 16.1  09.6 - 04.5 %   MCV 91.9  78.0 - 100.0 fL   MCH 30.9  26.0 - 34.0 pg   MCHC 33.6  30.0 - 36.0 g/dL  RDW 12.9  11.5 - 15.5 %   Platelets 239  150 - 400 K/uL   Neutrophils Relative % 58  43 - 77 %   Neutro Abs 3.1  1.7 - 7.7 K/uL   Lymphocytes Relative 31  12 - 46 %   Lymphs Abs 1.6  0.7 - 4.0 K/uL   Monocytes Relative 9  3 - 12 %   Monocytes Absolute 0.5  0.1 - 1.0 K/uL   Eosinophils Relative 2  0 - 5 %   Eosinophils Absolute 0.1  0.0 - 0.7 K/uL   Basophils Relative 0  0 - 1 %   Basophils Absolute 0.0  0.0 - 0.1 K/uL  ABO/RH     Status: None   Collection Time    10/18/13 11:46 AM      Result Value Ref Range   ABO/RH(D) O NEG       MAU Course  Procedures None  MDM UPT - negative UA, wet prep, GC/Chlamydia, CBC, quant hCG, HIV, ABO/Rh today [place] not performed since quant hCG <1 Patient is tearful that she is not pregnant. States that she has been to a fertility specialist in the past, tried provera and clomid and is on metformin. She voiced understanding of her labs results and follow-up options.  Assessment and Plan  A: Negative pregnancy test Dysmenorrhea PCOS  P: Discharge home Rx for Ibuprofen 600 mg sent to patient's pharmacy Patient advised to follow-up with PCP as needed Bleeding precautions discussed Patient may return to MAU as needed or if her condition were to change or worsen  Marny Lowenstein, PA-C  10/18/2013, 1:19 PM

## 2013-10-18 NOTE — Discharge Instructions (Signed)
Dysmenorrhea °Dysmenorrhea is pain during a menstrual period. You will have pain in the lower belly (abdomen). The pain is caused by the tightening (contracting) of the muscles of the uterus. The pain can be minor or severe. Headache, feeling sick to your stomach (nausea), throwing up (vomiting), or low back pain may occur with this condition. °HOME CARE °· Only take medicine as told by your doctor. °· Place a heating pad or hot water bottle on your lower back or belly. Do not sleep with a heating pad. °· Exercise may help lessen the pain. °· Massage the lower back or belly. °· Stop smoking. °· Avoid alcohol and caffeine. °GET HELP IF:  °· Your pain does not get better with medicine. °· You have pain during sex. °· Your pain gets worse while taking pain medicine. °· Your period bleeding is heavier than normal. °· You keep feeling sick to your stomach or keep throwing up. °GET HELP RIGHT AWAY IF: °You pass out (faint). °Document Released: 04/23/2008 Document Revised: 01/30/2013 Document Reviewed: 07/13/2012 °ExitCare® Patient Information ©2015 [employer]. This information is not intended to replace advice given to you by your health care provider. Make sure you discuss any questions you have with your health care provider. ° °

## 2013-10-19 LAB — GC/CHLAMYDIA PROBE AMP
CT Probe RNA: NEGATIVE
GC PROBE AMP APTIMA: NEGATIVE

## 2013-12-10 ENCOUNTER — Encounter (HOSPITAL_COMMUNITY): Payer: Self-pay | Admitting: *Deleted

## 2014-09-30 ENCOUNTER — Encounter: Payer: Self-pay | Admitting: Family Medicine

## 2014-09-30 ENCOUNTER — Ambulatory Visit: Payer: PRIVATE HEALTH INSURANCE | Attending: Family Medicine | Admitting: Family Medicine

## 2014-09-30 VITALS — BP 132/87 | HR 92 | Temp 99.3°F | Resp 16 | Ht 63.0 in | Wt 244.0 lb

## 2014-09-30 DIAGNOSIS — I10 Essential (primary) hypertension: Secondary | ICD-10-CM | POA: Diagnosis not present

## 2014-09-30 DIAGNOSIS — E1129 Type 2 diabetes mellitus with other diabetic kidney complication: Secondary | ICD-10-CM | POA: Insufficient documentation

## 2014-09-30 DIAGNOSIS — Z794 Long term (current) use of insulin: Secondary | ICD-10-CM | POA: Insufficient documentation

## 2014-09-30 DIAGNOSIS — R809 Proteinuria, unspecified: Secondary | ICD-10-CM | POA: Diagnosis not present

## 2014-09-30 DIAGNOSIS — E282 Polycystic ovarian syndrome: Secondary | ICD-10-CM

## 2014-09-30 DIAGNOSIS — E119 Type 2 diabetes mellitus without complications: Secondary | ICD-10-CM

## 2014-09-30 DIAGNOSIS — N911 Secondary amenorrhea: Secondary | ICD-10-CM | POA: Insufficient documentation

## 2014-09-30 DIAGNOSIS — N912 Amenorrhea, unspecified: Secondary | ICD-10-CM | POA: Diagnosis not present

## 2014-09-30 DIAGNOSIS — Z87891 Personal history of nicotine dependence: Secondary | ICD-10-CM | POA: Insufficient documentation

## 2014-09-30 DIAGNOSIS — E1121 Type 2 diabetes mellitus with diabetic nephropathy: Secondary | ICD-10-CM

## 2014-09-30 LAB — GLUCOSE, POCT (MANUAL RESULT ENTRY): POC GLUCOSE: 254 mg/dL — AB (ref 70–99)

## 2014-09-30 LAB — POCT GLYCOSYLATED HEMOGLOBIN (HGB A1C): Hemoglobin A1C: 11.1

## 2014-09-30 LAB — POCT URINE PREGNANCY: Preg Test, Ur: NEGATIVE

## 2014-09-30 MED ORDER — METFORMIN HCL ER 500 MG PO TB24: 1000.0000 mg | ORAL_TABLET | Freq: Two times a day (BID) | ORAL | Status: DC

## 2014-09-30 MED ORDER — GLUCOSE BLOOD VI STRP: 1.0000 | ORAL_STRIP | Freq: Three times a day (TID) | Status: DC

## 2014-09-30 MED ORDER — ACCU-CHEK AVIVA PLUS W/DEVICE KIT: 1.0000 | PACK | Freq: Three times a day (TID) | Status: DC

## 2014-09-30 MED ORDER — SITAGLIP PHOS-METFORMIN HCL ER 50-1000 MG PO TB24: 1.0000 | ORAL_TABLET | Freq: Every day | ORAL | Status: DC

## 2014-09-30 MED ORDER — ACCU-CHEK SOFTCLIX LANCETS MISC: 1.0000 | Freq: Three times a day (TID) | Status: DC

## 2014-09-30 NOTE — Patient Instructions (Signed)
Tanya Hardy,  Thank you for coming in today. It was a pleasure meeting you. I look forward to being your primary doctor.  1. HTN: No treatment needed  2. Diabetes: Start janumet daily  Continue 70/30 with plan to decrease insulin dosing soon as sugars normalize Referral to diabetes education    3. PCOS: Checking hormone levels   You will be called with lab results   F/u with me in 6 weeks   Dr. Armen Pickup

## 2014-09-30 NOTE — Progress Notes (Signed)
Subjective:    Patient ID: Tanya Hardy, female    DOB: 05-31-1980, 34 y.o.   MRN: [id number] CC: establish care, HTN  HPI  1.CHRONIC HYPERTENSION  Disease Monitoring  Blood pressure range: not checking   Chest pain: no   Dyspnea: no   Claudication: no   Medication compliance: no  Medication Side Effects  Lightheadedness: no   Urinary frequency: no   Edema: no    2. CHRONIC DIABETES dx in 2011, normalized in 2013-2014 with weight loss down to 225#. High sugars returned with some weight gain. Does not take metformin due to nausea and pain with metformin.   Disease Monitoring  Blood Sugar Ranges: not checking   Polyuria: no   Visual problems: no   Medication Compliance: yes  Medication Side Effects  Hypoglycemia: no   3. 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 Normal anatomic evaluation. Has never conceived and would like to.   Current Outpatient Prescriptions on File Prior to Visit  Medication Sig Dispense Refill  . insulin NPH-regular Human (HUMULIN 70/30) (70-30) 100 UNIT/ML injection Inject 65 Units into the skin 2 (two) times daily with a meal. 10 mL 11  . acetaminophen (TYLENOL) 325 MG tablet Take 650 mg by mouth every 6 (six) hours as needed.    . cycloSPORINE (RESTASIS) 0.05 % ophthalmic emulsion Place 1 drop into both eyes 2 (two) times daily.    Marland Kitchen gemfibrozil (LOPID) 600 MG tablet Take 1 tablet (600 mg total) by mouth 2 (two) times daily before a meal. (Patient not taking: Reported on 09/30/2014) 60 tablet 3  . hydrochlorothiazide (HYDRODIURIL) 12.5 MG tablet Take 1 tablet (12.5 mg total) by mouth daily. (Patient not taking: Reported on 09/30/2014) 90 tablet 3  . ibuprofen (ADVIL,MOTRIN) 600 MG tablet Take 1 tablet (600 mg total) by mouth every 6 (six) hours as needed. (Patient not taking: Reported on 09/30/2014) 30 tablet 0  . metFORMIN (GLUCOPHAGE) 500 MG tablet Take 1 tablet (500 mg total) by mouth 2 (two) times daily with a meal.  (Patient not taking: Reported on 09/30/2014) 180 tablet 3  . Prenatal Vit-Fe Fumarate-FA (PRENATAL MULTIVITAMIN) TABS tablet Take 1 tablet by mouth daily at 12 noon.     No current facility-administered medications on file prior to visit.    Social History  Substance Use Topics  . Smoking status: Former Smoker -- 0.25 packs/day for 11 years    Types: Cigarettes    Quit date: 04/07/2012  . Smokeless tobacco: Never Used  . Alcohol Use: Yes     Comment: occasional    Review of Systems  Constitutional: Negative for fever and chills.  Eyes: Negative for visual disturbance.  Respiratory: Negative for shortness of breath.   Cardiovascular: Negative for chest pain.  Gastrointestinal: Negative for abdominal pain and blood in stool.  Genitourinary: Positive for menstrual problem.  Musculoskeletal: Negative for back pain and arthralgias.  Skin: Negative for rash.  Allergic/Immunologic: Negative for immunocompromised state.  Hematological: Negative for adenopathy. Does not bruise/bleed easily.  Psychiatric/Behavioral: Negative for suicidal ideas and dysphoric mood.      Objective:   Physical Exam  Constitutional: She is oriented to person, place, and time. She appears well-developed and well-nourished. No distress.  Morbidly obese   Eyes: Conjunctivae are normal. Pupils are equal, round, and reactive to light.  Cardiovascular: Normal rate, regular rhythm, normal heart sounds and intact distal pulses.   Pulmonary/Chest: Effort normal and breath sounds normal.  Musculoskeletal: She exhibits  no edema.  Neurological: She is alert and oriented to person, place, and time.  Skin: Skin is warm and dry. Rash noted.  Acanthosis around neck   Psychiatric: She has a normal mood and affect.  BP 132/87 mmHg  Pulse 92  Temp(Src) 99.3 F (37.4 C) (Oral)  Resp 16  Ht  (1.6 m)  Wt 244 lb (110.678 kg)  BMI 43.23 kg/m2  SpO2 97%  LMP 08/28/2014  Lab Results  Component Value Date   HGBA1C  11.10 09/30/2014  up from 10.1 on 04/2013 CBG  254   U preg negative     Assessment & Plan:

## 2014-09-30 NOTE — Assessment & Plan Note (Signed)
Normotensive No medication needed

## 2014-09-30 NOTE — Assessment & Plan Note (Signed)
A: Diabetes: uncontrolled P: Start janumet daily  Continue 70/30 with plan to decrease insulin dosing soon as sugars normalize Referral to diabetes education

## 2014-09-30 NOTE — Assessment & Plan Note (Addendum)
A: symptoms of PCOS P: Checking LH/FSH and testerone level per patient request Treating insulin resistance with weight loss    Elevated testosterone level. Normal FSH and LH. This is consistent with the hyperandrogenism (increased female sex hormones) seen in PCOS. Weight loss is the first line treatment. Patient advised to continue metformin if tolerating.

## 2014-10-01 DIAGNOSIS — E1121 Type 2 diabetes mellitus with diabetic nephropathy: Secondary | ICD-10-CM | POA: Insufficient documentation

## 2014-10-01 LAB — MICROALBUMIN / CREATININE URINE RATIO
CREATININE, URINE: 156.4 mg/dL
Microalb Creat Ratio: 549.2 mg/g — ABNORMAL HIGH (ref 0.0–30.0)
Microalb, Ur: 85.9 mg/dL — ABNORMAL HIGH (ref <2.0)

## 2014-10-01 LAB — FSH/LH
FSH: 4.9 m[IU]/mL
LH: 5.8 m[IU]/mL

## 2014-10-01 LAB — TESTOSTERONE: Testosterone: 89 ng/dL — ABNORMAL HIGH (ref 10–70)

## 2014-10-01 NOTE — Assessment & Plan Note (Signed)
Elevated urine microalbumin, in the range for macroalbuminuria consistent with early diabetic nephropathy, diabetic kidney disease,  recommend Ace inhibitor for kidney protection but this medication can cause birth defects and patient hoping to conceive. Best treatment is maintaining normal blood sugar.

## 2014-10-01 NOTE — Addendum Note (Signed)
Addended by: Dessa Phi on: 10/01/2014 08:50 AM   Modules accepted: Kipp Brood

## 2014-10-03 ENCOUNTER — Telehealth: Payer: Self-pay | Admitting: Family Medicine

## 2014-10-10 ENCOUNTER — Other Ambulatory Visit: Payer: Self-pay | Admitting: Family Medicine

## 2014-10-10 DIAGNOSIS — E119 Type 2 diabetes mellitus without complications: Secondary | ICD-10-CM

## 2014-10-10 MED ORDER — GLUCOSE BLOOD VI STRP: 1.0000 | ORAL_STRIP | Freq: Three times a day (TID) | Status: DC

## 2014-10-10 MED ORDER — ONETOUCH ULTRA SYSTEM W/DEVICE KIT: 1.0000 | PACK | Freq: Once | Status: DC

## 2014-10-10 MED ORDER — ONETOUCH ULTRASOFT LANCETS MISC: 1.0000 | Freq: Three times a day (TID) | Status: DC

## 2014-10-25 ENCOUNTER — Ambulatory Visit: Payer: PRIVATE HEALTH INSURANCE | Admitting: *Deleted

## 2014-11-15 ENCOUNTER — Ambulatory Visit: Payer: PRIVATE HEALTH INSURANCE | Admitting: Family Medicine

## 2015-02-12 ENCOUNTER — Telehealth: Payer: Self-pay | Admitting: *Deleted

## 2015-02-12 NOTE — Telephone Encounter (Signed)
Pt requesting Lab results form last visit  Date of birth verified by pt  Lab results given. Pt has F/U appointment with PCP

## 2015-02-24 ENCOUNTER — Encounter: Payer: Self-pay | Admitting: Family Medicine

## 2015-02-24 ENCOUNTER — Ambulatory Visit: Payer: 59 | Attending: Family Medicine | Admitting: Family Medicine

## 2015-02-24 VITALS — BP 136/90 | HR 84 | Temp 99.6°F | Resp 16 | Ht 64.0 in | Wt 234.0 lb

## 2015-02-24 DIAGNOSIS — Z87891 Personal history of nicotine dependence: Secondary | ICD-10-CM | POA: Diagnosis not present

## 2015-02-24 DIAGNOSIS — E282 Polycystic ovarian syndrome: Secondary | ICD-10-CM | POA: Insufficient documentation

## 2015-02-24 DIAGNOSIS — Z6841 Body Mass Index (BMI) 40.0 and over, adult: Secondary | ICD-10-CM | POA: Insufficient documentation

## 2015-02-24 DIAGNOSIS — Z794 Long term (current) use of insulin: Secondary | ICD-10-CM | POA: Diagnosis not present

## 2015-02-24 DIAGNOSIS — Z79899 Other long term (current) drug therapy: Secondary | ICD-10-CM | POA: Diagnosis not present

## 2015-02-24 DIAGNOSIS — N911 Secondary amenorrhea: Secondary | ICD-10-CM

## 2015-02-24 DIAGNOSIS — E1165 Type 2 diabetes mellitus with hyperglycemia: Secondary | ICD-10-CM | POA: Diagnosis not present

## 2015-02-24 DIAGNOSIS — IMO0001 Reserved for inherently not codable concepts without codable children: Secondary | ICD-10-CM

## 2015-02-24 DIAGNOSIS — E119 Type 2 diabetes mellitus without complications: Secondary | ICD-10-CM

## 2015-02-24 LAB — POCT URINALYSIS DIPSTICK
Bilirubin, UA: NEGATIVE
Glucose, UA: 500
Ketones, UA: NEGATIVE
Leukocytes, UA: NEGATIVE
Nitrite, UA: NEGATIVE
Protein, UA: 100
SPEC GRAV UA: 1.015
UROBILINOGEN UA: 0.2
pH, UA: 5.5

## 2015-02-24 LAB — GLUCOSE, POCT (MANUAL RESULT ENTRY)
POC GLUCOSE: 357 mg/dL — AB (ref 70–99)
POC Glucose: 342 mg/dl — AB (ref 70–99)

## 2015-02-24 LAB — POCT GLYCOSYLATED HEMOGLOBIN (HGB A1C): Hemoglobin A1C: 12.3

## 2015-02-24 MED ORDER — METFORMIN HCL 1000 MG PO TABS: 1000.0000 mg | ORAL_TABLET | Freq: Every day | ORAL | Status: DC

## 2015-02-24 MED ORDER — SITAGLIPTIN PHOSPHATE 100 MG PO TABS: 100.0000 mg | ORAL_TABLET | Freq: Every day | ORAL | Status: DC

## 2015-02-24 MED ORDER — INSULIN ASPART 100 UNIT/ML CARTRIDGE (PENFILL): 15.0000 [IU] | Freq: Three times a day (TID) | SUBCUTANEOUS | Status: DC

## 2015-02-24 MED ORDER — INSULIN PEN NEEDLE 31G X 8 MM MISC: 1.0000 "application " | Freq: Every day | Status: DC

## 2015-02-24 MED ORDER — SITAGLIP PHOS-METFORMIN HCL ER 100-1000 MG PO TB24: 1.0000 | ORAL_TABLET | Freq: Every day | ORAL | Status: DC

## 2015-02-24 MED ORDER — INSULIN NPH ISOPHANE & REGULAR (70-30) 100 UNIT/ML ~~LOC~~ SUSP: 65.0000 [IU] | Freq: Two times a day (BID) | SUBCUTANEOUS | Status: DC

## 2015-02-24 MED ORDER — INSULIN ASPART 100 UNIT/ML ~~LOC~~ SOLN
20.0000 [IU] | Freq: Once | SUBCUTANEOUS | Status: AC
  Administered 2015-02-24: 20 [IU] via SUBCUTANEOUS

## 2015-02-24 MED ORDER — METFORMIN HCL 1000 MG PO TABS: 1000.0000 mg | ORAL_TABLET | Freq: Two times a day (BID) | ORAL | Status: DC

## 2015-02-24 MED ORDER — GLUCOSE BLOOD VI STRP: 1.0000 | ORAL_STRIP | Freq: Three times a day (TID) | Status: DC

## 2015-02-24 MED ORDER — SITAGLIP PHOS-METFORMIN HCL ER 50-1000 MG PO TB24: 2.0000 | ORAL_TABLET | Freq: Every day | ORAL | Status: DC

## 2015-02-24 MED FILL — metFORMIN HCL 1000 MG TABS: 1000 | 30 days supply | Qty: 30 | Fill #0

## 2015-02-24 MED FILL — JANUMET XR 100-1,000 MG TAB: 100-1000 | 30 days supply | Qty: 30 | Fill #0

## 2015-02-24 NOTE — Progress Notes (Signed)
F/U DM, medication refills  Had lunch one hr ago  No medication 3 weeks  Last dose of  70/30 yesterday at 9:00pm  No suicidal thought in the past two weeks  No tobacco user

## 2015-02-24 NOTE — Progress Notes (Signed)
Subjective:  Patient ID: Tanya Hardy, female    DOB: [birth year]  Age: 35 y.o. MRN: [id number]  CC: Diabetes   HPI Tanya Hardy presents for    1. CHRONIC DIABETES in setting of PCOS   Disease Monitoring  Blood Sugar Ranges: not checking lately   Polyuria: yes   Visual problems: yes   Medication Compliance: no, no metformin or januvia, 70/30 only   Medication Side Effects  Hypoglycemia: yes,    Preventitive Health Care  Eye Exam: due   Foot Exam: done today   Diet pattern: low carb   Exercise: sometimes,    2. PCOS: reports secondary amenorrhea. LMP in 03/2014. Reports muscular build with prominent veins in arms which is bothersome.   Social History  Substance Use Topics  . Smoking status: Former Smoker -- 0.25 packs/day for 11 years    Types: Cigarettes    Quit date: 04/07/2012  . Smokeless tobacco: Never Used  . Alcohol Use: Yes     Comment: occasional      Outpatient Prescriptions Prior to Visit  Medication Sig Dispense Refill  . acetaminophen (TYLENOL) 325 MG tablet Take 650 mg by mouth every 6 (six) hours as needed.    . Blood Glucose Monitoring Suppl (ONE TOUCH ULTRA SYSTEM KIT) W/DEVICE KIT 1 kit by Does not apply route once. 1 each 0  . cycloSPORINE (RESTASIS) 0.05 % ophthalmic emulsion Place 1 drop into both eyes 2 (two) times daily.    Marland Kitchen glucose blood test strip 1 each by Other route 3 (three) times daily. Use as instructed 100 each 12  . insulin NPH-regular Human (HUMULIN 70/30) (70-30) 100 UNIT/ML injection Inject 65 Units into the skin 2 (two) times daily with a meal. 10 mL 11  . Lancets (ONETOUCH ULTRASOFT) lancets 1 each by Other route 3 (three) times daily. Use as instructed 100 each 12  . Prenatal Vit-Fe Fumarate-FA (PRENATAL MULTIVITAMIN) TABS tablet Take 1 tablet by mouth daily at 12 noon.    . SitaGLIPtin-MetFORMIN HCl (JANUMET XR) 50-1000 MG TB24 Take 1 tablet by mouth daily. 30 tablet 3   No facility-administered medications prior to  visit.    ROS Review of Systems  Constitutional: Negative for fever and chills.  Eyes: Positive for visual disturbance.  Respiratory: Negative for shortness of breath.   Cardiovascular: Negative for chest pain.  Gastrointestinal: Negative for abdominal pain and blood in stool.  Endocrine: Positive for polydipsia and polyphagia.  Musculoskeletal: Negative for back pain and arthralgias.  Skin: Negative for rash.  Allergic/Immunologic: Negative for immunocompromised state.  Neurological: Positive for numbness.  Hematological: Negative for adenopathy. Does not bruise/bleed easily.  Psychiatric/Behavioral: Negative for suicidal ideas and dysphoric mood.    Objective:  BP 136/90 mmHg  Pulse 84  Temp(Src) 99.6 F (37.6 C) (Oral)  Resp 16  Ht '5\' 4"'$  (1.626 m)  Wt 234 lb (106.142 kg)  BMI 40.15 kg/m2  SpO2 96%  BP/Weight 02/24/2015 09/30/2014 6/78/9381  Systolic BP 017 510 258  Diastolic BP 90 87 96  Wt. (Lbs) 234 244 240  BMI 40.15 43.23 43.17   Physical Exam  Constitutional: She is oriented to person, place, and time. She appears well-developed and well-nourished. No distress.  HENT:  Head: Normocephalic and atraumatic.  Cardiovascular: Normal rate, regular rhythm, normal heart sounds and intact distal pulses.   Pulmonary/Chest: Effort normal and breath sounds normal.  Musculoskeletal: She exhibits no edema.  Neurological: She is alert and oriented to person, place, and time.  Skin:  Skin is warm and dry. No rash noted.  Psychiatric: She has a normal mood and affect.   Lab Results  Component Value Date   HGBA1C 12.30 02/24/2015   CBG 357  Treated with 20 U of novolog   Repeat CBG   Assessment & Plan:   Problem List Items Addressed This Visit    Diabetes type 2, uncontrolled ([place]) - Primary    Uncontrolled diabetes in setting of PCOS  Insulin resistance is the main issue. 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    Follow-up: No Follow-up on file.   Boykin Nearing MD

## 2015-02-24 NOTE — Assessment & Plan Note (Signed)
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

## 2015-02-24 NOTE — Assessment & Plan Note (Signed)
Uncontrolled diabetes in setting of PCOS  Insulin resistance is the main issue. Patient will get back on metformin and janumet per orders Changed insulin from 70/30 to novolog with plan to titrate down once sugars improve

## 2015-02-24 NOTE — Patient Instructions (Addendum)
Tanya Hardy was seen today for diabetes.  Diagnoses and all orders for this visit:  Type 2 diabetes mellitus without complication, with long-term current use of insulin (HCC) -     POCT glucose (manual entry) -     POCT glycosylated hemoglobin (Hb A1C) -     Cancel: Ambulatory referral to Endocrinology -     Discontinue: insulin NPH-regular Human (HUMULIN 70/30) (70-30) 100 UNIT/ML injection; Inject 65 Units into the skin 2 (two) times daily with a meal. -     SitaGLIPtin-MetFORMIN HCl (JANUMET XR) 50-1000 MG TB24; Take 2 tablets by mouth daily. -     metFORMIN (GLUCOPHAGE) 1000 MG tablet; Take 1 tablet (1,000 mg total) by mouth 2 (two) times daily with a meal. -     POCT urinalysis dipstick -     insulin aspart (novoLOG) injection 20 Units; Inject 0.2 mLs (20 Units total) into the skin once. -     POCT glucose (manual entry) -     Discontinue: insulin aspart (NOVOLOG PENFILL) cartridge; Inject 15 Units into the skin 3 (three) times daily with meals. -     sitaGLIPtin (JANUVIA) 100 MG tablet; Take 1 tablet (100 mg total) by mouth daily. -     Insulin Pen Needle (B-D ULTRAFINE III SHORT PEN) 31G X 8 MM MISC; 1 application by Does not apply route daily. -     glucose blood test strip; 1 each by Other route 3 (three) times daily. Use as instructed -     insulin aspart (NOVOLOG PENFILL) cartridge; Inject 15 Units into the skin 3 (three) times daily with meals.  PCOS (polycystic ovarian syndrome) -     Cancel: Ambulatory referral to Endocrinology -     Ambulatory referral to Gynecology  Secondary amenorrhea -     Ambulatory referral to Gynecology    Meds:  janumet [phone] mg daily After one week add  Metformin 1000 mg in evening   I have ordered them separately and together, check with pharmacy for best price  Change insulin to novolog 15 U three times daily with food   Diabetes blood sugar goals, check and log sugars  Fasting (in AM before breakfast, 8 hrs of no eating or drinking  (except water or unsweetened coffee or tea): 90-110 2 hrs after meals: < 160,   No low sugars: nothing < 70   F/u in 4 weeks with pharmacist for blood sugar check and log review F/u with me in 8 weeks   Dr. Armen PickupFunches

## 2015-02-25 MED FILL — BD PEN NDL SHORT 31GX5/16: 31G X 8 MM | 25 days supply | Qty: 100 | Fill #0

## 2015-02-28 MED FILL — NOVOLOG FLEXPEN SYRINGE: 100 | 30 days supply | Qty: 15 | Fill #0

## 2015-03-06 ENCOUNTER — Encounter: Payer: Self-pay | Admitting: Obstetrics & Gynecology

## 2015-03-27 ENCOUNTER — Encounter: Payer: PRIVATE HEALTH INSURANCE | Admitting: Obstetrics & Gynecology

## 2015-04-01 MED FILL — **JANUMET XR 100-1,000 MG T: 100-1000 | 30 days supply | Qty: 30 | Fill #1

## 2015-04-01 MED FILL — !NOVOLOG FLEXPEN SYRINGE 1: 100/ML | 30 days supply | Qty: 15 | Fill #1

## 2015-04-01 MED FILL — BD PEN NDL SHORT 31GX5/16: 31G X 8 MM | 25 days supply | Qty: 100 | Fill #1

## 2015-04-08 ENCOUNTER — Encounter: Payer: Self-pay | Admitting: Clinical

## 2015-04-08 NOTE — Progress Notes (Signed)
Depression screen [facility] 2/9 02/24/2015 09/30/2014  Decreased Interest 3 0  Down, Depressed, Hopeless 1 0  PHQ - 2 Score 4 0  Altered sleeping 3 -  Tired, decreased energy 2 -  Change in appetite 2 -  Feeling bad or failure about yourself  0 -  Trouble concentrating 2 -  Moving slowly or fidgety/restless 0 -  Suicidal thoughts 0 -  PHQ-9 Score 13 -    GAD 7 : Generalized Anxiety Score 02/24/2015  Nervous, Anxious, on Edge 0  Control/stop worrying 0  Worry too much - different things 1  Trouble relaxing 0  Restless 0  Easily annoyed or irritable 3  Afraid - awful might happen 0  Total GAD 7 Score 4

## 2015-05-15 MED FILL — **JANUMET XR 100-1,000 MG T: 100-1000 | 30 days supply | Qty: 30 | Fill #2

## 2015-05-15 MED FILL — !NOVOLOG FLEXPEN SYRINGE 1: 100/ML | 30 days supply | Qty: 15 | Fill #2

## 2015-05-16 MED FILL — BD PEN NDL SHORT 31GX5/16: 31G X 8 MM | 25 days supply | Qty: 100 | Fill #2

## 2015-05-29 ENCOUNTER — Encounter: Payer: Self-pay | Admitting: Family Medicine

## 2015-05-29 ENCOUNTER — Ambulatory Visit: Payer: 59 | Attending: Family Medicine | Admitting: Family Medicine

## 2015-05-29 VITALS — BP 142/91 | HR 101 | Temp 99.3°F | Resp 16 | Ht 63.0 in | Wt 247.0 lb

## 2015-05-29 DIAGNOSIS — I10 Essential (primary) hypertension: Secondary | ICD-10-CM | POA: Diagnosis not present

## 2015-05-29 DIAGNOSIS — Z794 Long term (current) use of insulin: Secondary | ICD-10-CM

## 2015-05-29 DIAGNOSIS — IMO0001 Reserved for inherently not codable concepts without codable children: Secondary | ICD-10-CM

## 2015-05-29 DIAGNOSIS — E1165 Type 2 diabetes mellitus with hyperglycemia: Secondary | ICD-10-CM

## 2015-05-29 DIAGNOSIS — N911 Secondary amenorrhea: Secondary | ICD-10-CM

## 2015-05-29 LAB — POCT GLYCOSYLATED HEMOGLOBIN (HGB A1C): HEMOGLOBIN A1C: 11

## 2015-05-29 LAB — GLUCOSE, POCT (MANUAL RESULT ENTRY): POC Glucose: 130 mg/dl — AB (ref 70–99)

## 2015-05-29 MED ORDER — EXENATIDE 5 MCG/0.02ML ~~LOC~~ SOPN: 5.0000 ug | PEN_INJECTOR | Freq: Two times a day (BID) | SUBCUTANEOUS | Status: DC

## 2015-05-29 MED ORDER — SITAGLIP PHOS-METFORMIN HCL ER 100-1000 MG PO TB24: 1.0000 | ORAL_TABLET | Freq: Every day | ORAL | Status: DC

## 2015-05-29 MED ORDER — METFORMIN HCL 1000 MG PO TABS: 1000.0000 mg | ORAL_TABLET | Freq: Every day | ORAL | Status: DC

## 2015-05-29 MED ORDER — INSULIN ASPART 100 UNIT/ML CARTRIDGE (PENFILL): 15.0000 [IU] | Freq: Three times a day (TID) | SUBCUTANEOUS | Status: DC

## 2015-05-29 MED ORDER — HYDROCHLOROTHIAZIDE 12.5 MG PO TABS: 12.5000 mg | ORAL_TABLET | Freq: Every day | ORAL | Status: DC

## 2015-05-29 MED ORDER — INSULIN PEN NEEDLE 31G X 8 MM MISC: 1.0000 "application " | Freq: Every day | Status: DC

## 2015-05-29 NOTE — Assessment & Plan Note (Signed)
A; HTN with diabetes P: Add HCTZ 12.5 mg daily

## 2015-05-29 NOTE — Addendum Note (Signed)
Addended by: Dessa PhiFUNCHES, Casidy [place] on: 05/29/2015 05:58 PM   Modules accepted: Orders

## 2015-05-29 NOTE — Progress Notes (Signed)
F/U DM Pt stated taking medication as prescribed  Glucose running 120-200 No tobacco user  No suicidal thoughts in the past two weeks

## 2015-05-29 NOTE — Progress Notes (Signed)
Subjective:  Patient ID: Tanya Hardy, female    DOB: Sep 29, 1980  Age: 35 y.o. MRN: [id number]  CC: Diabetes   HPI Larrie Lucia has obesity, PCOS, sarcoidosis, uncontrolled diabetes she presents for    1. CHRONIC DIABETES in setting of PCOS   Disease Monitoring  Blood Sugar Ranges: not checking lately   Polyuria: yes   Visual problems: no   Medication Compliance: yes except no metformin 1000 mg nightly  Medication Side Effects  Hypoglycemia: no    Preventitive Health Care  Eye Exam: due   Foot Exam: done today   Diet pattern: low carb   Exercise: yes, joined a gym    2. HTN: no meds. No HA, CP, SOB. Slight swelling in legs with prominent veins in arms.   Social History  Substance Use Topics  . Smoking status: Former Smoker -- 0.25 packs/day for 11 years    Types: Cigarettes    Quit date: 04/07/2012  . Smokeless tobacco: Never Used  . Alcohol Use: Yes     Comment: occasional      Outpatient Prescriptions Prior to Visit  Medication Sig Dispense Refill  . acetaminophen (TYLENOL) 325 MG tablet Take 650 mg by mouth every 6 (six) hours as needed. Reported on 02/24/2015    . Blood Glucose Monitoring Suppl (ONE TOUCH ULTRA SYSTEM KIT) W/DEVICE KIT 1 kit by Does not apply route once. (Patient not taking: Reported on 02/24/2015) 1 each 0  . cycloSPORINE (RESTASIS) 0.05 % ophthalmic emulsion Place 1 drop into both eyes 2 (two) times daily. Reported on 02/24/2015    . glucose blood test strip 1 each by Other route 3 (three) times daily. Use as instructed 100 each 12  . insulin aspart (NOVOLOG PENFILL) cartridge Inject 15 Units into the skin 3 (three) times daily with meals. 15 mL 11  . Insulin Pen Needle (B-D ULTRAFINE III SHORT PEN) 31G X 8 MM MISC 1 application by Does not apply route daily. 100 each 3  . Lancets (ONETOUCH ULTRASOFT) lancets 1 each by Other route 3 (three) times daily. Use as instructed (Patient not taking: Reported on 02/24/2015) 100 each 12  . metFORMIN  (GLUCOPHAGE) 1000 MG tablet Take 1 tablet (1,000 mg total) by mouth daily after supper. 30 tablet 5  . Prenatal Vit-Fe Fumarate-FA (PRENATAL MULTIVITAMIN) TABS tablet Take 1 tablet by mouth daily at 12 noon. Reported on 02/24/2015    . SitaGLIPtin-MetFORMIN HCl (JANUMET XR) [phone] MG TB24 Take 1 tablet by mouth daily. 30 tablet 5   No facility-administered medications prior to visit.    ROS Review of Systems  Constitutional: Negative for fever and chills.  Eyes: Negative for visual disturbance.  Respiratory: Negative for shortness of breath.   Cardiovascular: Negative for chest pain.  Gastrointestinal: Negative for abdominal pain and blood in stool.  Endocrine: Negative for polydipsia and polyphagia.  Musculoskeletal: Negative for back pain and arthralgias.  Skin: Negative for rash.  Allergic/Immunologic: Negative for immunocompromised state.  Neurological: Positive for numbness (in feet comes and goes ).  Hematological: Negative for adenopathy. Does not bruise/bleed easily.  Psychiatric/Behavioral: Negative for suicidal ideas and dysphoric mood.    Objective:  BP 142/91 mmHg  Pulse 101  Temp(Src) 99.3 F (37.4 C) (Oral)  Resp 16  Ht '5\' 3"'$  (1.6 m)  Wt 247 lb (112.038 kg)  BMI 43.76 kg/m2  SpO2 98%  BP/Weight 05/29/2015 02/24/2015 6/43/3295  Systolic BP 188 416 606  Diastolic BP 91 90 87  Wt. (Lbs) 247 234  244  BMI 43.76 40.15 43.23   Physical Exam  Constitutional: She is oriented to person, place, and time. She appears well-developed and well-nourished. No distress.  HENT:  Head: Normocephalic and atraumatic.  Cardiovascular: Normal rate, regular rhythm, normal heart sounds and intact distal pulses.   Pulmonary/Chest: Effort normal and breath sounds normal.  Musculoskeletal: She exhibits no edema.  Neurological: She is alert and oriented to person, place, and time.  Skin: Skin is warm and dry. No rash noted.  Psychiatric: She has a normal mood and affect.   Lab  Results  Component Value Date   HGBA1C 12.30 02/24/2015   Lab Results  Component Value Date   HGBA1C 11.0 05/29/2015   CBG 130 Assessment & Plan:   Problem List Items Addressed This Visit    Essential hypertension, benign (Chronic)    A; HTN with diabetes P: Add HCTZ 12.5 mg daily       Relevant Medications   hydrochlorothiazide (HYDRODIURIL) 12.5 MG tablet   Diabetes type 2, uncontrolled (HCC) - Primary (Chronic)    Diabetes is improving A1c is down to 11  Add back PM metformin 1000 mg nightly Add byetta 5 mcg twice daily with meals   If sugars start to get low, decrease novolog dose by 3 U, so from 15 U to 12 U.   Diabetes blood sugar goals  Fasting (in AM before breakfast, 8 hrs of no eating or drinking (except water or unsweetened coffee or tea): 90-110 2 hrs after meals: < 160,   No low sugars: nothing < 70   F/u in 6 weeks for diabetes and CBG review       Relevant Medications   metFORMIN (GLUCOPHAGE) 1000 MG tablet   insulin aspart (NOVOLOG PENFILL) cartridge   SitaGLIPtin-MetFORMIN HCl (JANUMET XR) [phone] MG TB24   exenatide (BYETTA 5 MCG PEN) 5 MCG/0.02ML SOPN injection   Insulin Pen Needle (B-D ULTRAFINE III SHORT PEN) 31G X 8 MM MISC   Other Relevant Orders   HgB A1c (Completed)   Glucose (CBG) (Completed)      No orders of the defined types were placed in this encounter.    Follow-up: No Follow-up on file.   Boykin Nearing MD

## 2015-05-29 NOTE — Assessment & Plan Note (Signed)
Diabetes is improving A1c is down to 11  Add back PM metformin 1000 mg nightly Add byetta 5 mcg twice daily with meals   If sugars start to get low, decrease novolog dose by 3 U, so from 15 U to 12 U.   Diabetes blood sugar goals  Fasting (in AM before breakfast, 8 hrs of no eating or drinking (except water or unsweetened coffee or tea): 90-110 2 hrs after meals: < 160,   No low sugars: nothing < 70   F/u in 6 weeks for diabetes and CBG review

## 2015-05-29 NOTE — Patient Instructions (Addendum)
Tanya Hardy was seen today for diabetes.  Diagnoses and all orders for this visit:  Uncontrolled type 2 diabetes mellitus without complication, with long-term current use of insulin (HCC) -     HgB A1c -     Glucose (CBG) -     metFORMIN (GLUCOPHAGE) 1000 MG tablet; Take 1 tablet (1,000 mg total) by mouth daily after supper. -     insulin aspart (NOVOLOG PENFILL) cartridge; Inject 15 Units into the skin 3 (three) times daily with meals. -     SitaGLIPtin-MetFORMIN HCl (JANUMET XR) [phone] MG TB24; Take 1 tablet by mouth daily. -     exenatide (BYETTA 5 MCG PEN) 5 MCG/0.02ML SOPN injection; Inject 0.02 mLs (5 mcg total) into the skin 2 (two) times daily with a meal. -     Insulin Pen Needle (B-D ULTRAFINE III SHORT PEN) 31G X 8 MM MISC; 1 application by Does not apply route 5 (five) times daily.  Essential hypertension, benign -     hydrochlorothiazide (HYDRODIURIL) 12.5 MG tablet; Take 1 tablet (12.5 mg total) by mouth daily.   Diabetes is improving A1c is down to 11  Add back PM metformin 1000 mg nightly Add byetta 5 mcg twice daily with meals   If sugars start to get low, decrease novolog dose by 3 U, so from 15 U to 12 U.   Diabetes blood sugar goals  Fasting (in AM before breakfast, 8 hrs of no eating or drinking (except water or unsweetened coffee or tea): 90-110 2 hrs after meals: < 160,   No low sugars: nothing < 70   F/u in 4-6 weeks for pap   Dr. Armen PickupFunches

## 2015-05-30 MED FILL — ?HYDROCHLOROTHIAZIDE 12.5MG: 12.5 | 30 days supply | Qty: 30 | Fill #0

## 2015-05-30 MED FILL — !BYETTA 5 MCG DOSE PEN INJ: 5 | 27 days supply | Qty: 1 | Fill #0

## 2015-05-30 MED FILL — metFORMIN HCL 1000 MG TABS: 1000 | 30 days supply | Qty: 30 | Fill #0

## 2015-05-30 MED FILL — !NOVOLOG FLEXPEN SYRINGE 1: 100/ML | 30 days supply | Qty: 15 | Fill #0

## 2015-05-30 MED FILL — !JANUMET XR 100-1000 MG TAB: 100-1000 | 30 days supply | Qty: 30 | Fill #0

## 2015-07-09 MED FILL — ?METFORMIN HCL 1,000 MG TAB: 1000 | 30 days supply | Qty: 30 | Fill #1

## 2015-07-09 MED FILL — !NOVOLOG FLEXPEN SYRINGE 1: 100/ML | 30 days supply | Qty: 15 | Fill #1

## 2015-07-09 MED FILL — !JANUMET XR 100-1000 MG TAB: 100-1000 | 30 days supply | Qty: 30 | Fill #1

## 2015-08-07 ENCOUNTER — Encounter: Payer: 59 | Admitting: Obstetrics & Gynecology

## 2015-08-19 MED FILL — ?HYDROCHLOROTHIAZIDE 12.5MG: 12.5 | 30 days supply | Qty: 30 | Fill #1

## 2015-08-19 MED FILL — BYETTA 5 MCG DOSE PEN INJ: 5 | 27 days supply | Qty: 1 | Fill #1

## 2015-08-19 MED FILL — !NOVOLOG 100UNITS/ML VIAL: 100/ML | 21 days supply | Qty: 10 | Fill #0

## 2015-08-19 MED FILL — metFORMIN HCL 1000 MG TABS: 1000 | 30 days supply | Qty: 30 | Fill #2

## 2015-08-19 MED FILL — !JANUMET XR 100-1000 MG TAB: 100-1000 | 29 days supply | Qty: 29 | Fill #2

## 2015-10-03 MED FILL — !JANUMET XR 100-1000 MG TAB: 100-1000 | 14 days supply | Qty: 14 | Fill #3

## 2015-10-03 MED FILL — metFORMIN HCL 1000 MG TABS: 1000 | 30 days supply | Qty: 30 | Fill #3

## 2015-10-03 MED FILL — ?HYDROCHLOROTHIAZIDE 12.5MG: 12.5 | 30 days supply | Qty: 30 | Fill #2

## 2015-10-03 MED FILL — !NOVOLOG 100UNITS/ML VIAL: 100/ML | 21 days supply | Qty: 10 | Fill #1

## 2015-10-06 ENCOUNTER — Other Ambulatory Visit: Payer: Self-pay

## 2015-10-06 DIAGNOSIS — Z794 Long term (current) use of insulin: Principal | ICD-10-CM

## 2015-10-06 DIAGNOSIS — E1165 Type 2 diabetes mellitus with hyperglycemia: Principal | ICD-10-CM

## 2015-10-06 DIAGNOSIS — IMO0001 Reserved for inherently not codable concepts without codable children: Secondary | ICD-10-CM

## 2015-10-06 MED ORDER — INSULIN ASPART 100 UNIT/ML CARTRIDGE (PENFILL): 15.0000 [IU] | Freq: Three times a day (TID) | SUBCUTANEOUS | 3 refills | Status: DC

## 2015-10-06 NOTE — Telephone Encounter (Signed)
Script printed for Novolog penfill for pass

## 2015-10-31 MED FILL — BYETTA 5 MCG DOSE PEN INJ: 5 | 27 days supply | Qty: 1 | Fill #2

## 2015-10-31 MED FILL — JANUMET XR 100-1,000 MG TAB: 100-1000 | 14 days supply | Qty: 14 | Fill #4

## 2015-10-31 MED FILL — !NOVOLOG 100UNITS/ML VIAL: 100/ML | 21 days supply | Qty: 10 | Fill #2

## 2015-10-31 MED FILL — metFORMIN HCL 1000 MG TABS: 1000 | 30 days supply | Qty: 30 | Fill #4

## 2015-11-24 ENCOUNTER — Telehealth: Payer: Self-pay | Admitting: Family Medicine

## 2015-11-24 DIAGNOSIS — N911 Secondary amenorrhea: Secondary | ICD-10-CM

## 2015-11-24 DIAGNOSIS — E1165 Type 2 diabetes mellitus with hyperglycemia: Secondary | ICD-10-CM

## 2015-11-24 DIAGNOSIS — IMO0001 Reserved for inherently not codable concepts without codable children: Secondary | ICD-10-CM

## 2015-11-24 DIAGNOSIS — Z794 Long term (current) use of insulin: Secondary | ICD-10-CM

## 2015-11-24 DIAGNOSIS — E282 Polycystic ovarian syndrome: Secondary | ICD-10-CM

## 2015-11-24 MED FILL — BYETTA 5 MCG DOSE PEN INJ: 5 | 27 days supply | Qty: 1 | Fill #3

## 2015-11-24 MED FILL — !NOVOLOG 100UNITS/ML VIAL: 100/ML | 21 days supply | Qty: 10 | Fill #3

## 2015-11-24 MED FILL — metFORMIN HCL 1000 MG TABS: 1000 | 30 days supply | Qty: 30 | Fill #5

## 2015-11-24 NOTE — Telephone Encounter (Signed)
Patient called the office to speak with PCP regarding a referral to Endocrinologist that was placed for her a few months back. Pt has question regarding it and needs to speak with PCP. No appt available. Please follow up.   Thank you.

## 2015-11-26 NOTE — Telephone Encounter (Signed)
Contacted pt to make aware of the situation and that the referrals has been placed again pt didn't answer and was unable to lvm

## 2015-11-26 NOTE — Telephone Encounter (Signed)
The endo referral was likely canceled due to lack of insurance (there are no comments in the note section)  The gyn referral for accepted but I do not see that patient has seen them. There referral was then canceled.    I have placed both referrals again Patient will be contacted by referral coordinator

## 2015-11-27 NOTE — Telephone Encounter (Signed)
Pt was called on 10/19 a VM was left informing pt of referral being paced again.

## 2015-12-17 ENCOUNTER — Ambulatory Visit (INDEPENDENT_AMBULATORY_CARE_PROVIDER_SITE_OTHER): Payer: 59 | Admitting: Endocrinology

## 2015-12-17 ENCOUNTER — Encounter: Payer: Self-pay | Admitting: Endocrinology

## 2015-12-17 VITALS — BP 128/86 | HR 95 | Ht 63.0 in | Wt 235.0 lb

## 2015-12-17 DIAGNOSIS — E1165 Type 2 diabetes mellitus with hyperglycemia: Secondary | ICD-10-CM | POA: Diagnosis not present

## 2015-12-17 DIAGNOSIS — IMO0001 Reserved for inherently not codable concepts without codable children: Secondary | ICD-10-CM

## 2015-12-17 DIAGNOSIS — Z794 Long term (current) use of insulin: Secondary | ICD-10-CM

## 2015-12-17 DIAGNOSIS — N912 Amenorrhea, unspecified: Secondary | ICD-10-CM | POA: Insufficient documentation

## 2015-12-17 LAB — POCT GLYCOSYLATED HEMOGLOBIN (HGB A1C): HEMOGLOBIN A1C: 12.2

## 2015-12-17 MED ORDER — INSULIN ASPART 100 UNIT/ML FLEXPEN: 60.0000 [IU] | PEN_INJECTOR | Freq: Three times a day (TID) | SUBCUTANEOUS | 11 refills | Status: DC

## 2015-12-17 MED ORDER — PREDNISONE 20 MG PO TABS: ORAL_TABLET | ORAL | 0 refills | Status: DC

## 2015-12-17 MED FILL — !NOVOLOG 100UNITS/ML VIAL: 100/ML | 33 days supply | Qty: 60 | Fill #0

## 2015-12-17 MED FILL — predniSONE 20 MG TABS: 20 | 1 days supply | Qty: 1 | Fill #0

## 2015-12-17 NOTE — Progress Notes (Signed)
Subjective:    Patient ID: Tanya Hardy, female    DOB: 28-Oct-1980, 35 y.o.   MRN: [id number]  HPI pt states DM was dx'ed in 2010; she has mild if any neuropathy of the lower extremities; she is unaware of any associated chronic complications; she has been on insulin since soon after dx; pt says her diet and exercise are good; she has never had GDM (G0), pancreatitis, severe hypoglycemia or DKA.  No recent steroids.  She says cbg's are persistently in the 300's, even when she takes 40 units 3 times a day (just before each meal).   Past Medical History:  Diagnosis Date  . Chronic sinusitis   . Complication of anesthesia    anesthesia not strong enough, fought during surgery  . Depression 2011   treated at the time, no longer on meds  . Diabetes mellitus without complication (HCC)    diet controlled  . Headache(784.0)    migraines (rare now)  . Obesity   . Shortness of breath    going upstairs, being worked up for [place]    Past Surgical History:  Procedure Laterality Date  . NASAL SINUS SURGERY N/A 09/15/2012   Procedure: BILATERAL ENDOSCOPIC MAXILLARY ANTROSTOMY/ETHMOIDECTOMY/FRONTAL SINUS SURGERY and sphenoidotomy;  Surgeon: Izora Gala, MD;  Location: [facility];  Service: ENT;  Laterality: N/A;  . VIDEO BRONCHOSCOPY Bilateral 05/15/2012   pt styated not completed    Social History   Social History  . Marital status: Married    Spouse name: N/A  . Number of children: 0  . Years of education: N/A   Occupational History  . Works at a [employer]  . Smoking status: Former Smoker    Packs/day: 0.25    Years: 11.00    Types: Cigarettes    Quit date: 04/07/2012  . Smokeless tobacco: Never Used  . Alcohol use Yes     Comment: occasional  . Drug use: No  . Sexual activity: Yes     Comment: trying to get pregnant   Other Topics Concern  . Not on file   Social History Narrative  . No narrative on file    Current Outpatient  Prescriptions on File Prior to Visit  Medication Sig Dispense Refill  . Blood Glucose Monitoring Suppl (ONE TOUCH ULTRA SYSTEM KIT) W/DEVICE KIT 1 kit by Does not apply route once. 1 each 0  . exenatide (BYETTA 5 MCG PEN) 5 MCG/0.02ML SOPN injection Inject 0.02 mLs (5 mcg total) into the skin 2 (two) times daily with a meal. 1.2 mL 3  . glucose blood test strip 1 each by Other route 3 (three) times daily. Use as instructed 100 each 12  . hydrochlorothiazide (HYDRODIURIL) 12.5 MG tablet Take 1 tablet (12.5 mg total) by mouth daily. 30 tablet 5  . Lancets (ONETOUCH ULTRASOFT) lancets 1 each by Other route 3 (three) times daily. Use as instructed 100 each 12  . metFORMIN (GLUCOPHAGE) 1000 MG tablet Take 1 tablet (1,000 mg total) by mouth daily after supper. 30 tablet 5  . SitaGLIPtin-MetFORMIN HCl (JANUMET XR) [phone] MG TB24 Take 1 tablet by mouth daily. 30 tablet 5   No current facility-administered medications on file prior to visit.     Allergies  Allergen Reactions  . Dexamethasone Sodium Phosphate Other (See Comments)    Pt states after receiving injection had severe nosebleeds, bruising, blood blister on lip    Family History  Problem Relation Age of Onset  .  Hypertension Mother   . Sarcoidosis Brother   . Hypertension Brother   . Brain cancer Paternal Grandfather   . Colon cancer Paternal Aunt   . Diabetes Neg Hx     BP 128/86   Pulse 95   Ht _0  (1.6 m)   Wt 235 lb (106.6 kg)   SpO2 97%   BMI 41.63 kg/m    Review of Systems denies blurry vision, chest pain, sob, n/v, muscle cramps, cold intolerance, rhinorrhea, and easy bruising.  She has no menses x 1 year (she has had only a few menses since menarche).  She has chronic weight gain, depression, excessive diaphoresis, polyuria, and intermittent headache.     Objective:   Physical Exam VS: see vs page GEN: no distress.  Morbid obesity.   HEAD: head: no deformity eyes: no periorbital swelling, no  proptosis external nose and ears are normal mouth: no lesion seen NECK: supple, thyroid is not enlarged CHEST WALL: no deformity LUNGS: clear to auscultation CV: reg rate and rhythm, no murmur ABD: abdomen is soft, nontender.  no hepatosplenomegaly.  not distended.  no hernia MUSCULOSKELETAL: muscle bulk and strength are grossly normal.  no obvious joint swelling.  gait is normal and steady EXTEMITIES: no deformity.  no ulcer on the feet.  feet are of normal color and temp.  no edema PULSES: dorsalis pedis intact bilat.  no carotid bruit NEURO:  cn 2-12 grossly intact.   readily moves all 4's.  sensation is intact to touch on the feet.   SKIN:  Normal texture and temperature.  No rash or suspicious lesion is visible.   NODES:  None palpable at the neck PSYCH: alert, well-oriented.  Does not appear anxious nor depressed.  Lab Results  Component Value Date   HGBA1C 12.2 12/17/2015   i personally reviewed electrocardiogram tracing (09/16/12): Indication: SOB Impression: normal  I have reviewed outside records, and summarized: Pt was noted to have severely elevated a1c, and referred here.  byetta was added.  She was also rx'ed HCTZ for HTN.      Assessment & Plan:  Insulin-requiring type 2 DM, severe exacerbation: neg fhx and poor control raise question of type 1.   Obesity: new to me.   Patient is advised the following: Patient Instructions  good diet and exercise significantly improve the control of your diabetes.  please let me know if you wish to be referred to a dietician.  high blood sugar is very risky to your health.  you should see an eye doctor and dentist every year.  It is very important to get all recommended vaccinations.   Controlling your blood pressure and cholesterol drastically reduces the damage diabetes does to your body.  Those who smoke should quit.  Please discuss these with your doctor.   check your blood sugar once a day.  vary the time of day when you check,  between before the 3 meals, and at bedtime.  also check if you have symptoms of your blood sugar being too high or too low.  please keep a record of the readings and bring it to your next appointment here (or you can bring the meter itself).  You can write it on any piece of paper.  please call us sooner if your blood sugar goes below 70, or if you have a lot of readings over 200.   Please see a gynecology specialist.  you will receive a phone call, about a day and time for an appointment  In view of your medical condition, you should avoid pregnancy until we have decided it is safe.   For now, please increase the novolog to 60 units 3 times a day (just before each meal). Please continue the same oral medications   You should do a "dexamethasone suppression test."  For this, you would take prednisone 20 mg at 10 pm (I have sent a prescription to your pharmacy), then come in for a "cortisol" blood test the next morning before 9 am.  You do not need to be fasting for this test.  Please come back for a follow-up appointment in 1 month.      Bariatric Surgery You have so much to gain by losing weight.  You may have already tried every diet and exercise plan imaginable.  And, you may have sought advice from your family physician, too.   Sometimes, in spite of such diligent efforts, you may not be able to achieve long-term results by yourself.  In cases of severe obesity, bariatric or weight loss surgery is a proven method of achieving long-term weight control.  Our Services Our bariatric surgery programs offer our patients new hope and long-term weight-loss solution.  Since introducing our services in 2003, we have conducted more than 2,400 successful procedures.  Our program is designated as a Programmer, multimedia by the Metabolic and Bariatric Surgery Accreditation and Quality Improvement Program (MBSAQIP), a IT trainer that sets rigorous patient safety and outcome standards.  Our program  is also designated as a Ecologist by [employer].   Our exceptional weight-loss surgery team specializes in diagnosis, treatment, follow-up care, and ongoing support for our patients with severe weight loss challenges.  We currently offer laparoscopic sleeve gastrectomy, gastric bypass, and adjustable gastric band (LAP-BAND).    Attend our [place] Choosing to undergo a bariatric procedure is a big decision, and one that should not be taken lightly.  You now have two options in how you learn about weight-loss surgery - in person or online.  Our objective is to ensure you have all of the information that you need to evaluate the advantages and obligations of this life changing procedure.  Please note that you are not alone in this process, and our experienced team is ready to assist and answer all of your questions.  There are several ways to register for a seminar (either on-line or in person): 1)  Call [phone] 2) Go on-line to [facility] and register for either type of seminar.  [email or web address]

## 2015-12-17 NOTE — Patient Instructions (Addendum)
good diet and exercise significantly improve the control of your diabetes.  please let me know if you wish to be referred to a dietician.  high blood sugar is very risky to your health.  you should see an eye doctor and dentist every year.  It is very important to get all recommended vaccinations.   Controlling your blood pressure and cholesterol drastically reduces the damage diabetes does to your body.  Those who smoke should quit.  Please discuss these with your doctor.   check your blood sugar once a day.  vary the time of day when you check, between before the 3 meals, and at bedtime.  also check if you have symptoms of your blood sugar being too high or too low.  please keep a record of the readings and bring it to your next appointment here (or you can bring the meter itself).  You can write it on any piece of paper.  please call us sooner if your blood sugar goes below 70, or if you have a lot of readings over 200.   Please see a gynecology specialist.  you will receive a phone call, about a day and time for an appointment In view of your medical condition, you should avoid pregnancy until we have decided it is safe.   For now, please increase the novolog to 60 units 3 times a day (just before each meal). Please continue the same oral medications   You should do a "dexamethasone suppression test."  For this, you would take prednisone 20 mg at 10 pm (I have sent a prescription to your pharmacy), then come in for a "cortisol" blood test the next morning before 9 am.  You do not need to be fasting for this test.  Please come back for a follow-up appointment in 1 month.      Bariatric Surgery You have so much to gain by losing weight.  You may have already tried every diet and exercise plan imaginable.  And, you may have sought advice from your family physician, too.   Sometimes, in spite of such diligent efforts, you may not be able to achieve long-term results by yourself.  In cases of severe  obesity, bariatric or weight loss surgery is a proven method of achieving long-term weight control.  Our Services Our bariatric surgery programs offer our patients new hope and long-term weight-loss solution.  Since introducing our services in 2003, we have conducted more than 2,400 successful procedures.  Our program is designated as a Investment banker, corporateComprehensive Center by the Metabolic and Bariatric Surgery Accreditation and Quality Improvement Program (MBSAQIP), a Child psychotherapistnational accrediting body that sets rigorous patient safety and outcome standards.  Our program is also designated as a Engineer, manufacturing systemsCenter of Excellence by [employer] insurance companies.   Our exceptional weight-loss surgery team specializes in diagnosis, treatment, follow-up care, and ongoing support for our patients with severe weight loss challenges.  We currently offer laparoscopic sleeve gastrectomy, gastric bypass, and adjustable gastric band (LAP-BAND).    Attend our Bariatrics Seminar Choosing to undergo a bariatric procedure is a big decision, and one that should not be taken lightly.  You now have two options in how you learn about weight-loss surgery - in person or online.  Our objective is to ensure you have all of the information that you need to evaluate the advantages and obligations of this life changing procedure.  Please note that you are not alone in this process, and our experienced team is ready to assist and  answer all of your questions.  There are several ways to register for a seminar (either on-line or in person): 1)  Call [phone] 2) Go on-line to [facility] and register for either type of seminar.  [email or web address]

## 2015-12-22 ENCOUNTER — Other Ambulatory Visit: Payer: Self-pay | Admitting: Family Medicine

## 2015-12-22 DIAGNOSIS — Z794 Long term (current) use of insulin: Principal | ICD-10-CM

## 2015-12-22 DIAGNOSIS — IMO0001 Reserved for inherently not codable concepts without codable children: Secondary | ICD-10-CM

## 2015-12-22 DIAGNOSIS — E1165 Type 2 diabetes mellitus with hyperglycemia: Principal | ICD-10-CM

## 2015-12-22 MED FILL — ?HYDROCHLOROTHIAZIDE 12.5 M: 12.5 | 30 days supply | Qty: 30 | Fill #3

## 2015-12-22 MED FILL — ?METFORMIN HCL 1,000 MG TAB: 1000 | 30 days supply | Qty: 30 | Fill #0

## 2015-12-22 MED FILL — BYETTA 5 MCG DOSE PEN INJ: 5 | 30 days supply | Qty: 1 | Fill #0

## 2015-12-22 MED FILL — **JANUMET XR 100-1,000 MG T: 100-1000 | 14 days supply | Qty: 14 | Fill #6

## 2015-12-24 ENCOUNTER — Other Ambulatory Visit (INDEPENDENT_AMBULATORY_CARE_PROVIDER_SITE_OTHER): Payer: 59

## 2015-12-24 DIAGNOSIS — E1165 Type 2 diabetes mellitus with hyperglycemia: Secondary | ICD-10-CM | POA: Diagnosis not present

## 2015-12-24 DIAGNOSIS — IMO0001 Reserved for inherently not codable concepts without codable children: Secondary | ICD-10-CM

## 2015-12-24 DIAGNOSIS — Z794 Long term (current) use of insulin: Secondary | ICD-10-CM | POA: Diagnosis not present

## 2015-12-24 LAB — CORTISOL: CORTISOL PLASMA: 4.3 ug/dL

## 2016-01-14 ENCOUNTER — Encounter: Payer: 59 | Admitting: Obstetrics and Gynecology

## 2016-01-15 ENCOUNTER — Ambulatory Visit: Payer: 59 | Admitting: Endocrinology

## 2016-01-15 DIAGNOSIS — Z0289 Encounter for other administrative examinations: Secondary | ICD-10-CM

## 2016-01-30 ENCOUNTER — Other Ambulatory Visit: Payer: Self-pay | Admitting: Family Medicine

## 2016-01-30 DIAGNOSIS — IMO0001 Reserved for inherently not codable concepts without codable children: Secondary | ICD-10-CM

## 2016-01-30 DIAGNOSIS — Z794 Long term (current) use of insulin: Principal | ICD-10-CM

## 2016-01-30 DIAGNOSIS — E1165 Type 2 diabetes mellitus with hyperglycemia: Principal | ICD-10-CM

## 2016-02-04 MED FILL — BYETTA 5 MCG DOSE PEN INJ: 5 | 30 days supply | Qty: 1 | Fill #0

## 2016-02-04 MED FILL — ?METFORMIN HCL 1,000 MG TAB: 1000 | 30 days supply | Qty: 30 | Fill #0

## 2016-02-11 ENCOUNTER — Other Ambulatory Visit: Payer: Self-pay | Admitting: *Deleted

## 2016-02-11 ENCOUNTER — Ambulatory Visit: Payer: Self-pay | Admitting: Obstetrics and Gynecology

## 2016-02-11 MED ORDER — SITAGLIPTIN PHOS-METFORMIN HCL 50-1000 MG PO TABS: 1.0000 | ORAL_TABLET | Freq: Two times a day (BID) | ORAL | 3 refills | Status: DC

## 2016-02-11 MED ORDER — SITAGLIP PHOS-METFORMIN HCL ER 50-1000 MG PO TB24: 1.0000 | ORAL_TABLET | Freq: Every day | ORAL | 3 refills | Status: DC

## 2016-02-11 MED FILL — !NOVOLOG 100UNITS/ML VIAL: 100/ML | 33 days supply | Qty: 60 | Fill #1

## 2016-02-11 MED FILL — **JANUMET XR 50-1,000 MG TA: 50-1000 | 30 days supply | Qty: 30 | Fill #0

## 2016-02-11 MED FILL — ?HYDROCHLOROTHIAZIDE 12.5 M: 12.5 | 30 days supply | Qty: 30 | Fill #4

## 2016-02-11 NOTE — Telephone Encounter (Signed)
Ordered until PASS program supply comes in per Dr. Hyman HopesJEGEDE

## 2016-02-12 ENCOUNTER — Other Ambulatory Visit: Payer: Self-pay

## 2016-02-12 DIAGNOSIS — Z794 Long term (current) use of insulin: Principal | ICD-10-CM

## 2016-02-12 DIAGNOSIS — E1165 Type 2 diabetes mellitus with hyperglycemia: Principal | ICD-10-CM

## 2016-02-12 DIAGNOSIS — IMO0001 Reserved for inherently not codable concepts without codable children: Secondary | ICD-10-CM

## 2016-02-12 MED ORDER — INSULIN ASPART 100 UNIT/ML FLEXPEN: 60.0000 [IU] | PEN_INJECTOR | Freq: Three times a day (TID) | SUBCUTANEOUS | 3 refills | Status: DC

## 2016-02-12 MED ORDER — SITAGLIP PHOS-METFORMIN HCL ER 100-1000 MG PO TB24: 1.0000 | ORAL_TABLET | Freq: Every day | ORAL | 3 refills | Status: DC

## 2016-02-12 MED ORDER — EXENATIDE 5 MCG/0.02ML ~~LOC~~ SOPN: PEN_INJECTOR | SUBCUTANEOUS | 3 refills | Status: DC

## 2016-03-09 ENCOUNTER — Telehealth: Payer: Self-pay | Admitting: Obstetrics and Gynecology

## 2016-03-09 NOTE — Telephone Encounter (Signed)
Called and spoke with patient to schedule a new patient doctor referral.  The patient requests the referral be deferred until April 08, 2016.  She said, "I am in between insurances right now.  I should have new insurance by next month if you'll just call me then.    Referral deferred until 04/08/13 per patient request.  Note to referring provider.

## 2016-03-11 ENCOUNTER — Ambulatory Visit: Payer: Self-pay | Admitting: Obstetrics and Gynecology

## 2016-04-08 MED FILL — ?HYDROCHLOROTHIAZIDE 12.5MG: 12.5 | 30 days supply | Qty: 30 | Fill #5

## 2016-04-08 MED FILL — !NOVOLOG 100UNITS/ML VIAL: 100/ML | 16 days supply | Qty: 30 | Fill #2

## 2016-04-14 ENCOUNTER — Telehealth: Payer: Self-pay | Admitting: Obstetrics and Gynecology

## 2016-04-14 NOTE — Telephone Encounter (Signed)
Called and left a message for patient to call back to schedule a new patient doctor referral. °

## 2016-06-17 ENCOUNTER — Ambulatory Visit (INDEPENDENT_AMBULATORY_CARE_PROVIDER_SITE_OTHER): Payer: Managed Care, Other (non HMO) | Admitting: Obstetrics and Gynecology

## 2016-06-17 ENCOUNTER — Encounter: Payer: Self-pay | Admitting: Obstetrics and Gynecology

## 2016-06-17 VITALS — BP 124/80 | HR 80 | Resp 16 | Ht 63.0 in | Wt 234.0 lb

## 2016-06-17 DIAGNOSIS — Z6841 Body Mass Index (BMI) 40.0 and over, adult: Secondary | ICD-10-CM | POA: Diagnosis not present

## 2016-06-17 DIAGNOSIS — Z01419 Encounter for gynecological examination (general) (routine) without abnormal findings: Secondary | ICD-10-CM | POA: Diagnosis not present

## 2016-06-17 DIAGNOSIS — Z0000 Encounter for general adult medical examination without abnormal findings: Secondary | ICD-10-CM | POA: Diagnosis not present

## 2016-06-17 DIAGNOSIS — E282 Polycystic ovarian syndrome: Secondary | ICD-10-CM

## 2016-06-17 DIAGNOSIS — L659 Nonscarring hair loss, unspecified: Secondary | ICD-10-CM | POA: Diagnosis not present

## 2016-06-17 DIAGNOSIS — N898 Other specified noninflammatory disorders of vagina: Secondary | ICD-10-CM | POA: Diagnosis not present

## 2016-06-17 DIAGNOSIS — N912 Amenorrhea, unspecified: Secondary | ICD-10-CM | POA: Diagnosis not present

## 2016-06-17 DIAGNOSIS — Z124 Encounter for screening for malignant neoplasm of cervix: Secondary | ICD-10-CM | POA: Diagnosis not present

## 2016-06-17 DIAGNOSIS — Z8639 Personal history of other endocrine, nutritional and metabolic disease: Secondary | ICD-10-CM

## 2016-06-17 DIAGNOSIS — L680 Hirsutism: Secondary | ICD-10-CM

## 2016-06-17 DIAGNOSIS — Z23 Encounter for immunization: Secondary | ICD-10-CM

## 2016-06-17 DIAGNOSIS — N941 Unspecified dyspareunia: Secondary | ICD-10-CM | POA: Diagnosis not present

## 2016-06-17 LAB — COMPREHENSIVE METABOLIC PANEL
ALT: 41 U/L — ABNORMAL HIGH (ref 6–29)
AST: 40 U/L — ABNORMAL HIGH (ref 10–30)
Albumin: 4.1 g/dL (ref 3.6–5.1)
Alkaline Phosphatase: 87 U/L (ref 33–115)
BUN: 14 mg/dL (ref 7–25)
CHLORIDE: 100 mmol/L (ref 98–110)
CO2: 22 mmol/L (ref 20–31)
CREATININE: 1.02 mg/dL (ref 0.50–1.10)
Calcium: 9.6 mg/dL (ref 8.6–10.2)
Glucose, Bld: 288 mg/dL — ABNORMAL HIGH (ref 65–99)
Potassium: 4.2 mmol/L (ref 3.5–5.3)
SODIUM: 137 mmol/L (ref 135–146)
TOTAL PROTEIN: 7.6 g/dL (ref 6.1–8.1)
Total Bilirubin: 0.3 mg/dL (ref 0.2–1.2)

## 2016-06-17 LAB — CBC
HCT: 43.7 % (ref 35.0–45.0)
Hemoglobin: 13.9 g/dL (ref 11.7–15.5)
MCH: 28.8 pg (ref 27.0–33.0)
MCHC: 31.8 g/dL — ABNORMAL LOW (ref 32.0–36.0)
MCV: 90.7 fL (ref 80.0–100.0)
MPV: 11.2 fL (ref 7.5–12.5)
PLATELETS: 254 10*3/uL (ref 140–400)
RBC: 4.82 MIL/uL (ref 3.80–5.10)
RDW: 14.5 % (ref 11.0–15.0)
WBC: 5.7 10*3/uL (ref 3.8–10.8)

## 2016-06-17 LAB — LIPID PANEL
Cholesterol: 142 mg/dL (ref <200)
HDL: 32 mg/dL — ABNORMAL LOW (ref >50)
Total CHOL/HDL Ratio: 4.4 Ratio (ref <5.0)
Triglycerides: 474 mg/dL — ABNORMAL HIGH (ref <150)

## 2016-06-17 LAB — TSH: TSH: 2.01 m[IU]/L

## 2016-06-17 MED ORDER — MEDROXYPROGESTERONE ACETATE 10 MG PO TABS: 10.0000 mg | ORAL_TABLET | Freq: Every day | ORAL | 0 refills | Status: DC

## 2016-06-17 MED FILL — MEDROXYPROGESTERONE 10 MG T: 10 | 10 days supply | Qty: 10 | Fill #0

## 2016-06-17 NOTE — Progress Notes (Signed)
36 y.o. G1P0010 MarriedAfrican AmericanF here for annual exam.   Menarche age 59, she didn't have another cycle year and then 2 years. She then was on provera and then OCP's. She went off OCP's at around age 25. After that she would have a few cycles a year, for about 2 years she was having cycles every 28 days, got pregnant in 2015 and had a SAB. No cycle since 2016. During the time she was having cycles her weight down (about the size she is now).  When she does have a cycle she typically only bleeds for 1-3 days, light flow.  H/O PCOS, c/o hirsutism under her chin and on her ears. No change, prior elevated testosterone level c/w PCOS. Hair has been thinning.  No galactorrhea. She has been losing weight, she is down 12 lbs in 3 months. She would like to get pregnant.  She is always hot, possible hot flashes, some night sweats.  Sexually active, some dryness, feels tighter. She can have bleeding with intercourse from tearing. Has intercourse about 1 x a week. The discomfort and tearing has been happening in the last few months.  She has diabetes, last HgbA1C was in 4/17 and was 11. She states she has a h/o HTN, recently better. She only takes the HCTZ as needed for swelling. On review of old labs she had elevated urine microalbumin, c/w early diabetic nephropathy, diabetic kidney disease (an ace inhibitor was recommended, she isn't on one). She was without insurance and hasn't seen her primary in a year. She is making an appointment.     Patient's last menstrual period was 08/09/2014.          Sexually active: Yes.    The current method of family planning is none.    Exercising: Yes.    cardio Smoker:  Former smoker  Health Maintenance: Pap:  05-10-13 WNL NEG HR HPV  History of abnormal Pap:  no MMG:  Never Colonoscopy:  Never TDaP:  Unsure  Gardasil: N/A   reports that she quit smoking about 4 years ago. Her smoking use included Cigarettes. She has a 2.75 pack-year smoking history. She  has never used smokeless tobacco. She reports that she drinks alcohol. She reports that she does not use drugs.Rare ETOH use. Works in a call center.   Past Medical History:  Diagnosis Date  . Amenorrhea   . Anxiety   . Chronic sinusitis   . Complication of anesthesia    anesthesia not strong enough, fought during surgery  . Depression 2011   treated at the time, no longer on meds  . Diabetes mellitus without complication (HCC)    diet controlled  . Headache(784.0)    migraines (rare now)  . Hypertension   . Infertility, female   . Obesity   . PCOS (polycystic ovarian syndrome)   . Shortness of breath    going upstairs, being worked up for sarcoidoisi  No auras, migraines are rare.   Past Surgical History:  Procedure Laterality Date  . NASAL SINUS SURGERY N/A 09/15/2012   Procedure: BILATERAL ENDOSCOPIC MAXILLARY ANTROSTOMY/ETHMOIDECTOMY/FRONTAL SINUS SURGERY and sphenoidotomy;  Surgeon: Izora Gala, MD;  Location: [facility];  Service: ENT;  Laterality: N/A;  . VIDEO BRONCHOSCOPY Bilateral 05/15/2012   pt styated not completed    Current Outpatient Prescriptions  Medication Sig Dispense Refill  . Blood Glucose Monitoring Suppl (ONE TOUCH ULTRA SYSTEM KIT) W/DEVICE KIT 1 kit by Does not apply route once. 1 each 0  . glucose blood  test strip 1 each by Other route 3 (three) times daily. Use as instructed 100 each 12  . hydrochlorothiazide (HYDRODIURIL) 12.5 MG tablet Take 1 tablet (12.5 mg total) by mouth daily. 30 tablet 5  . Lancets (ONETOUCH ULTRASOFT) lancets 1 each by Other route 3 (three) times daily. Use as instructed 100 each 12  . metFORMIN (GLUCOPHAGE) 1000 MG tablet TAKE 1 TABLET BY MOUTH DAILY AFTER SUPPER. 30 tablet 0  . NOVOLOG 100 UNIT/ML injection   11  . SitaGLIPtin-MetFORMIN HCl (JANUMET XR) [phone] MG TB24 Take 1 tablet by mouth daily. 90 tablet 3   No current facility-administered medications for this visit.     Family History  Problem Relation Age of Onset  .  Hypertension Mother   . Sarcoidosis Brother   . Hypertension Brother   . Uterine cancer Paternal Aunt   . Brain cancer Paternal Grandmother   . Diabetes Paternal Grandmother     Review of Systems  Constitutional: Negative.   HENT: Negative.   Eyes: Negative.   Respiratory: Negative.   Cardiovascular: Negative.   Gastrointestinal: Negative.   Endocrine: Negative.   Genitourinary:       Amenorrhea   Musculoskeletal: Negative.   Skin: Negative.   Allergic/Immunologic: Negative.   Neurological: Negative.   Psychiatric/Behavioral: Negative.     Exam:   BP 124/80 (BP Location: Right Arm, Patient Position: Sitting, Cuff Size: Normal)   Pulse 80   Resp 16   Ht '5\' 3"'$  (1.6 m)   Wt 234 lb (106.1 kg)   LMP 08/09/2014   BMI 41.45 kg/m   Weight change: '@WEIGHTCHANGE'$ @ Height:   Height: '5\' 3"'$  (160 cm)  Ht Readings from Last 3 Encounters:  06/17/16 '5\' 3"'$  (1.6 m)  12/17/15 '5\' 3"'$  (1.6 m)  05/29/15 '5\' 3"'$  (1.6 m)    General appearance: alert, cooperative and appears stated age Head: Normocephalic, without obvious abnormality, atraumatic Neck: no adenopathy, supple, symmetrical, trachea midline and thyroid normal to inspection and palpation Lungs: clear to auscultation bilaterally Cardiovascular: regular rate and rhythm Breasts: normal appearance, no masses or tenderness Abdomen: soft, non-tender; bowel sounds normal; no masses,  no organomegaly Extremities: extremities normal, atraumatic, no cyanosis or edema Skin: Skin color, texture, turgor normal. No rashes or lesions Lymph nodes: Cervical, supraclavicular, and axillary nodes normal. No abnormal inguinal nodes palpated Neurologic: Grossly normal   Pelvic: External genitalia:  no lesions              Urethra:  normal appearing urethra with no masses, tenderness or lesions              Bartholins and Skenes: normal                 Vagina: normal appearing vagina with normal color a slight increase in watery, white vaginal d/c. No  signs of atrophy              Cervix: no lesions               Bimanual Exam:  Uterus:  normal size, contour, position, consistency, mobility, non-tender              Adnexa: no mass, fullness, tenderness               Rectovaginal: Confirms               Anus:  normal sphincter tone, no lesions  Chaperone was present for exam.  A:  Well Woman with normal exam  Amenorrhea  Dyspareunia  Vaginal dryness  Hair thinning  Diabetes  Overweight, working on weight loss  Vaginal d/c   P:   Pap with hpv  FSH, TSH, estradiol, AMH, BhcG, CMP, CBC, fasting lipid panel, HgbA1C  If her BhcG is negative, will do a provera w/d (patient aware needs two week without sex or with protection, then check a upt, if negative take provera). She will then call with or without a cycle  Discussed breast self exam  Discussed calcium and vit D intake  She is on PNV  After evaluation and control of diabetes will refer to Infertility specialist.   She will f/u with her primary  Affirm   Further plans depending on results

## 2016-06-17 NOTE — Patient Instructions (Signed)
EXERCISE AND DIET:  We recommended that you start or continue a regular exercise program for good health. Regular exercise means any activity that makes your heart beat faster and makes you sweat.  We recommend exercising at least 30 minutes per day at least 3 days a week, preferably 4 or 5.  We also recommend a diet low in fat and sugar.  Inactivity, poor dietary choices and obesity can cause diabetes, heart attack, stroke, and kidney damage, among others.    ALCOHOL AND SMOKING:  Women should limit their alcohol intake to no more than 7 drinks/beers/glasses of wine (combined, not each!) per week. Moderation of alcohol intake to this level decreases your risk of breast cancer and liver damage. And of course, no recreational drugs are part of a healthy lifestyle.  And absolutely no smoking or even second hand smoke. Most people know smoking can cause heart and lung diseases, but did you know it also contributes to weakening of your bones? Aging of your skin?  Yellowing of your teeth and nails?  CALCIUM AND VITAMIN D:  Adequate intake of calcium and Vitamin D are recommended.  The recommendations for exact amounts of these supplements seem to change often, but generally speaking 600 mg of calcium (either carbonate or citrate) and 800 units of Vitamin D per day seems prudent. Certain women may benefit from higher intake of Vitamin D.  If you are among these women, your doctor will have told you during your visit.    PAP SMEARS:  Pap smears, to check for cervical cancer or precancers,  have traditionally been done yearly, although recent scientific advances have shown that most women can have pap smears less often.  However, every woman still should have a physical exam from her gynecologist every year. It will include a breast check, inspection of the vulva and vagina to check for abnormal growths or skin changes, a visual exam of the cervix, and then an exam to evaluate the size and shape of the uterus and  ovaries.  And after 34 years of age, a rectal exam is indicated to check for rectal cancers. We will also provide age appropriate advice regarding health maintenance, like when you should have certain vaccines, screening for sexually transmitted diseases, bone density testing, colonoscopy, mammograms, etc.   MAMMOGRAMS:  All women over 34 years old should have a yearly mammogram. Many facilities now offer a "3D" mammogram, which may cost around $50 extra out of pocket. If possible,  we recommend you accept the option to have the 3D mammogram performed.  It both reduces the number of women who will be called back for extra views which then turn out to be normal, and it is better than the routine mammogram at detecting truly abnormal areas.    COLONOSCOPY:  Colonoscopy to screen for colon cancer is recommended for all women at age 34.  We know, you hate the idea of the prep.  We agree, BUT, having colon cancer and not knowing it is worse!!  Colon cancer so often starts as a polyp that can be seen and removed at colonscopy, which can quite literally save your life!  And if your first colonoscopy is normal and you have no family history of colon cancer, most women don't have to have it again for 10 years.  Once every ten years, you can do something that may end up saving your life, right?  We will be happy to help you get it scheduled when you are ready.    Be sure to check your insurance coverage so you understand how much it will cost.  It may be covered as a preventative service at no cost, but you should check your particular policy.      Secondary Amenorrhea Secondary amenorrhea is the stopping of menstrual flow for 3-6 months in a female who has previously had periods. There are many possible causes. Most of these causes are not serious. Usually, treating the underlying problem causing the loss of menses will return your periods to normal. What are the causes? Some common and uncommon causes of not  menstruating include:  Malnutrition.  Low blood sugar (hypoglycemia).  Polycystic ovary disease.  Stress or fear.  Breastfeeding.  Hormone imbalance.  Ovarian failure.  Medicines.  Extreme obesity.  Cystic fibrosis.  Low body weight or drastic weight reduction from any cause.  Early menopause.  Removal of ovaries or uterus.  Contraceptives.  Illness.  Long-term (chronic) illnesses.  Cushing syndrome.  Thyroid problems.  Birth control pills, patches, or vaginal rings for birth control. What increases the risk? You may be at greater risk of secondary amenorrhea if:  You have a family history of this condition.  You have an eating disorder.  You do athletic training. How is this diagnosed? A diagnosis is made by your health care provider taking a medical history and doing a physical exam. This will include a pelvic exam to check for problems with your reproductive organs. Pregnancy must be ruled out. Often, numerous blood tests are done to measure different hormones in the body. Urine testing may be done. Specialized exams (ultrasound, CT scan, MRI, or hysteroscopy) may have to be done as well as measuring the body mass index (BMI). How is this treated? Treatment depends on the cause of the amenorrhea. If an eating disorder is present, this can be treated with an adequate diet and therapy. Chronic illnesses may improve with treatment of the illness. Amenorrhea may be corrected with medicines, lifestyle changes, or surgery. If the amenorrhea cannot be corrected, it is sometimes possible to create a false menstruation with medicines. Follow these instructions at home:  Maintain a healthy diet.  Manage weight problems.  Exercise regularly but not excessively.  Get adequate sleep.  Manage stress.  Be aware of changes in your menstrual cycle. Keep a record of when your periods occur. Note the date your period starts, how long it lasts, and any problems. Contact  a health care provider if: Your symptoms do not get better with treatment. This information is not intended to replace advice given to you by your health care provider. Make sure you discuss any questions you have with your health care provider. Document Released: 03/08/2006 Document Revised: 07/03/2015 Document Reviewed: 07/13/2012 Elsevier Interactive Patient Education  2017 [employer] Inc[employer]  Polycystic Ovarian Syndrome Polycystic ovarian syndrome (PCOS) is a common hormonal disorder among women of reproductive age. In most women with PCOS, many small fluid-filled sacs (cysts) grow on the ovaries, and the cysts are not part of a normal menstrual cycle. PCOS can cause problems with your menstrual periods and make it difficult to get pregnant. It can also cause an increased risk of miscarriage with pregnancy. If it is not treated, PCOS can lead to serious health problems, such as diabetes and heart disease. What are the causes? The cause of PCOS is not known, but it may be the result of a combination of certain factors, such as:  Irregular menstrual cycle.  High levels of certain hormones (androgens).  Problems  with the hormone that helps to control blood sugar (insulin resistance).  Certain genes. What increases the risk? This condition is more likely to develop in women who have a family history of PCOS. What are the signs or symptoms? Symptoms of PCOS may include:  Multiple ovarian cysts.  Infrequent periods or no periods.  Periods that are too frequent or too heavy.  Unpredictable periods.  Inability to get pregnant (infertility) because of not ovulating.  Increased growth of hair on the face, chest, stomach, back, thumbs, thighs, or toes.  Acne or oily skin. Acne may develop during adulthood, and it may not respond to treatment.  Pelvic pain.  Weight gain or obesity.  Patches of thickened and dark brown or black skin on the neck, arms, breasts, or thighs (acanthosis  nigricans).  Excess hair growth on the face, chest, abdomen, or upper thighs (hirsutism). How is this diagnosed? This condition is diagnosed based on:  Your medical history.  A physical exam, including a pelvic exam. Your health care provider may look for areas of increased hair growth on your skin.  Tests, such as:  Ultrasound. This may be used to examine the ovaries and the lining of the uterus (endometrium) for cysts.  Blood tests. These may be used to check levels of sugar (glucose), female hormone (testosterone), and female hormones (estrogen and progesterone) in your blood. How is this treated? There is no cure for PCOS, but treatment can help to manage symptoms and prevent more health problems from developing. Treatment varies depending on:  Your symptoms.  Whether you want to have a baby or whether you need birth control (contraception). Treatment may include nutrition and lifestyle changes along with:  Progesterone hormone to start a menstrual period.  Birth control pills to help you have regular menstrual periods.  Medicines to make you ovulate, if you want to get pregnant.  Medicine to reduce excessive hair growth.  Surgery, in severe cases. This may involve making small holes in one or both of your ovaries. This decreases the amount of testosterone that your body produces. Follow these instructions at home:  Take over-the-counter and prescription medicines only as told by your health care provider.  Follow a healthy meal plan. This can help you reduce the effects of PCOS.  Eat a healthy diet that includes lean proteins, complex carbohydrates, fresh fruits and vegetables, low-fat dairy products, and healthy fats. Make sure to eat enough fiber.  If you are overweight, lose weight as told by your health care provider.  Losing 10% of your body weight may improve symptoms.  Your health care provider can determine how much weight loss is best for you and can help you  lose weight safely.  Keep all follow-up visits as told by your health care provider. This is important. Contact a health care provider if:  Your symptoms do not get better with medicine.  You develop new symptoms. This information is not intended to replace advice given to you by your health care provider. Make sure you discuss any questions you have with your health care provider. Document Released: 05/21/2004 Document Revised: 09/23/2015 Document Reviewed: 07/13/2015 Elsevier Interactive Patient Education  2017 [employer]

## 2016-06-18 LAB — HCG, QUANTITATIVE, PREGNANCY: hCG, Beta Chain, Quant, S: <2 m[IU]/mL

## 2016-06-18 LAB — HEMOGLOBIN A1C
HEMOGLOBIN A1C: 11.9 % — AB (ref <5.7)
Mean Plasma Glucose: 295 mg/dL

## 2016-06-18 LAB — WET PREP BY MOLECULAR PROBE
CANDIDA SPECIES: NOT DETECTED
Gardnerella vaginalis: NOT DETECTED
TRICHOMONAS VAG: NOT DETECTED

## 2016-06-18 LAB — ESTRADIOL: ESTRADIOL: 48 pg/mL

## 2016-06-18 LAB — PROLACTIN: PROLACTIN: 4.6 ng/mL

## 2016-06-18 LAB — FOLLICLE STIMULATING HORMONE: FSH: 5.5 m[IU]/mL

## 2016-06-18 LAB — IPS PAP TEST WITH HPV

## 2016-06-21 ENCOUNTER — Telehealth: Payer: Self-pay | Admitting: Obstetrics and Gynecology

## 2016-06-21 LAB — ANTI MULLERIAN HORMONE: AMH ASSESSR: 1.59 ng/mL

## 2016-06-21 NOTE — Telephone Encounter (Signed)
This is Dr. Salli QuarryJertson's pt and was forwarded to her.

## 2016-06-21 NOTE — Telephone Encounter (Signed)
-----   Message from Romualdo BolkJill Evelyn Jertson, MD sent at 06/21/2016  1:03 PM EDT ----- Please let the patient know that her HgbA1C was still very elevated. Her liver function tests were also mildly elevated.  Her FSH and estradiol were in a normal pre-menopausal range. Awaiting her AMH (predictor of fertility). She should take the provera as previously discussed and call with or without a cycle. It's very important that I know if she bleeds. If she doesn't bleed, she will need further evaluation.  Her triglycerides are very high and her good cholesterol is low. She needs to f/u with her primary for management of her diabetes, abnormal LFT's and lipids. It is very important that she gets her diabetes under control prior to getting pregnant. I would recommend she use condoms, she could have  spontaneous ovulation and a pregnancy with her elevated HgbA1C could be dangerous for the fetus.  Pap was negative with negative hpv, wet prep probe was negative.  08 recall. Please confirm that we have the correct primary listed and send a copy of her labs.  Please call the patient in one month to check on her w/d from provera if you don't hear from her.

## 2016-06-21 NOTE — Telephone Encounter (Signed)
Patient is calling for her recent results. °

## 2016-06-21 NOTE — Telephone Encounter (Signed)
Left message for patient to call regarding lab results -eh  

## 2016-06-21 NOTE — Telephone Encounter (Signed)
Spoke with patient, advised of results and recommendations as seen below per Dr. Oscar LaJertson. Patient states pcp is [facility]Funches [facility]. Patient states she has appointment on 06/25/16. Per review of EPIC, labs available for review by Dr. Armen PickupFunches. Patient verbalizes understanding and is agreeable.   Call to Dr. Armen PickupFunches at [phone], spoke with Mikle Boswortharlos. Was advised patient will be seeing Dr. Laural BenesJohnson as Dr. Armen PickupFunches will be leaving practice. Advised patient needs to f/u with abnormal labs dated 06/17/16 -lipids, LFT and HgbA1c.

## 2016-06-21 NOTE — Telephone Encounter (Signed)
Routing to Ria CommentPatricia Grubb, FNP for review and advise of labs as Leota SauersDeborah Leonard CNM is out of the office today.

## 2016-06-23 ENCOUNTER — Encounter: Payer: Self-pay | Admitting: Family Medicine

## 2016-06-25 ENCOUNTER — Ambulatory Visit: Payer: 59 | Attending: Internal Medicine | Admitting: Internal Medicine

## 2016-06-25 ENCOUNTER — Encounter: Payer: Self-pay | Admitting: Internal Medicine

## 2016-06-25 VITALS — BP 143/88 | HR 86 | Temp 98.2°F | Resp 18 | Ht 63.0 in | Wt 238.0 lb

## 2016-06-25 DIAGNOSIS — Z794 Long term (current) use of insulin: Secondary | ICD-10-CM | POA: Diagnosis not present

## 2016-06-25 DIAGNOSIS — R945 Abnormal results of liver function studies: Secondary | ICD-10-CM

## 2016-06-25 DIAGNOSIS — I10 Essential (primary) hypertension: Secondary | ICD-10-CM | POA: Diagnosis not present

## 2016-06-25 DIAGNOSIS — R7989 Other specified abnormal findings of blood chemistry: Secondary | ICD-10-CM | POA: Diagnosis not present

## 2016-06-25 DIAGNOSIS — F329 Major depressive disorder, single episode, unspecified: Secondary | ICD-10-CM

## 2016-06-25 DIAGNOSIS — F419 Anxiety disorder, unspecified: Secondary | ICD-10-CM | POA: Insufficient documentation

## 2016-06-25 DIAGNOSIS — Z2821 Immunization not carried out because of patient refusal: Secondary | ICD-10-CM

## 2016-06-25 DIAGNOSIS — E781 Pure hyperglyceridemia: Secondary | ICD-10-CM | POA: Insufficient documentation

## 2016-06-25 DIAGNOSIS — F32A Depression, unspecified: Secondary | ICD-10-CM

## 2016-06-25 DIAGNOSIS — IMO0001 Reserved for inherently not codable concepts without codable children: Secondary | ICD-10-CM

## 2016-06-25 DIAGNOSIS — E1165 Type 2 diabetes mellitus with hyperglycemia: Secondary | ICD-10-CM

## 2016-06-25 LAB — GLUCOSE, POCT (MANUAL RESULT ENTRY): POC Glucose: 217 mg/dl — AB (ref 70–99)

## 2016-06-25 MED ORDER — SITAGLIP PHOS-METFORMIN HCL ER 100-1000 MG PO TB24: 1.0000 | ORAL_TABLET | Freq: Every day | ORAL | 3 refills | Status: DC

## 2016-06-25 MED ORDER — GEMFIBROZIL 600 MG PO TABS: 600.0000 mg | ORAL_TABLET | Freq: Two times a day (BID) | ORAL | 3 refills | Status: DC

## 2016-06-25 MED ORDER — EXENATIDE 5 MCG/0.02ML ~~LOC~~ SOPN: 5.0000 ug | PEN_INJECTOR | Freq: Two times a day (BID) | SUBCUTANEOUS | 6 refills | Status: DC

## 2016-06-25 MED ORDER — NOVOLOG 100 UNIT/ML ~~LOC~~ SOLN: 60.0000 [IU] | Freq: Two times a day (BID) | SUBCUTANEOUS | 11 refills | Status: DC

## 2016-06-25 MED ORDER — HYDROCHLOROTHIAZIDE 12.5 MG PO TABS: 12.5000 mg | ORAL_TABLET | Freq: Every day | ORAL | 5 refills | Status: DC

## 2016-06-25 MED FILL — HYDROCHLOROTHIAZIDE 12.5 MG: 12.5 | 30 days supply | Qty: 30 | Fill #0

## 2016-06-25 MED FILL — GEMFIBROZIL 600 MG TABLET: 600 | 30 days supply | Qty: 60 | Fill #0

## 2016-06-25 NOTE — Patient Instructions (Signed)
Front desk instructions: Given a follow-up appointment with the patient educator Dr. Lewie ChamberHammer in one month.  Diabetes: Stop metformin Change NovoLog to 60 units twice a day with meals Continue healthy eating and regular exercise as discussed.  High cholesterol: Start Lopid  Hypertension,:  Try to limit salt in the foods as discussed. Blood pressure will be rechecked when you meet with the patient educator in 1 month.

## 2016-06-25 NOTE — Progress Notes (Addendum)
Patient ID: Tanya Hardy, female    DOB: 1980-05-27  MRN: [id number]  CC:  Go over lab results  Subjective:  Tanya Hardy is a 36 y.o. female who presents for chronic ds management. Previous PCP was Dr. Adrian Blackwater who will be leaving in August. Her concerns today include:  Recently saw GYN and had blood tests including A1C, lipid and chem.  Told to discuss results with PCP.   DIABETES TYPE 2 Last A1C:   Results for orders placed or performed in visit on 06/25/16  POCT glucose (manual entry)  Result Value Ref Range   POC Glucose 217 (A) 70 - 99 mg/dl    Med Adherence:  _0  Yes    _1  No Out of Novolog 1.5 mths and Byetta x 4 mths.  Started new job and had difficulty getting here to pharmacy to pick meds. Takes Metformin occasionally  due to significant GI  Symptoms - cramps and diarrhea. Not bothered by the Janumet.  "5 shots a day is a bit much for me." Medication side effects:  _2  Yes    _3  No Home Monitoring?  _4  Yes    _5  Not regularly Home glucose results range: Diet Adherence: _6  Yes.  "I am on a new plan.  I have drastically cut back on my eating starting about 2 wks ago."    _7  No  Exercise: _8  Yes    _9  No Hypoglycemic episodes?: _10  Yes    _11  No Numbness of the feet? _12  Yes    _13  No Retinopathy hx? _14  Yes    _15  No Last eye exam: due for one.  She plans to schedule herself.  Comments: just started working out - cardio 3 x a wk for 30 mins and swimming 3 times a wk also  2. Elevated LFTs:  Mild elevation in AST and ALT.  Does not drink  3.  Hyperlipidemia:  Was suppose to be on Lopid but pt states she never received it.  4.  HTN -compliant with HCTZ.  On this instead of Lisinopril because of LE edema which she gets daily if she does not take it. -tries to limit salt.  Ate  [place] food last night that was salted more than she would like.  -no device to check BP  5.  Struggled with depression and anxiety entire life.  Depression better now that she is exercising  more. -has a lot of anxiety about her new job.  "it is a high performance job."    Patient Active Problem List   Diagnosis Date Noted  . Amenorrhea 12/17/2015  . Severe obesity (BMI >= 40) ([place]) 05/29/2015  . Macroalbuminuric diabetic nephropathy ([place]) 10/01/2014  . Secondary amenorrhea 09/30/2014  . PCOS (polycystic ovarian syndrome) 09/30/2014  . Diabetes type 2, uncontrolled ([place]) 07/04/2013  . Essential hypertension, benign 07/04/2013  . Hypertriglyceridemia 07/04/2013  . Sarcoidosis 10/06/2012  . Pulmonary infiltrates 09/15/2012  . Chronic rhinitis 04/20/2012  . Lymphadenopathy, mediastinal 03/27/2012     Current Outpatient Prescriptions on File Prior to Visit  Medication Sig Dispense Refill  . Blood Glucose Monitoring Suppl (ONE TOUCH ULTRA SYSTEM KIT) W/DEVICE KIT 1 kit by Does not apply route once. 1 each 0  . glucose blood test strip 1 each by Other route 3 (three) times daily. Use as instructed 100 each 12  . Lancets (ONETOUCH ULTRASOFT) lancets 1 each by Other route 3 (three) times daily. Use as instructed 100 each 12  . medroxyPROGESTERone (PROVERA) 10 MG tablet Take 1  tablet (10 mg total) by mouth daily. 10 tablet 0   No current facility-administered medications on file prior to visit.     Allergies  Allergen Reactions  . Dexamethasone Sodium Phosphate Other (See Comments)    Pt states after receiving injection had severe nosebleeds, bruising, blood blister on lip     ROS: Review of Systems  Constitutional: Positive for activity change (exercising more). Negative for appetite change and fatigue.  Respiratory: Negative for cough.   Cardiovascular: Negative for chest pain.  Psychiatric/Behavioral: The patient is nervous/anxious.      PHYSICAL EXAM: BP (!) 143/88 (BP Location: Right Arm, Patient Position: Sitting, Cuff Size: Large)   Pulse 86   Temp 98.2 F (36.8 C) (Oral)   Resp 18   Ht _0  (1.6 m)   Wt 238 lb (108 kg)   LMP 07/27/2014 (Approximate)    SpO2 98%   BMI 42.16 kg/m   Repeat BP 14095 Wt Readings from Last 3 Encounters:  06/25/16 238 lb (108 kg)  06/17/16 234 lb (106.1 kg)  12/17/15 235 lb (106.6 kg)   Physical Exam  Constitutional:  EOMI obese African-American female in NAD  Eyes: EOM are normal. Pupils are equal, round, and reactive to light.  Neck: No thyromegaly present.  Cardiovascular: Normal rate and regular rhythm.   No murmur heard. Pulmonary/Chest: Effort normal and breath sounds normal.  Lymphadenopathy:    She has no cervical adenopathy.  Psychiatric: She has a normal mood and affect.   Diabetic Foot Exam - Simple   Simple Foot Form Diabetic Foot exam was performed with the following findings:  Yes 06/25/2016 12:50 PM  Visual Inspection No deformities, no ulcerations, no other skin breakdown bilaterally:  Yes Sensation Testing Intact to touch and monofilament testing bilaterally:  Yes Pulse Check Posterior Tibialis and Dorsalis pulse intact bilaterally:  Yes Comments      Depression screen PHQ 2/9 06/25/2016  Decreased Interest 2  Down, Depressed, Hopeless 0  PHQ - 2 Score 2  Altered sleeping 3  Tired, decreased energy 3  Change in appetite 0  Feeling bad or failure about yourself  0  Trouble concentrating 0  Moving slowly or fidgety/restless 0  Suicidal thoughts 0  PHQ-9 Score 8     ASSESSMENT AND PLAN: 1. Uncontrolled type 2 diabetes mellitus without complication, with long-term current use of insulin (HCC) -Stop metformin. Continue Janumet. Dispense 5 shots a day R to March for her I recommended decreasing the NovoLog to 2 shots with her 2 largest meals of the day. Refilled Btetta. -Discussed the importance of healthy eating habits, regular aerobic exercise (at least 150 minutes a week as tolerated) and medication compliance to achieve or maintain control of diabetes. -1 mth appt with clinical pharmacist.  Advised to bring in BS log for review.  - POCT glucose (manual entry) -  NOVOLOG 100 UNIT/ML injection; Inject 60 Units into the skin 2 (two) times daily before a meal.  Dispense: 10 mL; Refill: 11 - SitaGLIPtin-MetFORMIN HCl (JANUMET XR) [phone] MG TB24; Take 1 tablet by mouth daily.  Dispense: 90 tablet; Refill: 3 - exenatide (BYETTA 5 MCG PEN) 5 MCG/0.02ML SOPN injection; Inject 0.02 mLs (5 mcg total) into the skin 2 (two) times daily with a meal.  Dispense: 3 pen; Refill: 6  2. Essential hypertension, benign -DASH diet discussed and encouraged -Recommended increasing HCTZ to 25 mg daily. Patient wants to hold off and have blood pressure repeated when she sees the clinical pharmicist in 1  mth. If blood pressure is still elevated at that time I recommend increasing to 25 mg daily. - hydrochlorothiazide (HYDRODIURIL) 12.5 MG tablet; Take 1 tablet (12.5 mg total) by mouth daily.  Dispense: 30 tablet; Refill: 5  3. Abnormal LFTs -DDX includes fatty liver associated with diabetes and obesity versus chronic hep C. Healthy eating habit and reck regular exercise encouraged. -Patient declined blood draw for hep C level screening today.  4. Hypertriglyceridemia - gemfibrozil (LOPID) 600 MG tablet; Take 1 tablet (600 mg total) by mouth 2 (two) times daily before a meal.  Dispense: 60 tablet; Refill: 3  5. Anxiety and depression -Patient feels anxiety symptoms are mild to moderate. However she declines medication or referral to behavioral health specialist at this time. She will let me know if she changes her mind.  6. Refused Streptococcus pneumoniae vaccination  Patient had to leave for work before AVS was printed so I will have front desk schedule her one-month appointment with the clinical pharmacist and three-month follow-up appointment with me. Patient was given the opportunity to ask questions.  Patient verbalized understanding of the plan and was able to repeat key elements of the plan.   Orders Placed This Encounter  Procedures  . POCT glucose (manual entry)      Requested Prescriptions   Signed Prescriptions Disp Refills  . NOVOLOG 100 UNIT/ML injection 10 mL 11    Sig: Inject 60 Units into the skin 2 (two) times daily before a meal.  . SitaGLIPtin-MetFORMIN HCl (JANUMET XR) [phone] MG TB24 90 tablet 3    Sig: Take 1 tablet by mouth daily.  . hydrochlorothiazide (HYDRODIURIL) 12.5 MG tablet 30 tablet 5    Sig: Take 1 tablet (12.5 mg total) by mouth daily.  Marland Kitchen gemfibrozil (LOPID) 600 MG tablet 60 tablet 3    Sig: Take 1 tablet (600 mg total) by mouth 2 (two) times daily before a meal.  . exenatide (BYETTA 5 MCG PEN) 5 MCG/0.02ML SOPN injection 3 pen 6    Sig: Inject 0.02 mLs (5 mcg total) into the skin 2 (two) times daily with a meal.    Future Appointments Date Time Provider [place]  06/22/2017 8:00 AM Salvadore Dom, MD [facility] None    Karle Plumber, MD, FACP

## 2016-06-30 ENCOUNTER — Other Ambulatory Visit: Payer: Self-pay | Admitting: Pharmacist

## 2016-06-30 MED ORDER — INSULIN LISPRO 100 UNIT/ML ~~LOC~~ SOLN: 60.0000 [IU] | Freq: Two times a day (BID) | SUBCUTANEOUS | 11 refills | Status: DC

## 2016-07-08 ENCOUNTER — Telehealth: Payer: Self-pay | Admitting: Internal Medicine

## 2016-07-08 NOTE — Telephone Encounter (Signed)
Called pt to set appts fro Tanya FishStacey Hammer as well as f/u with Dr Laural BenesJohnson, no answer, left pt vm to please return call to set up appts.

## 2016-07-08 NOTE — Telephone Encounter (Signed)
-----   Message from Marcine Matareborah B Johnson, MD sent at 06/25/2016  1:04 PM EDT ----- I saw this patient today. She left before her AVS was printed because she had to go to work. Please schedule a one month appointment with the clinical pharmacist, Kennyth ArnoldStacy.  Give three-month follow-up appointment with me. Thanks

## 2016-07-15 ENCOUNTER — Telehealth: Payer: Self-pay | Admitting: Obstetrics and Gynecology

## 2016-07-15 DIAGNOSIS — N939 Abnormal uterine and vaginal bleeding, unspecified: Secondary | ICD-10-CM

## 2016-07-15 NOTE — Telephone Encounter (Signed)
Patient is calling to give an update after taking provera.

## 2016-07-15 NOTE — Telephone Encounter (Signed)
The patient just had 2 years of amenorrhea. Labs were drawn and she was treated with provera. Please check with the patient how quickly she was saturating a pad or a tampon. She reports in the past that she has only ever had light flow. Because she went such a long time without a cycle, particularly if she had anything other than very light bleeding, I would recommend that she have a endometrial biopsy to r/o hyperplasia. After the biopsy, I would recommend either contraception or cyclic provera to keep her endometrium protected.  I know the patient wants to get pregnant, but it is dangerous for her to get pregnant with her diabetes out of control.  She should be using condoms every time she has sex.

## 2016-07-15 NOTE — Telephone Encounter (Signed)
Spoke with patient, calling with update from provera. Reports started cycle on 07/09/16, initially heavy, slowing down now. Advised patient would update Dr. Oscar LaJertson and return call with any additional recommendations, patient is agreeable.  Dr. Oscar LaJertson, please review, any additional recommendations?

## 2016-07-16 NOTE — Telephone Encounter (Signed)
Left message to call Teresina Bugaj at [phone]. 

## 2016-07-16 NOTE — Telephone Encounter (Signed)
Spoke with patient. Patient states the first day she started her menses after taking Provera she changed her tampon/pad 2 times. The second day she changed it 3 times, and the fourth day 4-5 times. "It was pretty heavy." Advised of recommendations as seen below from Dr.Jertson regarding EMB. Patient verbalizes understanding and is very worried about discomfort with procedure and financial responsibility. Asking if she may have a PUS first and EMB after if needed. If she needs EMB she is asking for pre-medication for pain and anxiety. Advised will review with Dr.Jertson and return call. Patient is agreeable and is aware Dr.Jertson is out of the office today.

## 2016-07-19 NOTE — Telephone Encounter (Signed)
I thinks she should have a biopsy, I'm worried about her risk of endometrial hyperplasia. We certainly can start with the ultrasound. We can pre-treat her with ativan if she can have someone give her a ride to and from the appointment. She should take 800 mg of ibuprofen prior to the appointment.  Ativan 1 mg po 1 hour prior to the appointment. #1, no refills

## 2016-07-20 NOTE — Telephone Encounter (Signed)
Spoke with patient. Advised of message as seen below from Dr.Jertson. Patient states "I have no one that could bring me to the appointment." States she has no one to ask. Advised of importance of having evaluation and why having a biopsy is important to rule out endometrial hyperplasia. "I just do not want to go through with it and it come back normal. That would be unnecessary torture. It sounds awful." Again explained importance of biopsy and ways we can provide pre-medication. Offered an OV to discuss with Dr.Jertson. Patient declines. "I have had all of the other testing before just not the biopsy and it is terrifying." Requesting to speak with Dr.Jerson before proceeding with scheduling. Advised will speak with Dr.Jertson, but cannot provide time of return call as Dr.Jertson is seeing patients. Patient is agreeable.  Patient is available today after 4 pm and tomorrow before 9:30 am and after 4 pm.

## 2016-07-22 NOTE — Telephone Encounter (Signed)
Spoke with patient. Advising calling on behalf of Dr.Jertson. Advised Dr.Jertson had a family emergency and is sorry she was unable to return call. Per Dr.Jertson patient may have PUS and discuss concerns at that appointment or she will be happy to call her next week. Patient states she is okay waiting until next week to speak with Dr.Jertson and wishes her the best with her family emergency.

## 2016-07-26 MED ORDER — LORAZEPAM 1 MG PO TABS: ORAL_TABLET | ORAL | 0 refills | Status: DC

## 2016-07-26 NOTE — Telephone Encounter (Signed)
I spoke with the patient, explained the reasoning of the endometrial biopsy. Discussed the risk of endometrial hyperplasia and cancer. Discussed the biopsy catheter.  Will pre-treat with ativan and ibuprofen. She knows she needs someone to drive her She will use condoms for the next 2 weeks (last unprotected intercourse was 2 days ago) We also discussed how dangerous it would be for her to get pregnant now with her diabetes out of control.  After the biopsy will further discuss contraception vs cyclic provera.  Patient is in agreement Will close the encounter

## 2016-07-27 ENCOUNTER — Telehealth: Payer: Self-pay

## 2016-07-27 NOTE — Telephone Encounter (Signed)
Spoke with patient. Appointment for EMB scheduled for 08/23/2016 at 4 pm. Patient declines all earlier appointments due to work. Aware she will need to use condoms with intercourse until after procedure (see telephone encounter date 07/15/2016). Rx for Lorazepam 1 mg take 1 tablet 1 hour before procedure #1 [facility] (see note from 07/15/2016 regarding rx).  Routing to provider for final review. Patient agreeable to disposition. Will close encounter.

## 2016-08-05 ENCOUNTER — Other Ambulatory Visit: Payer: Self-pay | Admitting: Family Medicine

## 2016-08-05 DIAGNOSIS — Z794 Long term (current) use of insulin: Principal | ICD-10-CM

## 2016-08-05 DIAGNOSIS — IMO0001 Reserved for inherently not codable concepts without codable children: Secondary | ICD-10-CM

## 2016-08-05 DIAGNOSIS — E1165 Type 2 diabetes mellitus with hyperglycemia: Principal | ICD-10-CM

## 2016-08-23 ENCOUNTER — Ambulatory Visit: Payer: Managed Care, Other (non HMO) | Admitting: Obstetrics and Gynecology

## 2016-08-24 ENCOUNTER — Ambulatory Visit (INDEPENDENT_AMBULATORY_CARE_PROVIDER_SITE_OTHER): Payer: Managed Care, Other (non HMO) | Admitting: Obstetrics and Gynecology

## 2016-08-24 VITALS — BP 142/88 | HR 88 | Resp 16 | Wt 245.0 lb

## 2016-08-24 DIAGNOSIS — E282 Polycystic ovarian syndrome: Secondary | ICD-10-CM | POA: Diagnosis not present

## 2016-08-24 DIAGNOSIS — N912 Amenorrhea, unspecified: Secondary | ICD-10-CM | POA: Diagnosis not present

## 2016-08-24 DIAGNOSIS — N939 Abnormal uterine and vaginal bleeding, unspecified: Secondary | ICD-10-CM | POA: Diagnosis not present

## 2016-08-24 DIAGNOSIS — Z6841 Body Mass Index (BMI) 40.0 and over, adult: Secondary | ICD-10-CM

## 2016-08-24 DIAGNOSIS — Z8639 Personal history of other endocrine, nutritional and metabolic disease: Secondary | ICD-10-CM | POA: Diagnosis not present

## 2016-08-24 NOTE — Patient Instructions (Signed)
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
## 2016-08-25 ENCOUNTER — Other Ambulatory Visit: Payer: Self-pay | Admitting: Family Medicine

## 2016-08-25 DIAGNOSIS — E1165 Type 2 diabetes mellitus with hyperglycemia: Principal | ICD-10-CM

## 2016-08-25 DIAGNOSIS — IMO0001 Reserved for inherently not codable concepts without codable children: Secondary | ICD-10-CM

## 2016-08-25 DIAGNOSIS — Z794 Long term (current) use of insulin: Principal | ICD-10-CM

## 2016-08-25 MED FILL — HYDROCHLOROTHIAZIDE 12.5 MG: 12.5 | 30 days supply | Qty: 30 | Fill #1

## 2016-08-25 MED FILL — GEMFIBROZIL 600 MG TABLET: 600 | 30 days supply | Qty: 60 | Fill #1

## 2016-08-27 ENCOUNTER — Other Ambulatory Visit: Payer: Self-pay | Admitting: Family Medicine

## 2016-08-27 DIAGNOSIS — Z794 Long term (current) use of insulin: Principal | ICD-10-CM

## 2016-08-27 DIAGNOSIS — E1165 Type 2 diabetes mellitus with hyperglycemia: Principal | ICD-10-CM

## 2016-08-27 DIAGNOSIS — IMO0001 Reserved for inherently not codable concepts without codable children: Secondary | ICD-10-CM

## 2016-08-30 ENCOUNTER — Telehealth: Payer: Self-pay

## 2016-08-30 NOTE — Telephone Encounter (Signed)
Left message to call Kaitlyn at [phone]. 

## 2016-08-30 NOTE — Telephone Encounter (Signed)
Patient is returning a call to Kaitlyn. °

## 2016-08-30 NOTE — Telephone Encounter (Signed)
-----   Message from Romualdo BolkJill Evelyn Jertson, MD sent at 08/29/2016  8:09 AM EDT ----- Please inform the patient that her endometrial biopsy was benign (scant, but this is c/w her recent w/d bleed to provera). Please stress to the patient that she needs endometrial protection with either contraception or cyclic provera. She does want to get pregnant, but is aware that she needs to get better control of her HgbA1c prior to attempting pregnancy. She could go on progesterone only pills, or have a mirena IUD inserted. My guess is she won't want to do this. Other option is cyclic provera, 5 mg x 5 days every other month if no spontaneous menses #15, 1 refill. If she chooses the provera, she should be very careful to use condoms every time she is active (she might spontaneously ovulate), she should also check a UPT prior to taking the provera. Once her diabetes is under control, we can discuss ovulation induction.

## 2016-08-31 MED ORDER — MEDROXYPROGESTERONE ACETATE 5 MG PO TABS: ORAL_TABLET | ORAL | 1 refills | Status: DC

## 2016-08-31 NOTE — Telephone Encounter (Signed)
Spoke with patient. Advised of results and message as seen below from Dr.Jertson. Patient verbalizes understanding. Would like to start on cyclic provera. LMP was 07/09/2016. Has not had menses this month. Aware she will need to take provera in August if she does not have a menses. Aware she will need to take provera 5 mg x 5 days every other month if no spontaneous menses. Will need to use condoms every time she is sexually active and check UPT before taking. Patient verbalizes understanding. Patient is going to call to schedule her follow that is due with her PCP in August regarding diabetes. Rx for Provera 5 mg x 5 days if no spontaneous menses #15 1RF sent to pharmacy on file.  Routing to provider for final review. Patient agreeable to disposition. Will close encounter.

## 2016-09-20 MED FILL — HYDROCHLOROTHIAZIDE 12.5 MG: 12.5 | 30 days supply | Qty: 30 | Fill #2

## 2016-09-20 MED FILL — GEMFIBROZIL 600 MG TABLET: 600 | 30 days supply | Qty: 60 | Fill #2

## 2016-09-27 ENCOUNTER — Ambulatory Visit: Payer: 59 | Admitting: Internal Medicine

## 2016-09-28 ENCOUNTER — Encounter: Payer: Self-pay | Admitting: Internal Medicine

## 2016-09-28 ENCOUNTER — Ambulatory Visit: Payer: 59 | Attending: Internal Medicine | Admitting: Internal Medicine

## 2016-09-28 VITALS — BP 139/97 | HR 94 | Temp 98.8°F | Ht 63.0 in | Wt 239.2 lb

## 2016-09-28 DIAGNOSIS — IMO0001 Reserved for inherently not codable concepts without codable children: Secondary | ICD-10-CM

## 2016-09-28 DIAGNOSIS — F329 Major depressive disorder, single episode, unspecified: Secondary | ICD-10-CM | POA: Diagnosis not present

## 2016-09-28 DIAGNOSIS — D869 Sarcoidosis, unspecified: Secondary | ICD-10-CM | POA: Insufficient documentation

## 2016-09-28 DIAGNOSIS — Z794 Long term (current) use of insulin: Secondary | ICD-10-CM

## 2016-09-28 DIAGNOSIS — Z8249 Family history of ischemic heart disease and other diseases of the circulatory system: Secondary | ICD-10-CM | POA: Insufficient documentation

## 2016-09-28 DIAGNOSIS — E1121 Type 2 diabetes mellitus with diabetic nephropathy: Secondary | ICD-10-CM | POA: Insufficient documentation

## 2016-09-28 DIAGNOSIS — Z79899 Other long term (current) drug therapy: Secondary | ICD-10-CM | POA: Insufficient documentation

## 2016-09-28 DIAGNOSIS — F419 Anxiety disorder, unspecified: Secondary | ICD-10-CM | POA: Diagnosis not present

## 2016-09-28 DIAGNOSIS — E282 Polycystic ovarian syndrome: Secondary | ICD-10-CM | POA: Insufficient documentation

## 2016-09-28 DIAGNOSIS — F32A Depression, unspecified: Secondary | ICD-10-CM

## 2016-09-28 DIAGNOSIS — E1165 Type 2 diabetes mellitus with hyperglycemia: Secondary | ICD-10-CM | POA: Diagnosis not present

## 2016-09-28 DIAGNOSIS — Z6841 Body Mass Index (BMI) 40.0 and over, adult: Secondary | ICD-10-CM | POA: Diagnosis not present

## 2016-09-28 DIAGNOSIS — R7989 Other specified abnormal findings of blood chemistry: Secondary | ICD-10-CM

## 2016-09-28 DIAGNOSIS — Z888 Allergy status to other drugs, medicaments and biological substances status: Secondary | ICD-10-CM | POA: Diagnosis not present

## 2016-09-28 DIAGNOSIS — Z87891 Personal history of nicotine dependence: Secondary | ICD-10-CM | POA: Insufficient documentation

## 2016-09-28 DIAGNOSIS — I10 Essential (primary) hypertension: Secondary | ICD-10-CM | POA: Insufficient documentation

## 2016-09-28 DIAGNOSIS — E119 Type 2 diabetes mellitus without complications: Secondary | ICD-10-CM | POA: Diagnosis present

## 2016-09-28 DIAGNOSIS — E781 Pure hyperglyceridemia: Secondary | ICD-10-CM | POA: Diagnosis not present

## 2016-09-28 DIAGNOSIS — R945 Abnormal results of liver function studies: Secondary | ICD-10-CM | POA: Diagnosis not present

## 2016-09-28 LAB — GLUCOSE, POCT (MANUAL RESULT ENTRY): POC GLUCOSE: 214 mg/dL — AB (ref 70–99)

## 2016-09-28 LAB — POCT GLYCOSYLATED HEMOGLOBIN (HGB A1C): HEMOGLOBIN A1C: 9.2

## 2016-09-28 MED ORDER — INSULIN NPH ISOPHANE & REGULAR (70-30) 100 UNIT/ML ~~LOC~~ SUSP
68.0000 [IU] | Freq: Two times a day (BID) | SUBCUTANEOUS | 11 refills | Status: DC
Start: 2016-09-28 — End: 2017-06-22

## 2016-09-28 MED ORDER — METFORMIN HCL 1000 MG PO TABS: 1000.0000 mg | ORAL_TABLET | Freq: Two times a day (BID) | ORAL | 6 refills | Status: DC

## 2016-09-28 MED ORDER — LISINOPRIL 5 MG PO TABS: 2.5000 mg | ORAL_TABLET | Freq: Every day | ORAL | 3 refills | Status: DC

## 2016-09-28 NOTE — Progress Notes (Signed)
Patient ID: Tanya Hardy, female    DOB: 1980-05-18  MRN: [id number]  CC: No chief complaint on file.   Subjective: Tanya Hardy is a 36 y.o. female who presents for 3 mth f/u. Last seen 06/2016 Her concerns today include:  Pt with hx of HTN, DM type 2, obesity, sarcoidosis, elev TG, anxiety/dep, abn LFTs   1. DM -compliant with Janumet, had some left over. Never got Byetta and Novolog due to costly co-pay. -purchase Novolin 70/30 ($24 a vile) OTC.  Taking 60 units BID; sometimes she has to take 100 units BID when BS high.  Will not be able to afford Janumet once out of current bottle.  -wants to get back on Metformin -due for eye exam. She will check with insurance and schedule self. + blurred vision.  -Eating habits: "portion control could be better."  Would like referral to nutritionist. -Swimming QOD for exercise.  Low energy  2.  HTN -compliant with HCTZ and salt restriction.  -SBP has been 140s/120s at job screening recently. No device at home  3.  Amenorrhea: had negative EMB last mth. Now on cyclic Provera every other mth.  4.  HL: tolerating Lopid. -she still does not want Hep C check. Concern about cost and does not feel she has risk factors  5. Anxiety: "Just as bad as it's every been.  Always there. It doesn't go away."  Has problem sleeping.  -Feels her depression not too bad.  -"I don't want to be a zambi."  -seeing a marriage counselor Q 2-3 mths. Patient Active Problem List   Diagnosis Date Noted  . Amenorrhea 12/17/2015  . Severe obesity (BMI >= 40) ([place]) 05/29/2015  . Macroalbuminuric diabetic nephropathy ([place]) 10/01/2014  . Secondary amenorrhea 09/30/2014  . PCOS (polycystic ovarian syndrome) 09/30/2014  . Diabetes type 2, uncontrolled ([place]) 07/04/2013  . Essential hypertension, benign 07/04/2013  . Hypertriglyceridemia 07/04/2013  . Sarcoidosis 10/06/2012  . Pulmonary infiltrates 09/15/2012  . Chronic rhinitis 04/20/2012  . Lymphadenopathy,  mediastinal 03/27/2012     Current Outpatient Prescriptions on File Prior to Visit  Medication Sig Dispense Refill  . Blood Glucose Monitoring Suppl (ONE TOUCH ULTRA SYSTEM KIT) W/DEVICE KIT 1 kit by Does not apply route once. 1 each 0  . gemfibrozil (LOPID) 600 MG tablet Take 1 tablet (600 mg total) by mouth 2 (two) times daily before a meal. 60 tablet 3  . glucose blood test strip 1 each by Other route 3 (three) times daily. Use as instructed 100 each 12  . hydrochlorothiazide (HYDRODIURIL) 12.5 MG tablet Take 1 tablet (12.5 mg total) by mouth daily. 30 tablet 5  . Lancets (ONETOUCH ULTRASOFT) lancets 1 each by Other route 3 (three) times daily. Use as instructed 100 each 12  . medroxyPROGESTERone (PROVERA) 5 MG tablet 5 mg x 5 days every other month is no spontaneous menses 15 tablet 1  . SitaGLIPtin-MetFORMIN HCl (JANUMET XR) [phone] MG TB24 Take 1 tablet by mouth daily. 90 tablet 3   No current facility-administered medications on file prior to visit.     Allergies  Allergen Reactions  . Dexamethasone Sodium Phosphate Other (See Comments)    Pt states after receiving injection had severe nosebleeds, bruising, blood blister on lip    Social History   Social History  . Marital status: Married    Spouse name: N/A  . Number of children: 0  . Years of education: N/A   Occupational History  . Works at a [employer]  Social History Main Topics  . Smoking status: Former Smoker    Packs/day: 0.25    Years: 11.00    Types: Cigarettes    Quit date: 04/07/2012  . Smokeless tobacco: Never Used  . Alcohol use Yes     Comment: occasional  . Drug use: No  . Sexual activity: Yes    Partners: Male    Birth control/ protection: None   Other Topics Concern  . Not on file   Social History Narrative  . No narrative on file    Family History  Problem Relation Age of Onset  . Hypertension Mother   . Sarcoidosis Brother   . Hypertension Brother   . Uterine cancer Paternal Aunt    . Brain cancer Paternal Grandmother   . Diabetes Paternal Grandmother     Past Surgical History:  Procedure Laterality Date  . NASAL SINUS SURGERY N/A 09/15/2012   Procedure: BILATERAL ENDOSCOPIC MAXILLARY ANTROSTOMY/ETHMOIDECTOMY/FRONTAL SINUS SURGERY and sphenoidotomy;  Surgeon: Izora Gala, MD;  Location: [place];  Service: ENT;  Laterality: N/A;  . VIDEO BRONCHOSCOPY Bilateral 05/15/2012   pt styated not completed    ROS: Review of Systems Neg except as stated above  PHYSICAL EXAM: BP (!) 139/97 (BP Location: Left Arm, Patient Position: Sitting, Cuff Size: Large)   Pulse 94   Temp 98.8 F (37.1 C) (Oral)   Ht '5\' 3"'$  (1.6 m)   Wt 239 lb 3.2 oz (108.5 kg)   LMP 07/09/2016 (Approximate)   SpO2 97%   BMI 42.37 kg/m   Wt Readings from Last 3 Encounters:  09/28/16 239 lb 3.2 oz (108.5 kg)  08/24/16 245 lb (111.1 kg)  06/25/16 238 lb (108 kg)  Repeat BP 120/95   Physical Exam EOMI obese African-American female in NAD  Eyes: EOM are normal. Pupils are equal, round, and reactive to light.  Neck: No thyromegaly present.  Cardiovascular: Normal rate and regular rhythm.   No murmur heard. Pulmonary/Chest: Effort normal and breath sounds normal.  Lymphadenopathy:    She has no cervical adenopathy.  Psychiatric: She has a normal mood and affect  Results for orders placed or performed in visit on 09/28/16  POCT glycosylated hemoglobin (Hb A1C)  Result Value Ref Range   Hemoglobin A1C 9.2   POCT glucose (manual entry)  Result Value Ref Range   POC Glucose 214 (A) 70 - 99 mg/dl   Depression screen [facility] 2/9 09/28/2016 06/25/2016 02/24/2015 09/30/2014  Decreased Interest '3 2 3 '$ 0  Down, Depressed, Hopeless 2 0 1 0  PHQ - 2 Score '5 2 4 '$ 0  Altered sleeping '3 3 3 '$ -  Tired, decreased energy '3 3 2 '$ -  Change in appetite 3 0 2 -  Feeling bad or failure about yourself  0 0 0 -  Trouble concentrating 3 0 2 -  Moving slowly or fidgety/restless 0 0 0 -  Suicidal thoughts 0 0 0 -  PHQ-9  Score '17 8 13 '$ -   GAD 7 : Generalized Anxiety Score 09/28/2016 06/25/2016 02/24/2015  Nervous, Anxious, on Edge 3 2 0  Control/stop worrying 2 2 0  Worry too much - different things 1 0 1  Trouble relaxing 3 3 0  Restless 2 2 0  Easily annoyed or irritable '2 2 3  '$ Afraid - awful might happen 0 0 0  Total GAD 7 Score '13 11 4  '$ Anxiety Difficulty - Somewhat difficult -     ASSESSMENT AND PLAN: 1. Uncontrolled type 2 diabetes mellitus  without complication, with long-term current use of insulin (HCC) -Continue Novolin 70/30. Increase dose to 68 units twice a day with meals Continue Janumet until current bottle is completed. Start taking metformin 1 g daily at bedtime until she completes Janumet. Then increase metformin to 1 g twice a day. -Referred to nutritionist -Dietary counseling given - POCT glycosylated hemoglobin (Hb A1C) - POCT glucose (manual entry) - Microalbumin / creatinine urine ratio - metFORMIN (GLUCOPHAGE) 1000 MG tablet; Take 1 tablet (1,000 mg total) by mouth 2 (two) times daily with a meal.  Dispense: 60 tablet; Refill: 6 - Ambulatory referral to diabetic education - insulin NPH-regular Human (NOVOLIN 70/30) (70-30) 100 UNIT/ML injection; Inject 68 Units into the skin 2 (two) times daily with a meal.  Dispense: 10 mL; Refill: 11  2. Essential hypertension -Not at goal. Continue HCTZ and add low-dose lisinopril DASH diet and encouraged - lisinopril (PRINIVIL,ZESTRIL) 5 MG tablet; Take 0.5 tablets (2.5 mg total) by mouth daily.  Dispense: 45 tablet; Refill: 3  3. 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 Weight loss encouraged. Patient declines hepatitis C screening  4. Anxiety and depression -I think she would benefit from medication but patient does not want to be on meds at this time. She also declines referral to a therapist stating that she is already seeing one in marriage counseling  5. Class 3 severe obesity due to excess calories with serious comorbidity and body  mass index (BMI) of 40.0 to 44.9 in adult [facility]) Dietary and exercise counseling given   Patient was given the opportunity to ask questions.  Patient verbalized understanding of the plan and was able to repeat key elements of the plan.   Orders Placed This Encounter  Procedures  . Microalbumin / creatinine urine ratio  . Ambulatory referral to diabetic education  . POCT glycosylated hemoglobin (Hb A1C)  . POCT glucose (manual entry)     Requested Prescriptions   Signed Prescriptions Disp Refills  . lisinopril (PRINIVIL,ZESTRIL) 5 MG tablet 45 tablet 3    Sig: Take 0.5 tablets (2.5 mg total) by mouth daily.  . metFORMIN (GLUCOPHAGE) 1000 MG tablet 60 tablet 6    Sig: Take 1 tablet (1,000 mg total) by mouth 2 (two) times daily with a meal.  . insulin NPH-regular Human (NOVOLIN 70/30) (70-30) 100 UNIT/ML injection 10 mL 11    Sig: Inject 68 Units into the skin 2 (two) times daily with a meal.    Return in about 3 years (around 09/29/2019).  Karle Plumber, MD, FACP

## 2016-09-28 NOTE — Patient Instructions (Addendum)
Diabetes: increase Novolin 70/30 to 68 units twice a day.  Continue the Janumet until you're done with your current bottle. Start taking metformin 1000 mg daily in the evening until you are done with Janumet.   -Once you have completed current bottle of Janumet, start taking Metformin 1000 mg twice a day as discussed.   -Please schedule an appointment to see an ophthalmologist for diabetic eye exam -You have been referred to a nutritionist per your request.  High blood pressure: Continue HCTZ. Add lisinopril 5  Mg 1/2 tab daily.  Follow a Healthy Eating Plan - You can do it! Limit sugary drinks.  Avoid sodas, sweet tea, sport or energy drinks, or fruit drinks.  Drink water, lo-fat milk, or diet drinks. Limit snack foods.   Cut back on candy, cake, cookies, chips, ice cream.  These are a special treat, only in small amounts. Eat plenty of vegetables.  Especially dark green, red, and orange vegetables. Aim for at least 3 servings a day. More is better! Include fruit in your daily diet.  Whole fruit is much healthier than fruit juice! Limit "white" bread, "white" pasta, "white" rice.   Choose "100% whole grain" products, brown or wild rice. Avoid fatty meats. Try "Meatless Monday" and choose eggs or beans one day a week.  When eating meat, choose lean meats like chicken, [place], and fish.  Grill, broil, or bake meats instead of frying, and eat poultry without the skin. Eat less salt.  Avoid frozen pizzas, frozen dinners and salty foods.  Use seasonings other than salt in cooking.  This can help blood pressure and keep you from swelling Beer, wine and liquor have calories.  If you can safely drink alcohol, limit to 1 drink per day for women, 2 drinks for men

## 2016-09-28 NOTE — Progress Notes (Signed)
Pt here for f/u  Discuss medications : HTN /DM Pt fasting

## 2016-09-29 ENCOUNTER — Telehealth: Payer: Self-pay | Admitting: Internal Medicine

## 2016-09-29 DIAGNOSIS — I10 Essential (primary) hypertension: Secondary | ICD-10-CM

## 2016-09-29 LAB — MICROALBUMIN / CREATININE URINE RATIO
CREATININE, UR: 111.2 mg/dL
MICROALB/CREAT RATIO: 1411.8 mg/g{creat} — AB (ref 0.0–30.0)
MICROALBUM., U, RANDOM: 1569.9 ug/mL

## 2016-09-29 MED ORDER — LISINOPRIL 5 MG PO TABS: 5.0000 mg | ORAL_TABLET | Freq: Every day | ORAL | 3 refills | Status: DC

## 2016-09-29 NOTE — Telephone Encounter (Signed)
Pc placed today. I left VMM letting pt know that she has large amounts of protein in the urine which could be early indication of DM affecting the kidneys.  In light of this, I recommend increasing Lisinopril from 2.5 to 5 mg daily.  I also sent pt message via Mychart.

## 2016-10-14 ENCOUNTER — Ambulatory Visit: Payer: 59 | Admitting: Skilled Nursing Facility1

## 2016-10-21 ENCOUNTER — Ambulatory Visit: Payer: 59 | Admitting: Registered"

## 2017-06-22 ENCOUNTER — Encounter: Payer: Self-pay | Admitting: Obstetrics and Gynecology

## 2017-06-22 ENCOUNTER — Ambulatory Visit: Payer: 59 | Admitting: Obstetrics and Gynecology

## 2017-06-22 ENCOUNTER — Other Ambulatory Visit: Payer: Self-pay

## 2017-06-22 VITALS — BP 142/80 | HR 84 | Resp 16 | Ht 62.75 in | Wt 233.0 lb

## 2017-06-22 DIAGNOSIS — Z8639 Personal history of other endocrine, nutritional and metabolic disease: Secondary | ICD-10-CM

## 2017-06-22 DIAGNOSIS — Z01419 Encounter for gynecological examination (general) (routine) without abnormal findings: Secondary | ICD-10-CM | POA: Diagnosis not present

## 2017-06-22 DIAGNOSIS — Z113 Encounter for screening for infections with a predominantly sexual mode of transmission: Secondary | ICD-10-CM | POA: Diagnosis not present

## 2017-06-22 DIAGNOSIS — N941 Unspecified dyspareunia: Secondary | ICD-10-CM | POA: Diagnosis not present

## 2017-06-22 DIAGNOSIS — Z0000 Encounter for general adult medical examination without abnormal findings: Secondary | ICD-10-CM | POA: Diagnosis not present

## 2017-06-22 DIAGNOSIS — N915 Oligomenorrhea, unspecified: Secondary | ICD-10-CM | POA: Diagnosis not present

## 2017-06-22 DIAGNOSIS — F418 Other specified anxiety disorders: Secondary | ICD-10-CM | POA: Diagnosis not present

## 2017-06-22 MED ORDER — CITALOPRAM HYDROBROMIDE 20 MG PO TABS: ORAL_TABLET | ORAL | 1 refills | Status: DC

## 2017-06-22 MED ORDER — MEDROXYPROGESTERONE ACETATE 5 MG PO TABS: ORAL_TABLET | ORAL | 1 refills | Status: DC

## 2017-06-22 NOTE — Patient Instructions (Signed)

## 2017-06-22 NOTE — Progress Notes (Signed)
37 y.o. G1P0010 MarriedAfrican AmericanF here for annual exam.  H/O oligomenorrhea. She has been taking the provera if she goes more than 2 months without a cycle. The last few months she has had cycles on her own. She last took the provera early this year. Cycles have been very light in the last year, sometimes she doesn't need a panty liner. Other months she would change a pad in 8 hours.  Period Duration (Days): 3-6 days  Period Pattern: (!) Irregular Menstrual Flow: Light Menstrual Control: Panty liner Dysmenorrhea: None  H/O PCOS, HTN, DM (with nephropathy). She has been having trouble with insurance and needs an in network provider.  She is checking her FS ~1 x a week, random times, running from 190-500+ (the 500+ # was in November). She doesn't have an appetite. Is under lots of stress. Stress in her marriage.   She has trouble with pain with sex. Even with lubrication her tissue is sore. The perineum is very tight. After sex it burns. No libido. Sex has been painful for the last 1-2 years, infrequent because of it. Causing tension in her relationship.  She is down and anxious.  Partner has cheated on her in the past and she recently caught him having a relationship with another woman (doesn't think they are sexually active). Under lots of stress. Not trying to get pregnant, but not using protection.    Patient's last menstrual period was 06/19/2017.          Sexually active: Yes.    The current method of family planning is none.    Exercising: No.  The patient does not participate in regular exercise at present. Smoker:  Former smoker  Health Maintenance: Pap:  06-17-16 WNl NEG HR HPV 05-10-13 WNL  History of abnormal Pap:  no MMG:  Never Colonoscopy:  Never BMD:   Never TDaP:  06-17-16 Gardasil: no    reports that she quit smoking about 5 years ago. Her smoking use included cigarettes and e-cigarettes. She has a 2.75 pack-year smoking history. She has never used smokeless tobacco.  She reports that she drinks about 3.0 oz of alcohol per week. She reports that she does not use drugs.Working in a call center. Has 4 teenage children (adopted)   Past Medical History:  Diagnosis Date  . Amenorrhea   . Anxiety   . Chronic sinusitis   . Complication of anesthesia    anesthesia not strong enough, fought during surgery  . Depression 2011   treated at the time, no longer on meds  . Diabetes mellitus without complication (HCC)    diet controlled  . Headache(784.0)    migraines (rare now)  . Hypertension   . Infertility, female   . Obesity   . PCOS (polycystic ovarian syndrome)   . Shortness of breath    going upstairs, being worked up for [employer]    Past Surgical History:  Procedure Laterality Date  . NASAL SINUS SURGERY N/A 09/15/2012   Procedure: BILATERAL ENDOSCOPIC MAXILLARY ANTROSTOMY/ETHMOIDECTOMY/FRONTAL SINUS SURGERY and sphenoidotomy;  Surgeon: Izora Gala, MD;  Location: [place];  Service: ENT;  Laterality: N/A;  . VIDEO BRONCHOSCOPY Bilateral 05/15/2012   pt styated not completed    Current Outpatient Medications  Medication Sig Dispense Refill  . Blood Glucose Monitoring Suppl (ONE TOUCH ULTRA SYSTEM KIT) W/DEVICE KIT 1 kit by Does not apply route once. 1 each 0  . gemfibrozil (LOPID) 600 MG tablet Take 1 tablet (600 mg total) by mouth 2 (two) times  daily before a meal. 60 tablet 3  . glucose blood test strip 1 each by Other route 3 (three) times daily. Use as instructed 100 each 12  . hydrochlorothiazide (HYDRODIURIL) 12.5 MG tablet Take 1 tablet (12.5 mg total) by mouth daily. 30 tablet 5  . insulin NPH-regular Human (NOVOLIN 70/30) (70-30) 100 UNIT/ML injection Inject into the skin.    . Lancets (ONETOUCH ULTRASOFT) lancets 1 each by Other route 3 (three) times daily. Use as instructed 100 each 12  . lisinopril (PRINIVIL,ZESTRIL) 5 MG tablet Take 1 tablet (5 mg total) by mouth daily. Please disregard previous prescription for 1/2 tab daily. 90 tablet 3   . medroxyPROGESTERone (PROVERA) 5 MG tablet 5 mg x 5 days every other month is no spontaneous menses 15 tablet 1  . metFORMIN (GLUCOPHAGE) 1000 MG tablet Take 1 tablet (1,000 mg total) by mouth 2 (two) times daily with a meal. 60 tablet 6   No current facility-administered medications for this visit.     Family History  Problem Relation Age of Onset  . Hypertension Mother   . Sarcoidosis Brother   . Hypertension Brother   . Uterine cancer Paternal Aunt   . Brain cancer Paternal Grandmother   . Diabetes Paternal Grandmother     Review of Systems  Constitutional: Negative.   HENT: Negative.   Eyes: Negative.   Respiratory: Negative.   Cardiovascular: Negative.   Gastrointestinal: Positive for constipation.  Endocrine: Negative.   Genitourinary: Positive for menstrual problem.       Loss of sexual interest Irregular menstrual cycles   Musculoskeletal: Negative.   Skin: Negative.   Allergic/Immunologic: Negative.   Neurological: Negative.   Psychiatric/Behavioral: Negative.     Exam:   BP (!) 142/80 (BP Location: Right Arm, Patient Position: Sitting, Cuff Size: Large)   Pulse 84   Resp 16   Ht 5' 2.75" (1.594 m)   Wt 233 lb (105.7 kg)   LMP 06/19/2017   BMI 41.60 kg/m   Weight change: '@WEIGHTCHANGE'$ @ Height:   Height: 5' 2.75" (159.4 cm)  Ht Readings from Last 3 Encounters:  06/22/17 5' 2.75" (1.594 m)  09/28/16 '5\' 3"'$  (1.6 m)  06/25/16 '5\' 3"'$  (1.6 m)    General appearance: alert, cooperative and appears stated age Head: Normocephalic, without obvious abnormality, atraumatic Neck: no adenopathy, supple, symmetrical, trachea midline and thyroid normal to inspection and palpation Lungs: clear to auscultation bilaterally Cardiovascular: regular rate and rhythm Breasts: normal appearance, no masses or tenderness Abdomen: soft, non-tender; non distended,  no masses,  no organomegaly Extremities: extremities normal, atraumatic, no cyanosis or edema Skin: Skin color,  texture, turgor normal. No rashes or lesions Lymph nodes: Cervical, supraclavicular, and axillary nodes normal. No abnormal inguinal nodes palpated Neurologic: Grossly normal   Pelvic: External genitalia:  no lesions, some tearing in the posterior fourchette (recent intercourse)              Urethra:  normal appearing urethra with no masses, tenderness or lesions              Bartholins and Skenes: normal                 Vagina: normal appearing vagina with normal color and discharge, no lesions              Cervix: no lesions               Bimanual Exam:  Uterus:  no masses or tenderness  Adnexa: no mass, fullness, tenderness               Rectovaginal: Confirms               Anus:  normal sphincter tone, no lesions  Chaperone was present for exam.  A:  Well Woman with normal exam  Uncontrolled DM  HTN  Depression and anxiety  Dyspareunia  Oligomenorrhea, takes provera as needed    P:   No pap  Screening STD  Needs a new primary, will set up an appointment for her  Start Celexa  Start counseling  Vaginitis probe  F/U in one month  Use lubricant with intercourse, she should control rate and depth of penetration

## 2017-06-23 ENCOUNTER — Telehealth: Payer: Self-pay | Admitting: *Deleted

## 2017-06-23 DIAGNOSIS — Z8639 Personal history of other endocrine, nutritional and metabolic disease: Secondary | ICD-10-CM

## 2017-06-23 DIAGNOSIS — I10 Essential (primary) hypertension: Secondary | ICD-10-CM

## 2017-06-23 LAB — COMPREHENSIVE METABOLIC PANEL
A/G RATIO: 1.1 — AB (ref 1.2–2.2)
ALK PHOS: 142 IU/L — AB (ref 39–117)
ALT: 43 IU/L — ABNORMAL HIGH (ref 0–32)
AST: 43 IU/L — ABNORMAL HIGH (ref 0–40)
Albumin: 4.2 g/dL (ref 3.5–5.5)
BUN / CREAT RATIO: 9 (ref 9–23)
BUN: 9 mg/dL (ref 6–20)
CHLORIDE: 97 mmol/L (ref 96–106)
CO2: 23 mmol/L (ref 20–29)
CREATININE: 0.96 mg/dL (ref 0.57–1.00)
Calcium: 9.7 mg/dL (ref 8.7–10.2)
GFR calc Af Amer: 89 mL/min/{1.73_m2} (ref >59)
GFR calc non Af Amer: 77 mL/min/{1.73_m2} (ref >59)
Globulin, Total: 3.7 g/dL (ref 1.5–4.5)
Glucose: 356 mg/dL — ABNORMAL HIGH (ref 65–99)
Potassium: 4.6 mmol/L (ref 3.5–5.2)
Sodium: 138 mmol/L (ref 134–144)
Total Protein: 7.9 g/dL (ref 6.0–8.5)

## 2017-06-23 LAB — GC/CHLAMYDIA PROBE AMP
Chlamydia trachomatis, NAA: NEGATIVE
NEISSERIA GONORRHOEAE BY PCR: NEGATIVE

## 2017-06-23 LAB — LIPID PANEL
CHOL/HDL RATIO: 5.2 ratio — AB (ref 0.0–4.4)
CHOLESTEROL TOTAL: 167 mg/dL (ref 100–199)
HDL: 32 mg/dL — AB (ref >39)
TRIGLYCERIDES: 465 mg/dL — AB (ref 0–149)

## 2017-06-23 LAB — VAGINITIS/VAGINOSIS, DNA PROBE
CANDIDA SPECIES: NEGATIVE
Gardnerella vaginalis: NEGATIVE
Trichomonas vaginosis: NEGATIVE

## 2017-06-23 LAB — HEP, RPR, HIV PANEL
HIV Screen 4th Generation wRfx: NONREACTIVE
Hepatitis B Surface Ag: NEGATIVE
RPR: NONREACTIVE

## 2017-06-23 LAB — CBC
Hematocrit: 42.1 % (ref 34.0–46.6)
Hemoglobin: 13.8 g/dL (ref 11.1–15.9)
MCH: 30.3 pg (ref 26.6–33.0)
MCHC: 32.8 g/dL (ref 31.5–35.7)
MCV: 92 fL (ref 79–97)
PLATELETS: 275 10*3/uL (ref 150–379)
RBC: 4.56 x10E6/uL (ref 3.77–5.28)
RDW: 13.6 % (ref 12.3–15.4)
WBC: 3.8 10*3/uL (ref 3.4–10.8)

## 2017-06-23 LAB — TSH: TSH: 1.05 u[IU]/mL (ref 0.450–4.500)

## 2017-06-23 LAB — HEMOGLOBIN A1C
ESTIMATED AVERAGE GLUCOSE: 321 mg/dL
Hgb A1c MFr Bld: 12.8 % — ABNORMAL HIGH (ref 4.8–5.6)

## 2017-06-23 LAB — HEPATITIS C ANTIBODY: Hep C Virus Ab: <0.1 s/co ratio (ref 0.0–0.9)

## 2017-06-23 NOTE — Telephone Encounter (Signed)
Spoke with [employer]. Was advised first available appt 6/10. Requested review with provider for earlier appointment, patient hx uncontrolled DM and HTN. Lab results available in Epic. Was advised will forward message to nurse for review and return call.

## 2017-06-23 NOTE — Telephone Encounter (Signed)
Left message to call Noreene Larsson at [phone].   Order placed for referral to Dr. Bobbe Medico

## 2017-06-23 NOTE — Telephone Encounter (Signed)
-----   Message from Romualdo Bolk, MD sent at 06/22/2017  6:01 PM EDT ----- Please set this patient up to see Dr Earlene Plater. She has uncontrolled diabetes and HTN. Needs a new primary, overdue to be seen.

## 2017-06-23 NOTE — Telephone Encounter (Signed)
Please see the result note. If Dr Earlene Plater can't see her, can someone else in the office see her, or can they advise on where to send her? Thanks

## 2017-06-23 NOTE — Telephone Encounter (Addendum)
Spoke with patient regarding referral to [name], advised RN will call to schedule and return call.   Patient requesting lab results dated 06/22/17, advised Dr. Oscar La will review and I will return call to review lab results and appointment info.   Patient verbalizes understanding.    Dr. Oscar La -please review labs and advise.

## 2017-06-24 ENCOUNTER — Telehealth: Payer: Self-pay | Admitting: Obstetrics and Gynecology

## 2017-06-24 LAB — HCG, SERUM, QUALITATIVE: hCG,Beta Subunit,Qual,Serum: NEGATIVE m[IU]/mL (ref <6)

## 2017-06-24 LAB — SPECIMEN STATUS REPORT

## 2017-06-24 NOTE — Telephone Encounter (Signed)
-----   Message from Romualdo Bolk, MD sent at 06/23/2017  5:52 PM EDT ----- Please call the patient and advise her that her diabetes is very out of control (HgbA1C of 12.8), her lipid panel is very abnormal, and her liver function is mildly abnormal. It's very important that she see a primary MD for management. The rest of her lab work was fine.  Please ask Dr Natalia Leatherwood office, if they can see her.

## 2017-06-24 NOTE — Telephone Encounter (Signed)
Spoke with patient, advised as seen below per Dr. Oscar La. Advised patient awaiting return call from [name] office regarding scheduling, will return call with appt details once scheduled. Patient verbalizes understanding and is agreeable.

## 2017-06-24 NOTE — Telephone Encounter (Addendum)
Left voicemail regarding referral appointment. The information is listed below. Should the patient need to cancel or reschedule this appointment, Please advise them to call the office they've been referred to in order to reschedule.  [facility] [address] [place], [place], [place] [address]  Phone: [phone].     Appointment scheduled 06/27/17 @ 1:00 pm. Please arrive 15 minutes early and bring your insurance card and photo id and list of medications.

## 2017-06-24 NOTE — Telephone Encounter (Signed)
Call returned to [facility] at [name] office. Was on hold for 15 min call was disconnected from their line.

## 2017-06-24 NOTE — Telephone Encounter (Signed)
Tanya Hardy at [facility] office is returning your call.

## 2017-06-27 ENCOUNTER — Encounter: Payer: Self-pay | Admitting: Family Medicine

## 2017-06-27 ENCOUNTER — Ambulatory Visit: Payer: 59 | Admitting: Family Medicine

## 2017-06-27 VITALS — BP 144/86 | HR 86 | Temp 99.5°F | Ht 62.75 in | Wt 232.8 lb

## 2017-06-27 DIAGNOSIS — E1169 Type 2 diabetes mellitus with other specified complication: Secondary | ICD-10-CM | POA: Diagnosis not present

## 2017-06-27 DIAGNOSIS — R945 Abnormal results of liver function studies: Secondary | ICD-10-CM

## 2017-06-27 DIAGNOSIS — E119 Type 2 diabetes mellitus without complications: Secondary | ICD-10-CM

## 2017-06-27 DIAGNOSIS — R7989 Other specified abnormal findings of blood chemistry: Secondary | ICD-10-CM

## 2017-06-27 DIAGNOSIS — E782 Mixed hyperlipidemia: Secondary | ICD-10-CM | POA: Diagnosis not present

## 2017-06-27 DIAGNOSIS — I10 Essential (primary) hypertension: Secondary | ICD-10-CM

## 2017-06-27 DIAGNOSIS — E669 Obesity, unspecified: Secondary | ICD-10-CM | POA: Diagnosis not present

## 2017-06-27 DIAGNOSIS — E1165 Type 2 diabetes mellitus with hyperglycemia: Secondary | ICD-10-CM | POA: Diagnosis not present

## 2017-06-27 DIAGNOSIS — E1159 Type 2 diabetes mellitus with other circulatory complications: Secondary | ICD-10-CM

## 2017-06-27 DIAGNOSIS — E1121 Type 2 diabetes mellitus with diabetic nephropathy: Secondary | ICD-10-CM

## 2017-06-27 MED ORDER — SEMAGLUTIDE(0.25 OR 0.5MG/DOS) 2 MG/1.5ML ~~LOC~~ SOPN: 0.5000 mg | PEN_INJECTOR | SUBCUTANEOUS | 0 refills | Status: DC

## 2017-06-27 MED ORDER — SITAGLIPTIN PHOS-METFORMIN HCL 50-500 MG PO TABS: 1.0000 | ORAL_TABLET | Freq: Two times a day (BID) | ORAL | 0 refills | Status: DC

## 2017-06-27 MED ORDER — SEMAGLUTIDE (1 MG/DOSE) 2 MG/1.5ML ~~LOC~~ SOPN: 1.0000 mg | PEN_INJECTOR | SUBCUTANEOUS | 6 refills | Status: DC

## 2017-06-27 MED ORDER — SITAGLIPTIN PHOS-METFORMIN HCL 50-1000 MG PO TABS: 1.0000 | ORAL_TABLET | Freq: Two times a day (BID) | ORAL | 0 refills | Status: DC

## 2017-06-27 NOTE — Telephone Encounter (Signed)
Will close encounter

## 2017-06-27 NOTE — Patient Instructions (Addendum)
Decrease the insulin by 1/2. Check your blood sugar 2-3 times daily while adjusting to the new medications.  Janumet and Ozempic provided today.   We are ordering a sleep study and an ultrasound of your liver.   Follow up in one month to check in on your progress.

## 2017-06-27 NOTE — Progress Notes (Signed)
Tanya Hardy is a 37 y.o. female is here for TO ESTABLISH CARE.  History of Present Illness:   Tanya Hardy CMA acting as scribe for Dr. Juleen Hardy.  HPI: Patient comes in today to establish care. Patient was sent over from Dr. Sumner Boast, GYN.   Diabetes: Patient has been on metformin and insulin. Patient was on the [place] and this helped her A1c go down. She has been on Actos and Glipizide as well. She was on Novalog 70/30 55-60 units daily. This also helped with her A1C numbers come down. She could not afford the Novolog. She has been on insulin for over 10 years. The metformin messed up her stomach so she has not been taking this. We will switch to Metformin ER and back to Janumet.   Morbid obesity: Discussed with the patient about bariatric surgery. She is concerned about doing this. Dr. Juleen Hardy thinks that she would benefit from gastric sleeve.   Fatty Liver: We will ordered an ultrasound today to check her fatty liver.   Health Maintenance Due  Topic Date Due  . OPHTHALMOLOGY EXAM  05/28/2016  . FOOT EXAM  06/25/2017   Depression screen [facility] 2/9 06/27/2017 09/28/2016 06/25/2016  Decreased Interest '3 3 2  '$ Down, Depressed, Hopeless 3 2 0  PHQ - 2 Score '6 5 2  '$ Altered sleeping '3 3 3  '$ Tired, decreased energy '3 3 3  '$ Change in appetite 3 3 0  Feeling bad or failure about yourself  1 0 0  Trouble concentrating 2 3 0  Moving slowly or fidgety/restless 0 0 0  Suicidal thoughts 0 0 0  PHQ-9 Score '18 17 8  '$ Difficult doing work/chores Very difficult - -   PMHx, SurgHx, SocialHx, FamHx, Medications, and Allergies were reviewed in the Visit Navigator and updated as appropriate.   Patient Active Problem List   Diagnosis Date Noted  . Amenorrhea 12/17/2015  . Severe obesity (BMI >= 40) ([place]) 05/29/2015  . Macroalbuminuric diabetic nephropathy ([place]) 10/01/2014  . Secondary amenorrhea 09/30/2014  . PCOS (polycystic ovarian syndrome) 09/30/2014  . Diabetes type 2, uncontrolled ([place])  07/04/2013  . Essential hypertension, benign 07/04/2013  . Hypertriglyceridemia 07/04/2013  . Sarcoidosis 10/06/2012  . Pulmonary infiltrates 09/15/2012  . Chronic rhinitis 04/20/2012  . Lymphadenopathy, mediastinal 03/27/2012   Social History   Tobacco Use  . Smoking status: Former Smoker    Packs/day: 0.25    Years: 11.00    Pack years: 2.75    Types: Cigarettes, E-cigarettes    Last attempt to quit: 04/07/2012    Years since quitting: 5.2  . Smokeless tobacco: Never Used  Substance Use Topics  . Alcohol use: Yes    Alcohol/week: 3.0 oz    Types: 5 Cans of beer per week  . Drug use: No   Current Medications and Allergies:   Current Outpatient Medications:  .  Blood Glucose Monitoring Suppl (ONE TOUCH ULTRA SYSTEM KIT) W/DEVICE KIT, 1 kit by Does not apply route once., Disp: 1 each, Rfl: 0 .  citalopram (CELEXA) 20 MG tablet, Take 1/2 a tablet a day for one week, if tolerating increase to one tablet a day, Disp: 30 tablet, Rfl: 1 .  gemfibrozil (LOPID) 600 MG tablet, Take 1 tablet (600 mg total) by mouth 2 (two) times daily before a meal., Disp: 60 tablet, Rfl: 3 .  glucose blood test strip, 1 each by Other route 3 (three) times daily. Use as instructed, Disp: 100 each, Rfl: 12 .  hydrochlorothiazide (HYDRODIURIL)  12.5 MG tablet, Take 1 tablet (12.5 mg total) by mouth daily., Disp: 30 tablet, Rfl: 5 .  insulin NPH-regular Human (NOVOLIN 70/30) (70-30) 100 UNIT/ML injection, Inject into the skin., Disp: , Rfl:  .  Lancets (ONETOUCH ULTRASOFT) lancets, 1 each by Other route 3 (three) times daily. Use as instructed, Disp: 100 each, Rfl: 12 .  lisinopril (PRINIVIL,ZESTRIL) 5 MG tablet, Take 1 tablet (5 mg total) by mouth daily. Please disregard previous prescription for 1/2 tab daily., Disp: 90 tablet, Rfl: 3 .  medroxyPROGESTERone (PROVERA) 5 MG tablet, 5 mg x 5 days every other month is no spontaneous menses, Disp: 15 tablet, Rfl: 1 .  metFORMIN (GLUCOPHAGE) 1000 MG tablet, Take 1  tablet (1,000 mg total) by mouth 2 (two) times daily with a meal., Disp: 60 tablet, Rfl: 6   Allergies  Allergen Reactions  . Dexamethasone Sodium Phosphate Other (See Comments)    Pt states after receiving injection had severe nosebleeds, bruising, blood blister on lip   Review of Systems   Pertinent items are noted in the HPI. Otherwise, ROS is negative.  Vitals:   Vitals:   06/27/17 0852  BP: (!) 144/86  Pulse: 86  Temp: 99.5 F (37.5 C)  TempSrc: Oral  SpO2: 97%  Weight: 232 lb 12.8 oz (105.6 kg)  Height: 5' 2.75" (1.594 m)     Body mass index is 41.57 kg/m.  Physical Exam:   Physical Exam  Constitutional: She is oriented to person, place, and time. She appears well-developed and well-nourished. No distress.  HENT:  Head: Normocephalic and atraumatic.  Right Ear: External ear normal.  Left Ear: External ear normal.  Nose: Nose normal.  Mouth/Throat: Oropharynx is clear and moist.  Eyes: Pupils are equal, round, and reactive to light. Conjunctivae and EOM are normal.  Neck: Normal range of motion. Neck supple. No thyromegaly present.  Cardiovascular: Normal rate, regular rhythm, normal heart sounds and intact distal pulses.  Pulmonary/Chest: Effort normal and breath sounds normal.  Abdominal: Soft. Bowel sounds are normal.  Musculoskeletal: Normal range of motion.  Lymphadenopathy:    She has no cervical adenopathy.  Neurological: She is alert and oriented to person, place, and time.  Skin: Skin is warm and dry. Capillary refill takes less than 2 seconds.  Psychiatric: She has a normal mood and affect. Her behavior is normal.  Nursing note and vitals reviewed.   Diabetic Foot Exam - Simple   Simple Foot Form Diabetic Foot exam was performed with the following findings:  Yes 07/03/2017 10:07 AM  Visual Inspection No deformities, no ulcerations, no other skin breakdown bilaterally:  Yes Sensation Testing Intact to touch and monofilament testing bilaterally:   Yes Pulse Check Posterior Tibialis and Dorsalis pulse intact bilaterally:  Yes Comments     Assessment and Plan:   [name] was seen today for establish care.  Diagnoses and all orders for this visit:  Uncontrolled type 2 diabetes mellitus with hyperglycemia ([place]) Comments: Adjusted medications today.  Goal will be to control with weight loss, metformin extended, and a GLP-1 receptor agonist.  See AVS.  Elevated LFTs Comments: Elevated liver function tests are likely due to fatty liver.  Will order liver ultrasound to evaluate. Orders: -     US LIVER DOPPLER; Future  Obesity, diabetes, and hypertension syndrome ([place]) Comments: Reviewed weight loss for medical management of this issue.  We will be mindful that the patient is still considering pregnancy in the future. Orders: -     sitaGLIPtin-metformin (JANUMET)  50-1000 MG tablet; Take 1 tablet by mouth 2 (two) times daily with a meal. -     Semaglutide (OZEMPIC) 1 MG/DOSE SOPN; Inject 1 mg into the skin once a week. -     Semaglutide (OZEMPIC) 0.25 or 0.5 MG/DOSE SOPN; Inject 0.5 mg into the skin once a week. -     sitaGLIPtin-metformin (JANUMET) 50-500 MG tablet; Take 1 tablet by mouth 2 (two) times daily with a meal. -     sitaGLIPtin-metformin (JANUMET) 50-1000 MG tablet; Take 1 tablet by mouth 2 (two) times daily with a meal.  Mixed diabetic hyperlipidemia associated with type 2 diabetes mellitus ([place]) Comments: We will work on controlling diabetes for management of this issue.  She is on Lopid at the moment.  Will clarify why at the next visit.  Consider statin.  Macroalbuminuric diabetic nephropathy ([place]) Comments: Patient is on ACE inhibitor.  Severe obesity (BMI >= 40) (HCC) Comments: Reviewed weight loss options, including bariatric surgery.   . Reviewed expectations re: course of current medical issues. . Discussed self-management of symptoms. . Outlined signs and symptoms indicating need for more acute  intervention. . Patient verbalized understanding and all questions were answered. Marland Kitchen Health Maintenance issues including appropriate healthy diet, exercise, and smoking avoidance were discussed with patient. . See orders for this visit as documented in the electronic medical record. . Patient received an After Visit Summary.  Briscoe Deutscher, DO [place] 07/03/2017  Future Appointments  Date Time Provider Department Center  07/26/2017  8:15 AM Salvadore Dom, MD [place] None  07/29/2017  7:20 AM Briscoe Deutscher, DO [facility]  07/19/2018  8:00 AM Salvadore Dom, MD [place] None   CMA served as scribe during this visit. History, Physical, and Plan performed by medical provider. The above documentation has been reviewed and is accurate and complete. Briscoe Deutscher, D.O.

## 2017-06-27 NOTE — Telephone Encounter (Signed)
Patient called to let Dr. Oscar La know she went to see [name]. Patient further said, "I love [name]! Thank you so much for the referral. I just wanted Dr. Oscar La to know I'm happy and getting the care I need."  Cc: Triage for FYI.

## 2017-07-03 ENCOUNTER — Encounter: Payer: Self-pay | Admitting: Family Medicine

## 2017-07-26 ENCOUNTER — Other Ambulatory Visit: Payer: Self-pay

## 2017-07-26 ENCOUNTER — Encounter: Payer: Self-pay | Admitting: Obstetrics and Gynecology

## 2017-07-26 ENCOUNTER — Ambulatory Visit (INDEPENDENT_AMBULATORY_CARE_PROVIDER_SITE_OTHER): Payer: 59 | Admitting: Obstetrics and Gynecology

## 2017-07-26 VITALS — BP 142/98 | HR 88 | Resp 16 | Wt 224.0 lb

## 2017-07-26 DIAGNOSIS — F32A Depression, unspecified: Secondary | ICD-10-CM

## 2017-07-26 DIAGNOSIS — F329 Major depressive disorder, single episode, unspecified: Secondary | ICD-10-CM | POA: Diagnosis not present

## 2017-07-26 MED ORDER — CITALOPRAM HYDROBROMIDE 20 MG PO TABS: ORAL_TABLET | ORAL | 3 refills | Status: DC

## 2017-07-26 NOTE — Progress Notes (Signed)
GYNECOLOGY  VISIT   HPI: 37 y.o.   Married  [place] American  female   [place] with Patient's last menstrual period was 07/19/2017.   here for follow up on depression. She was started on Celexa at her annual exam last month. Her depression is gone. Her only complaint is a slight intermittent headache, seemed to have started around the time she started the Celexa. Overall very happy with the Celexa, feeling so much better. She is getting out of the house, spending time with friends. She and her husband are basically separated, but still living together. Not sure what will happen.  She has good support.      She has established care with Dr Juleen China, is on new medication, feeling better, has lost 9 lbs in one month.  GYNECOLOGIC HISTORY: Patient's last menstrual period was 07/19/2017. Contraception:none  Menopausal hormone therapy: none         OB History    Gravida  1   Para      Term      Preterm      AB  1   Living        SAB  1   TAB      Ectopic      Multiple      Live Births                 Patient Active Problem List   Diagnosis Date Noted  . Amenorrhea 12/17/2015  . Severe obesity (BMI >= 40) ([place]) 05/29/2015  . Macroalbuminuric diabetic nephropathy ([place]) 10/01/2014  . Secondary amenorrhea 09/30/2014  . PCOS (polycystic ovarian syndrome) 09/30/2014  . Diabetes type 2, uncontrolled ([place]) 07/04/2013  . Essential hypertension, benign 07/04/2013  . Hypertriglyceridemia 07/04/2013  . Sarcoidosis 10/06/2012  . Pulmonary infiltrates 09/15/2012  . Chronic rhinitis 04/20/2012  . Lymphadenopathy, mediastinal 03/27/2012    Past Medical History:  Diagnosis Date  . Amenorrhea   . Anxiety   . Chronic sinusitis   . Complication of anesthesia    anesthesia not strong enough, fought during surgery  . Depression 2011   treated at the time, no longer on meds  . Diabetes mellitus without complication (HCC)    diet controlled  . Headache(784.0)    migraines  (rare now)  . Hypertension   . Infertility, female   . Obesity   . PCOS (polycystic ovarian syndrome)   . Shortness of breath    going upstairs, being worked up for [place]    Past Surgical History:  Procedure Laterality Date  . NASAL SINUS SURGERY N/A 09/15/2012   Procedure: BILATERAL ENDOSCOPIC MAXILLARY ANTROSTOMY/ETHMOIDECTOMY/FRONTAL SINUS SURGERY and sphenoidotomy;  Surgeon: Izora Gala, MD;  Location: [facility];  Service: ENT;  Laterality: N/A;  . VIDEO BRONCHOSCOPY Bilateral 05/15/2012   pt styated not completed    Current Outpatient Medications  Medication Sig Dispense Refill  . Blood Glucose Monitoring Suppl (ONE TOUCH ULTRA SYSTEM KIT) W/DEVICE KIT 1 kit by Does not apply route once. 1 each 0  . citalopram (CELEXA) 20 MG tablet Take 1/2 a tablet a day for one week, if tolerating increase to one tablet a day 30 tablet 1  . gemfibrozil (LOPID) 600 MG tablet Take 1 tablet (600 mg total) by mouth 2 (two) times daily before a meal. 60 tablet 3  . glucose blood test strip 1 each by Other route 3 (three) times daily. Use as instructed 100 each 12  . Lancets (ONETOUCH ULTRASOFT) lancets 1 each by Other route  3 (three) times daily. Use as instructed 100 each 12  . lisinopril (PRINIVIL,ZESTRIL) 5 MG tablet Take 1 tablet (5 mg total) by mouth daily. Please disregard previous prescription for 1/2 tab daily. 90 tablet 3  . medroxyPROGESTERone (PROVERA) 5 MG tablet 5 mg x 5 days every other month is no spontaneous menses 15 tablet 1  . Semaglutide (OZEMPIC) 1 MG/DOSE SOPN Inject 1 mg into the skin once a week. 1.5 mL 6  . sitaGLIPtin-metformin (JANUMET) 50-1000 MG tablet Take 1 tablet by mouth 2 (two) times daily with a meal. 30 tablet 0  . hydrochlorothiazide (HYDRODIURIL) 12.5 MG tablet Take 1 tablet (12.5 mg total) by mouth daily. (Patient not taking: Reported on 07/26/2017) 30 tablet 5   No current facility-administered medications for this visit.      ALLERGIES: Dexamethasone sodium  phosphate  Family History  Problem Relation Age of Onset  . Hypertension Mother   . Sarcoidosis Brother   . Hypertension Brother   . Uterine cancer Paternal Aunt   . Brain cancer Paternal Grandmother   . Diabetes Paternal Grandmother     Social History   Socioeconomic History  . Marital status: Married    Spouse name: Not on file  . Number of children: 0  . Years of education: Not on file  . Highest education level: Not on file  Occupational History  . Occupation: Works at a [employer]  . Financial resource strain: Not on file  . Food insecurity:    Worry: Not on file    Inability: Not on file  . Transportation needs:    Medical: Not on file    Non-medical: Not on file  Tobacco Use  . Smoking status: Former Smoker    Packs/day: 0.25    Years: 11.00    Pack years: 2.75    Types: Cigarettes, E-cigarettes    Last attempt to quit: 04/07/2012    Years since quitting: 5.3  . Smokeless tobacco: Never Used  Substance and Sexual Activity  . Alcohol use: Yes    Alcohol/week: 3.0 oz    Types: 5 Cans of beer per week  . Drug use: No  . Sexual activity: Yes    Partners: Male    Birth control/protection: None  Lifestyle  . Physical activity:    Days per week: Not on file    Minutes per session: Not on file  . Stress: Not on file  Relationships  . Social connections:    Talks on phone: Not on file    Gets together: Not on file    Attends religious service: Not on file    Active member of club or organization: Not on file    Attends meetings of clubs or organizations: Not on file    Relationship status: Not on file  . Intimate partner violence:    Fear of current or ex partner: Not on file    Emotionally abused: Not on file    Physically abused: Not on file    Forced sexual activity: Not on file  Other Topics Concern  . Not on file  Social History Narrative  . Not on file    Review of Systems  Constitutional: Negative.   HENT: Negative.   Eyes:  Negative.   Respiratory: Negative.   Cardiovascular: Negative.   Gastrointestinal: Negative.   Genitourinary: Negative.   Musculoskeletal: Negative.   Skin: Negative.   Neurological: Negative.   Endo/Heme/Allergies: Negative.   Psychiatric/Behavioral: Negative.  PHYSICAL EXAMINATION:    BP (!) 142/98 (BP Location: Right Arm, Patient Position: Sitting, Cuff Size: Normal)   Pulse 88   Resp 16   Wt 224 lb (101.6 kg)   LMP 07/19/2017   BMI 40.00 kg/m     General appearance: alert, cooperative and appears stated age   ASSESSMENT Depression, doing great on the Celexa BP slightly elevated. She is f/u with Dr Juleen China later this week for DM and HTN.     PLAN Continue Celexa Routine f/u   An After Visit Summary was printed and given to the patient.

## 2017-07-28 DIAGNOSIS — E1159 Type 2 diabetes mellitus with other circulatory complications: Secondary | ICD-10-CM | POA: Insufficient documentation

## 2017-07-28 DIAGNOSIS — R945 Abnormal results of liver function studies: Secondary | ICD-10-CM

## 2017-07-28 DIAGNOSIS — I10 Essential (primary) hypertension: Secondary | ICD-10-CM

## 2017-07-28 DIAGNOSIS — E782 Mixed hyperlipidemia: Secondary | ICD-10-CM

## 2017-07-28 DIAGNOSIS — R7989 Other specified abnormal findings of blood chemistry: Secondary | ICD-10-CM | POA: Insufficient documentation

## 2017-07-28 DIAGNOSIS — F4321 Adjustment disorder with depressed mood: Secondary | ICD-10-CM | POA: Insufficient documentation

## 2017-07-28 DIAGNOSIS — E1169 Type 2 diabetes mellitus with other specified complication: Secondary | ICD-10-CM | POA: Insufficient documentation

## 2017-07-28 DIAGNOSIS — E669 Obesity, unspecified: Secondary | ICD-10-CM | POA: Insufficient documentation

## 2017-07-28 DIAGNOSIS — I152 Hypertension secondary to endocrine disorders: Secondary | ICD-10-CM | POA: Insufficient documentation

## 2017-07-28 NOTE — Progress Notes (Signed)
Tanya Hardy is a 37 y.o. female is here for follow up.  History of Present Illness:   Tanya Hardy CMA acting as scribe for Dr. Juleen Hardy.  HPI: Patient comes in today for her follow up on her weight check. Patient is tolerating medication. Patient continues to lose weight. She has lost 4 more pounds since her last visit. Doing well with medication.   Hypertension: Patient is on Lisinopril. She has not been taking HCTZ. Her blood pressure is in normal range today at 126/88. She is tolerating medication.    Depression: Improved with Celexa.  Health Maintenance Due  Topic Date Due  . OPHTHALMOLOGY EXAM  05/28/2016   Depression screen [facility] 2/9 06/27/2017 09/28/2016 06/25/2016  Decreased Interest '3 3 2  '$ Down, Depressed, Hopeless 3 2 0  PHQ - 2 Score '6 5 2  '$ Altered sleeping '3 3 3  '$ Tired, decreased energy '3 3 3  '$ Change in appetite 3 3 0  Feeling bad or failure about yourself  1 0 0  Trouble concentrating 2 3 0  Moving slowly or fidgety/restless 0 0 0  Suicidal thoughts 0 0 0  PHQ-9 Score '18 17 8  '$ Difficult doing work/chores Very difficult - -   PMHx, SurgHx, SocialHx, FamHx, Medications, and Allergies were reviewed in the Visit Navigator and updated as appropriate.   Patient Active Problem List   Diagnosis Date Noted  . Situational depression, on Celexa 07/28/2017  . Mixed diabetic hyperlipidemia associated with type 2 diabetes mellitus ([place]) 07/28/2017  . Obesity, diabetes, and hypertension syndrome ([place]) 07/28/2017  . Elevated LFTs 07/28/2017  . Amenorrhea 12/17/2015  . Severe obesity (BMI >= 40) ([place]) 05/29/2015  . Macroalbuminuric diabetic nephropathy ([place]) 10/01/2014  . Secondary amenorrhea 09/30/2014  . PCOS (polycystic ovarian syndrome) 09/30/2014  . Diabetes type 2, uncontrolled ([place]) 07/04/2013  . Essential hypertension, benign 07/04/2013  . Sarcoidosis 10/06/2012  . Pulmonary infiltrates 09/15/2012  . Chronic rhinitis 04/20/2012  . Lymphadenopathy, mediastinal  03/27/2012   Social History   Tobacco Use  . Smoking status: Former Smoker    Packs/day: 0.25    Years: 11.00    Pack years: 2.75    Types: Cigarettes, E-cigarettes    Last attempt to quit: 04/07/2012    Years since quitting: 5.3  . Smokeless tobacco: Never Used  Substance Use Topics  . Alcohol use: Yes    Alcohol/week: 3.0 oz    Types: 5 Cans of beer per week  . Drug use: No   Current Medications and Allergies:   Current Outpatient Medications:  .  Blood Glucose Monitoring Suppl (ONE TOUCH ULTRA SYSTEM KIT) W/DEVICE KIT, 1 kit by Does not apply route once., Disp: 1 each, Rfl: 0 .  citalopram (CELEXA) 20 MG tablet, one tablet a day, Disp: 90 tablet, Rfl: 3 .  gemfibrozil (LOPID) 600 MG tablet, Take 1 tablet (600 mg total) by mouth 2 (two) times daily before a meal., Disp: 60 tablet, Rfl: 3 .  glucose blood test strip, 1 each by Other route 3 (three) times daily. Use as instructed, Disp: 100 each, Rfl: 12 .  hydrochlorothiazide (HYDRODIURIL) 12.5 MG tablet, Take 1 tablet (12.5 mg total) by mouth daily., Disp: 30 tablet, Rfl: 5 .  Lancets (ONETOUCH ULTRASOFT) lancets, 1 each by Other route 3 (three) times daily. Use as instructed, Disp: 100 each, Rfl: 12 .  lisinopril (PRINIVIL,ZESTRIL) 5 MG tablet, Take 1 tablet (5 mg total) by mouth daily. Please disregard previous prescription for 1/2 tab daily., Disp: 90  tablet, Rfl: 3 .  medroxyPROGESTERone (PROVERA) 5 MG tablet, 5 mg x 5 days every other month is no spontaneous menses, Disp: 15 tablet, Rfl: 1 .  Semaglutide (OZEMPIC) 1 MG/DOSE SOPN, Inject 1 mg into the skin once a week., Disp: 1.5 mL, Rfl: 6   Allergies  Allergen Reactions  . Dexamethasone Sodium Phosphate Other (See Comments)    Pt states after receiving injection had severe nosebleeds, bruising, blood blister on lip   Review of Systems   Pertinent items are noted in the HPI. Otherwise, ROS is negative.  Vitals:   Vitals:   07/29/17 0722  BP: 126/88  Pulse: 96    Temp: 98.5 F (36.9 C)  TempSrc: Oral  SpO2: 100%  Weight: 221 lb 9.6 oz (100.5 kg)  Height: 5' 2.75" (1.594 m)     Body mass index is 39.57 kg/m.  Physical Exam:   Physical Exam  Constitutional: She is oriented to person, place, and time. She appears well-developed and well-nourished. No distress.  HENT:  Head: Normocephalic and atraumatic.  Right Ear: External ear normal.  Left Ear: External ear normal.  Nose: Nose normal.  Mouth/Throat: Oropharynx is clear and moist.  Eyes: Pupils are equal, round, and reactive to light. Conjunctivae and EOM are normal.  Neck: Normal range of motion. Neck supple. No thyromegaly present.  Cardiovascular: Normal rate, regular rhythm, normal heart sounds and intact distal pulses.  Pulmonary/Chest: Effort normal and breath sounds normal.  Abdominal: Soft. Bowel sounds are normal.  Musculoskeletal: Normal range of motion.  Lymphadenopathy:    She has no cervical adenopathy.  Neurological: She is alert and oriented to person, place, and time.  Skin: Skin is warm and dry. Capillary refill takes less than 2 seconds.  Psychiatric: She has a normal mood and affect. Her behavior is normal.  Nursing note and vitals reviewed.  Assessment and Plan:   Tanya Hardy was seen today for weight check.  Diagnoses and all orders for this visit:  Uncontrolled type 2 diabetes mellitus with hyperglycemia ([place]) Comments: She is doing very well. AM BG 120. Will DC Janumet due to cost and diarrhea. Increase Ozempic to 1 mg weekly.  PCOS (polycystic ovarian syndrome)  Severe obesity (BMI >= 40) (HCC) Comments: Down 69 pounds total so far. Will meet again in 2 months. Will consider phentermine if needed.  Mixed diabetic hyperlipidemia associated with type 2 diabetes mellitus ([place]) Comments: On Lopid. Will change to statin at future visit.   Macroalbuminuric diabetic nephropathy ([place]), on Lisinopril Comments: Continue current treatment.  Obesity, diabetes,  and hypertension syndrome (HCC)  Elevated LFTs Comments: Liver US pending.   Situational depression, controlled with Celexa Comments: Doing well. Continue current treatment. PHQ9 at next visit.   Essential hypertension, benign -     hydrochlorothiazide (HYDRODIURIL) 12.5 MG tablet; Take 1 tablet (12.5 mg total) by mouth daily.  Need for prophylactic vaccination against Streptococcus pneumoniae (pneumococcus) -     Pneumococcal polysaccharide vaccine 23-valent greater than or equal to 2yo subcutaneous/IM; Future -     Pneumococcal polysaccharide vaccine 23-valent greater than or equal to 2yo subcutaneous/IM   . Reviewed expectations re: course of current medical issues. . Discussed self-management of symptoms. . Outlined signs and symptoms indicating need for more acute intervention. . Patient verbalized understanding and all questions were answered. Marland Kitchen Health Maintenance issues including appropriate healthy diet, exercise, and smoking avoidance were discussed with patient. . See orders for this visit as documented in the electronic medical record. . Patient received  an After Visit Summary.  Briscoe Deutscher, DO [place] 07/31/2017  Future Appointments  Date Time Provider [place]  09/28/2017  8:40 AM Briscoe Deutscher, DO [facility]  07/19/2018  8:00 AM Salvadore Dom, MD [place] None    CMA served as scribe during this visit. History, Physical, and Plan performed by medical provider. The above documentation has been reviewed and is accurate and complete. Briscoe Deutscher, D.O.

## 2017-07-29 ENCOUNTER — Ambulatory Visit: Payer: 59 | Admitting: Family Medicine

## 2017-07-29 ENCOUNTER — Encounter: Payer: Self-pay | Admitting: Family Medicine

## 2017-07-29 VITALS — BP 126/88 | HR 96 | Temp 98.5°F | Ht 62.75 in | Wt 221.6 lb

## 2017-07-29 DIAGNOSIS — E1165 Type 2 diabetes mellitus with hyperglycemia: Secondary | ICD-10-CM

## 2017-07-29 DIAGNOSIS — E1121 Type 2 diabetes mellitus with diabetic nephropathy: Secondary | ICD-10-CM | POA: Diagnosis not present

## 2017-07-29 DIAGNOSIS — E669 Obesity, unspecified: Secondary | ICD-10-CM | POA: Diagnosis not present

## 2017-07-29 DIAGNOSIS — Z23 Encounter for immunization: Secondary | ICD-10-CM

## 2017-07-29 DIAGNOSIS — E119 Type 2 diabetes mellitus without complications: Secondary | ICD-10-CM

## 2017-07-29 DIAGNOSIS — F4321 Adjustment disorder with depressed mood: Secondary | ICD-10-CM

## 2017-07-29 DIAGNOSIS — I152 Hypertension secondary to endocrine disorders: Secondary | ICD-10-CM

## 2017-07-29 DIAGNOSIS — R7989 Other specified abnormal findings of blood chemistry: Secondary | ICD-10-CM

## 2017-07-29 DIAGNOSIS — E782 Mixed hyperlipidemia: Secondary | ICD-10-CM | POA: Diagnosis not present

## 2017-07-29 DIAGNOSIS — I10 Essential (primary) hypertension: Secondary | ICD-10-CM

## 2017-07-29 DIAGNOSIS — R945 Abnormal results of liver function studies: Secondary | ICD-10-CM | POA: Diagnosis not present

## 2017-07-29 DIAGNOSIS — E282 Polycystic ovarian syndrome: Secondary | ICD-10-CM

## 2017-07-29 DIAGNOSIS — E1159 Type 2 diabetes mellitus with other circulatory complications: Secondary | ICD-10-CM

## 2017-07-29 DIAGNOSIS — E1169 Type 2 diabetes mellitus with other specified complication: Secondary | ICD-10-CM | POA: Diagnosis not present

## 2017-07-29 MED ORDER — CITALOPRAM HYDROBROMIDE 20 MG PO TABS: ORAL_TABLET | ORAL | 3 refills | Status: DC

## 2017-07-29 MED ORDER — HYDROCHLOROTHIAZIDE 12.5 MG PO TABS: 12.5000 mg | ORAL_TABLET | Freq: Every day | ORAL | 5 refills | Status: DC

## 2017-07-29 NOTE — Patient Instructions (Signed)
YOU ARE DOWN 69 POUNDS TOTAL!  STOP THE JANUMET. INCREASE THE OZEMPIC TO 1 MG WEEKLY.  AT THE NEXT VISIT, WE WILL CHECK YOU A1C, CHANGE LOPID TO A STATIN, AND CONSIDER CHANGING YOUR WEIGHT LOSS REGIMEN.   I CAN'T WAIT TO SEE YOU AGAIN!

## 2017-08-18 NOTE — Telephone Encounter (Signed)
Made in error please disregard.

## 2017-09-15 ENCOUNTER — Telehealth: Payer: Self-pay | Admitting: Family Medicine

## 2017-09-15 NOTE — Telephone Encounter (Signed)
error 

## 2017-09-16 ENCOUNTER — Other Ambulatory Visit: Payer: Self-pay | Admitting: Surgical

## 2017-09-19 ENCOUNTER — Other Ambulatory Visit: Payer: Self-pay | Admitting: Surgical

## 2017-09-19 DIAGNOSIS — R945 Abnormal results of liver function studies: Principal | ICD-10-CM

## 2017-09-19 DIAGNOSIS — R7989 Other specified abnormal findings of blood chemistry: Secondary | ICD-10-CM

## 2017-09-28 ENCOUNTER — Ambulatory Visit: Payer: 59 | Admitting: Family Medicine

## 2017-09-28 ENCOUNTER — Encounter: Payer: Self-pay | Admitting: Family Medicine

## 2017-09-28 VITALS — BP 126/88 | HR 96 | Temp 98.6°F | Ht 62.75 in | Wt 212.4 lb

## 2017-09-28 DIAGNOSIS — E1169 Type 2 diabetes mellitus with other specified complication: Secondary | ICD-10-CM

## 2017-09-28 DIAGNOSIS — R945 Abnormal results of liver function studies: Secondary | ICD-10-CM | POA: Diagnosis not present

## 2017-09-28 DIAGNOSIS — L923 Foreign body granuloma of the skin and subcutaneous tissue: Secondary | ICD-10-CM

## 2017-09-28 DIAGNOSIS — D869 Sarcoidosis, unspecified: Secondary | ICD-10-CM | POA: Diagnosis not present

## 2017-09-28 DIAGNOSIS — F325 Major depressive disorder, single episode, in full remission: Secondary | ICD-10-CM

## 2017-09-28 DIAGNOSIS — E785 Hyperlipidemia, unspecified: Secondary | ICD-10-CM

## 2017-09-28 DIAGNOSIS — E1165 Type 2 diabetes mellitus with hyperglycemia: Secondary | ICD-10-CM

## 2017-09-28 DIAGNOSIS — R7989 Other specified abnormal findings of blood chemistry: Secondary | ICD-10-CM

## 2017-09-28 DIAGNOSIS — E282 Polycystic ovarian syndrome: Secondary | ICD-10-CM | POA: Diagnosis not present

## 2017-09-28 LAB — LIPID PANEL
Cholesterol: 145 mg/dL (ref 0–200)
HDL: 29.7 mg/dL — ABNORMAL LOW (ref >39.00)
NonHDL: 115.61
Total CHOL/HDL Ratio: 5
Triglycerides: 287 mg/dL — ABNORMAL HIGH (ref 0.0–149.0)
VLDL: 57.4 mg/dL — ABNORMAL HIGH (ref 0.0–40.0)

## 2017-09-28 LAB — COMPREHENSIVE METABOLIC PANEL
ALT: 21 U/L (ref 0–35)
AST: 27 U/L (ref 0–37)
Albumin: 3.9 g/dL (ref 3.5–5.2)
Alkaline Phosphatase: 87 U/L (ref 39–117)
BUN: 13 mg/dL (ref 6–23)
CO2: 29 mEq/L (ref 19–32)
Calcium: 10.4 mg/dL (ref 8.4–10.5)
Chloride: 97 mEq/L (ref 96–112)
Creatinine, Ser: 1.08 mg/dL (ref 0.40–1.20)
GFR: 74.36 mL/min (ref >60.00)
Glucose, Bld: 129 mg/dL — ABNORMAL HIGH (ref 70–99)
Potassium: 4.2 mEq/L (ref 3.5–5.1)
Sodium: 136 mEq/L (ref 135–145)
Total Bilirubin: 0.5 mg/dL (ref 0.2–1.2)
Total Protein: 8 g/dL (ref 6.0–8.3)

## 2017-09-28 LAB — CBC WITH DIFFERENTIAL/PLATELET
Basophils Absolute: 0 10*3/uL (ref 0.0–0.1)
Basophils Relative: 0.7 % (ref 0.0–3.0)
Eosinophils Absolute: 0.1 10*3/uL (ref 0.0–0.7)
Eosinophils Relative: 2.5 % (ref 0.0–5.0)
HCT: 40.2 % (ref 36.0–46.0)
Hemoglobin: 13.4 g/dL (ref 12.0–15.0)
Lymphocytes Relative: 21.4 % (ref 12.0–46.0)
Lymphs Abs: 0.8 10*3/uL (ref 0.7–4.0)
MCHC: 33.4 g/dL (ref 30.0–36.0)
MCV: 86.9 fl (ref 78.0–100.0)
Monocytes Absolute: 0.7 10*3/uL (ref 0.1–1.0)
Monocytes Relative: 20 % — ABNORMAL HIGH (ref 3.0–12.0)
Neutro Abs: 2 10*3/uL (ref 1.4–7.7)
Neutrophils Relative %: 55.4 % (ref 43.0–77.0)
Platelets: 270 10*3/uL (ref 150.0–400.0)
RBC: 4.63 Mil/uL (ref 3.87–5.11)
RDW: 13.5 % (ref 11.5–15.5)
WBC: 3.5 10*3/uL — ABNORMAL LOW (ref 4.0–10.5)

## 2017-09-28 LAB — POCT GLYCOSYLATED HEMOGLOBIN (HGB A1C): Hemoglobin A1C: 7.9 % — AB (ref 4.0–5.6)

## 2017-09-28 LAB — SEDIMENTATION RATE: Sed Rate: 110 mm/hr — ABNORMAL HIGH (ref 0–20)

## 2017-09-28 LAB — LDL CHOLESTEROL, DIRECT: Direct LDL: 79 mg/dL

## 2017-09-28 LAB — C-REACTIVE PROTEIN: CRP: 2.7 mg/dL (ref 0.5–20.0)

## 2017-09-28 MED ORDER — TRIAMCINOLONE ACETONIDE 0.025 % EX OINT: 1.0000 "application " | TOPICAL_OINTMENT | Freq: Two times a day (BID) | CUTANEOUS | 0 refills | Status: DC

## 2017-09-28 NOTE — Patient Instructions (Signed)
You are doing AMAZING!!! Hope you baby feels better soon.

## 2017-09-28 NOTE — Progress Notes (Signed)
Tanya DillsLatasha Hardy is a 37 y.o. female is here for follow up.  History of Present Illness:   Tanya Hardy, CMA acting as scribe for Dr. Helane RimaErica Matisyn Hardy.   HPI: Patient in office for follow up. She lost 9 another pounds after working on diet and starting Ozempic. She has had much lower blood sugar readings. A1C will be checked in office today. She noticed that her tattoos have been reacting - itchy and raised.This started when she started getting her blood sugars under control.    Health Maintenance Due  Topic Date Due  . OPHTHALMOLOGY EXAM  05/28/2016  . INFLUENZA VACCINE  09/08/2017  . URINE MICROALBUMIN  09/28/2017   [facility] 06/27/2017 09/28/2016 06/25/2016  Decreased Interest 3 3 2   Down, Depressed, Hopeless 3 2 0  PHQ - 2 Score 6 5 2   Altered sleeping 3 3 3   Tired, decreased energy 3 3 3   Change in appetite 3 3 0  Feeling bad or failure about yourself  1 0 0  Trouble concentrating 2 3 0  Moving slowly or fidgety/restless 0 0 0  Suicidal thoughts 0 0 0  PHQ-9 Score 18 17 8   Difficult doing work/chores Very difficult - -   PMHx, SurgHx, SocialHx, FamHx, Medications, and Allergies were reviewed in the Visit Navigator and updated as appropriate.   Patient Active Problem List   Diagnosis Date Noted  . Mixed diabetic hyperlipidemia associated with type 2 diabetes mellitus (HCC) 07/28/2017  . Obesity, diabetes, and hypertension syndrome (HCC) 07/28/2017  . Elevated LFTs 07/28/2017  . Macroalbuminuric diabetic nephropathy (HCC) 10/01/2014  . Secondary amenorrhea 09/30/2014  . PCOS (polycystic ovarian syndrome) 09/30/2014  . Diabetes type 2, uncontrolled (HCC) 07/04/2013  . Essential hypertension, benign 07/04/2013  . Sarcoidosis 10/06/2012  . Pulmonary infiltrates 09/15/2012  . Lymphadenopathy, mediastinal 03/27/2012   Social History   Tobacco Use  . Smoking status: Former Smoker    Packs/day: 0.25    Years: 11.00    Pack years: 2.75    Types:  Cigarettes, E-cigarettes    Last attempt to quit: 04/07/2012    Years since quitting: 5.4  . Smokeless tobacco: Never Used  Substance Use Topics  . Alcohol use: Yes    Alcohol/week: 5.0 standard drinks    Types: 5 Cans of beer per week  . Drug use: No   Current Medications and Allergies:   .  citalopram (CELEXA) 20 MG tablet, one tablet a day, Disp: 90 tablet, Rfl: 3 .  gemfibrozil (LOPID) 600 MG tablet, Take 1 tablet (600 mg total) by mouth 2 (two) times daily before a meal., Disp: 60 tablet, Rfl: 3 .  hydrochlorothiazide (HYDRODIURIL) 12.5 MG tablet, Take 1 tablet (12.5 mg total) by mouth daily., Disp: 30 tablet, Rfl: 5 .  lisinopril (PRINIVIL,ZESTRIL) 5 MG tablet, Take 1 tablet (5 mg total) by mouth daily. Please disregard previous prescription for 1/2 tab daily., Disp: 90 tablet, Rfl: 3 .  medroxyPROGESTERone (PROVERA) 5 MG tablet, 5 mg x 5 days every other month is no spontaneous menses, Disp: 15 tablet, Rfl: 1 .  Semaglutide (OZEMPIC) 1 MG/DOSE SOPN, Inject 1 mg into the skin once a week., Disp: 1.5 mL, Rfl: 6   Allergies  Allergen Reactions  . Dexamethasone Sodium Phosphate Other (See Comments)    Pt states after receiving injection had severe nosebleeds, bruising, blood blister on lip   Review of Systems   Pertinent items are noted in the HPI. Otherwise, ROS is negative.  Vitals:  Vitals:   09/28/17 0846  BP: 126/88  Pulse: 96  Temp: 98.6 F (37 C)  TempSrc: Oral  SpO2: 100%  Weight: 212 lb 6.4 oz (96.3 kg)  Height: 5' 2.75" (1.594 m)     Body mass index is 37.93 kg/m.  Physical Exam:   Physical Exam  Constitutional: She appears well-nourished.  HENT:  Head: Normocephalic and atraumatic.  Eyes: Pupils are equal, round, and reactive to light. EOM are normal.  Neck: Normal range of motion. Neck supple.  Cardiovascular: Normal rate, regular rhythm, normal heart sounds and intact distal pulses.  Pulmonary/Chest: Effort normal.  Abdominal: Soft.  Skin: Skin  is warm.  Psychiatric: She has a normal mood and affect. Her behavior is normal.  Nursing note and vitals reviewed.  Lab Results  Component Value Date   HGBA1C 7.9 (A) 09/28/2017   Assessment and Plan:   Tanya Hardy was seen today for follow-up.  Diagnoses and all orders for this visit:  Uncontrolled type 2 diabetes mellitus with hyperglycemia (HCC) Comments: Doing very well. I congratulated her on her A1c improvement from 12.8 to 7.9 in 3 months! Orders: -     POCT glycosylated hemoglobin (Hb A1C) -     CBC with Differential/Platelet  Sarcoidosis -     Ambulatory referral to Dermatology -     Sedimentation rate -     C-reactive protein -     ANA  PCOS (polycystic ovarian syndrome) Comments: She is now having regular menses.  Elevated LFTs -     Comprehensive metabolic panel  Tattoo reaction Comments: Unclear etiology. Derm referral. Labs pending. Orders: -     triamcinolone (KENALOG) 0.025 % ointment; Apply 1 application topically 2 (two) times daily. -     Ambulatory referral to Dermatology -     Sedimentation rate -     C-reactive protein -     ANA  Type 2 diabetes mellitus with hyperlipidemia (HCC) Comments: No statin. Patient interested in pregnancy within the next year.  Orders: -     Lipid panel -     LDL cholesterol, direct  Major depressive disorder with single episode, in full remission (HCC) Comments: Never started Celexa. Will hold now.    . Reviewed expectations re: course of current medical issues. . Discussed self-management of symptoms. . Outlined signs and symptoms indicating need for more acute intervention. . Patient verbalized understanding and all questions were answered. Marland Kitchen. Health Maintenance issues including appropriate healthy diet, exercise, and smoking avoidance were discussed with patient. . See orders for this visit as documented in the electronic medical record. . Patient received an After Visit Summary.  CMA served as Neurosurgeonscribe  during this visit. History, Physical, and Plan performed by medical provider. The above documentation has been reviewed and is accurate and complete. Tanya RimaErica Tanya Hardy, D.O.  Tanya RimaErica Sheneka Schrom, DO , [facility] 10/02/2017

## 2017-09-29 ENCOUNTER — Other Ambulatory Visit: Payer: Self-pay

## 2017-09-29 ENCOUNTER — Telehealth: Payer: Self-pay | Admitting: *Deleted

## 2017-09-29 ENCOUNTER — Ambulatory Visit
Admission: RE | Admit: 2017-09-29 | Discharge: 2017-09-29 | Disposition: A | Discharge status: Home / Self Care | Payer: 59 | Source: Ambulatory Visit | Attending: Family Medicine | Admitting: Family Medicine

## 2017-09-29 DIAGNOSIS — R9389 Abnormal findings on diagnostic imaging of other specified body structures: Secondary | ICD-10-CM

## 2017-09-29 DIAGNOSIS — R945 Abnormal results of liver function studies: Principal | ICD-10-CM

## 2017-09-29 DIAGNOSIS — R7989 Other specified abnormal findings of blood chemistry: Secondary | ICD-10-CM

## 2017-09-29 DIAGNOSIS — R748 Abnormal levels of other serum enzymes: Secondary | ICD-10-CM | POA: Diagnosis not present

## 2017-09-29 NOTE — Telephone Encounter (Signed)
Called and let office know that report received. I have called patient to let her know that she needs CT per report after talking with Lelon MastSamantha but was not able to reach patient.

## 2017-09-29 NOTE — Telephone Encounter (Signed)
Copied from CRM [id number]. Topic: General - Other >> Sep 29, 2017  9:46 AM Marylen PontoMcneil, Ja-Kwan wrote: Reason for CRM: [facility] Squ[facility] to give call report regarding abdomen ultra sound. Requests call back. Cb# [phone] Erskine SquibbJane stated anyone who answers can give the call report.

## 2017-09-30 LAB — ANA: Anti Nuclear Antibody(ANA): NEGATIVE

## 2017-09-30 NOTE — Telephone Encounter (Signed)
Reassure patient that image needs to be more clear - so CT needed. Okay to order.

## 2017-09-30 NOTE — Telephone Encounter (Signed)
Called patient l/m to call office  

## 2017-10-01 ENCOUNTER — Encounter: Payer: Self-pay | Admitting: Family Medicine

## 2017-10-06 ENCOUNTER — Other Ambulatory Visit: Payer: Self-pay

## 2017-10-06 DIAGNOSIS — R933 Abnormal findings on diagnostic imaging of other parts of digestive tract: Secondary | ICD-10-CM

## 2017-10-06 NOTE — Telephone Encounter (Signed)
Patient returned call. Reviewed all information. [facility]. Order placed.

## 2017-10-06 NOTE — Telephone Encounter (Signed)
Left message to return call to our office.  

## 2017-10-20 ENCOUNTER — Ambulatory Visit
Admission: RE | Admit: 2017-10-20 | Discharge: 2017-10-20 | Disposition: A | Discharge status: Home / Self Care | Payer: 59 | Source: Ambulatory Visit | Attending: Family Medicine | Admitting: Family Medicine

## 2017-10-20 DIAGNOSIS — R748 Abnormal levels of other serum enzymes: Secondary | ICD-10-CM | POA: Diagnosis not present

## 2017-10-20 DIAGNOSIS — R933 Abnormal findings on diagnostic imaging of other parts of digestive tract: Secondary | ICD-10-CM

## 2017-10-20 MED ORDER — IOPAMIDOL (ISOVUE-300) INJECTION 61%
100.0000 mL | Freq: Once | INTRAVENOUS | Status: AC | PRN
  Administered 2017-10-20: 100 mL via INTRAVENOUS

## 2017-11-21 ENCOUNTER — Other Ambulatory Visit: Payer: Self-pay | Admitting: Family Medicine

## 2017-11-21 ENCOUNTER — Telehealth: Payer: Self-pay | Admitting: Family Medicine

## 2017-11-21 DIAGNOSIS — I10 Essential (primary) hypertension: Principal | ICD-10-CM

## 2017-11-21 DIAGNOSIS — E669 Obesity, unspecified: Secondary | ICD-10-CM

## 2017-11-21 DIAGNOSIS — E1159 Type 2 diabetes mellitus with other circulatory complications: Secondary | ICD-10-CM

## 2017-11-21 DIAGNOSIS — E1169 Type 2 diabetes mellitus with other specified complication: Principal | ICD-10-CM

## 2017-11-21 NOTE — Telephone Encounter (Signed)
Copied from CRM [id number]. Topic: Quick Communication - See Telephone Encounter >> Nov 21, 2017  4:41 PM Jens Som A wrote: CRM for notification. See Telephone encounter for: 11/21/17. Patient is calling to see if [name] has any samples ofSemaglutide (OZEMPIC) 1 MG/DOSE SOPN [[id number]] until her medication gets refilled. Please advise

## 2017-11-21 NOTE — Telephone Encounter (Signed)
Copied from CRM [id number]. Topic: Quick Communication - Rx Refill/Question >> Nov 21, 2017  4:39 PM Jens Som A wrote: Medication: Semaglutide [facility]) 1 MG/DOSE Namon Cirri [[id number]]   Has the patient contacted their pharmacy? Yes  (Agent: If no, request that the patient contact the pharmacy for the refill.) (Agent: If yes, when and what did the pharmacy advise?)  Preferred Pharmacy (with phone number or street name): Preferred Pharmacy    [facility] [address] [place] - [address] [place] [address] Phone: [phone] Fax: [phone]    Agent: Please be advised that RX refills may take up to 3 business days. We ask that you follow-up with your pharmacy.

## 2017-11-22 ENCOUNTER — Telehealth: Payer: Self-pay | Admitting: Family Medicine

## 2017-11-22 MED ORDER — SEMAGLUTIDE (1 MG/DOSE) 2 MG/1.5ML ~~LOC~~ SOPN: 1.0000 mg | PEN_INJECTOR | SUBCUTANEOUS | 6 refills | Status: DC

## 2017-11-22 NOTE — Telephone Encounter (Signed)
Prescription sent to the pharmacy.

## 2017-11-22 NOTE — Telephone Encounter (Signed)
Copied from CRM [id number]. Topic: General - Other >> Nov 22, 2017  9:18 AM Stephannie Li, NT wrote: Reason for CRM: [facility] called and said the patient did not show for her appointment , and they will not take her as a patient now due to her being a no show please call with any questions [phone]

## 2017-11-22 NOTE — Telephone Encounter (Signed)
Prescription has been sent to pharmacy.

## 2017-11-22 NOTE — Telephone Encounter (Signed)
Left message on patients phone that prescription has been sent to pharmacy.

## 2017-11-22 NOTE — Addendum Note (Signed)
Addended by: Dorian Pod on: 11/22/2017 07:26 AM   Modules accepted: Orders

## 2017-11-22 NOTE — Telephone Encounter (Signed)
See note

## 2017-11-25 ENCOUNTER — Other Ambulatory Visit: Payer: Self-pay | Admitting: Family Medicine

## 2017-11-25 DIAGNOSIS — E669 Obesity, unspecified: Secondary | ICD-10-CM

## 2017-11-25 DIAGNOSIS — E1159 Type 2 diabetes mellitus with other circulatory complications: Secondary | ICD-10-CM

## 2017-11-25 DIAGNOSIS — I10 Essential (primary) hypertension: Principal | ICD-10-CM

## 2017-11-25 DIAGNOSIS — E1169 Type 2 diabetes mellitus with other specified complication: Principal | ICD-10-CM

## 2017-11-25 NOTE — Telephone Encounter (Signed)
PA has been approved. Notified patient and pharmacy.

## 2017-11-25 NOTE — Telephone Encounter (Signed)
Pt called and stated that she is needing a PA for medication. Please advise

## 2017-11-25 NOTE — Telephone Encounter (Signed)
Could she get a sample?

## 2017-12-29 NOTE — Progress Notes (Signed)
Tanya Hardy is a 37 y.o. female is here for follow up.  History of Present Illness:   Tanya Hardy, Tanya Hardy acting as scribe for Dr. Helane Rima.   HPI:   Uncontrolled type 2 diabetes mellitus with hyperglycemia (HCC) Doing very well. A1C will be checked in office to day. She did have issue with getting her ozempic filled last month and was without for three weeks. She did see increase in readings, still less then 200, and weight.   Sarcoidosis At last visit she was referred to [facility]. She had appointment scheduled for 11/22/17 but we received message from their office that patient did not keep appointment and will not be able to reschedule. Patient states that skin improved before appointment so she did not go.   Hyperlipidemia (HCC) Not on statin.  Trying to exercise on a regular basis? Yes. Compliant with diet? Yes.  Lab Results  Component Value Date   CHOL 145 09/28/2017   HDL 29.70 (L) 09/28/2017   LDLCALC Comment 06/22/2017   LDLDIRECT 79.0 09/28/2017   TRIG 287.0 (H) 09/28/2017   CHOLHDL 5 09/28/2017   Lab Results  Component Value Date   ALT 21 09/28/2017   AST 27 09/28/2017   ALKPHOS 87 09/28/2017   BILITOT 0.5 09/28/2017     Reactive depression Divorcing husband. Happy with decision. Prefers women. Husband still trying to be controlling (turning of cell phone and offering to turn it back on if she apologizes). She feels strong and has already made steps to distance herself from him in most ways. They are undergoing marriage counseling, which she now describes as co-parenting counseling.   Health Maintenance Due  Topic Date Due  . URINE MICROALBUMIN  09/28/2017   Depression screen Tanya Hardy 2/9 12/30/2017 06/27/2017 09/28/2016  Decreased Interest 0 3 3  Down, Depressed, Hopeless 1 3 2   PHQ - 2 Score 1 6 5   Altered sleeping 3 3 3   Tired, decreased energy 3 3 3   Change in appetite 0 3 3  Feeling bad or failure about yourself  0 1 0  Trouble  concentrating 0 2 3  Moving slowly or fidgety/restless 0 0 0  Suicidal thoughts 0 0 0  PHQ-9 Score 7 18 17   Difficult doing work/chores Not difficult at all Very difficult -   PMHx, SurgHx, SocialHx, FamHx, Medications, and Allergies were reviewed in the Visit Navigator and updated as appropriate.   Patient Active Problem List   Diagnosis Date Noted  . Mixed diabetic hyperlipidemia associated with type 2 diabetes mellitus (HCC), on Lopid 07/28/2017  . Obesity, diabetes, and hypertension syndrome (HCC) 07/28/2017  . Elevated LFTs 07/28/2017  . Macroalbuminuric diabetic nephropathy (HCC) 10/01/2014  . Secondary amenorrhea 09/30/2014  . PCOS (polycystic ovarian syndrome) 09/30/2014  . Diabetes type 2, uncontrolled (HCC), on Ozempic 07/04/2013  . Hypertension associated with diabetes (HCC) 07/04/2013  . Sarcoidosis 10/06/2012   Social History   Tobacco Use  . Smoking status: Former Smoker    Packs/day: 0.25    Years: 11.00    Pack years: 2.75    Types: Cigarettes, E-cigarettes    Last attempt to quit: 04/07/2012    Years since quitting: 5.7  . Smokeless tobacco: Never Used  Substance Use Topics  . Alcohol use: Yes    Alcohol/week: 5.0 standard drinks    Types: 5 Cans of beer per week  . Drug use: No   Current Medications and Allergies:   .  OZEMPIC, 1 MG/DOSE, 2 MG/1.5ML SOPN, INJECT  1MG  INTO THE SKIN  ONCE A WEEK, Disp: 4 pen, Rfl: 6   Allergies  Allergen Reactions  . Dexamethasone Sodium Phosphate Other (See Comments)    Pt states after receiving injection had severe nosebleeds, bruising, blood blister on lip   Review of Systems   Pertinent items are noted in the HPI. Otherwise, ROS is negative.  Vitals:   Vitals:   12/30/17 0922  BP: 126/88  Temp: 98.7 F (37.1 C)  TempSrc: Oral  Weight: 219 lb (99.3 kg)  Height: 5' 2.75" (1.594 m)     Body mass index is 39.1 kg/m.  Physical Exam:   Physical Exam  Constitutional: She appears well-nourished.  HENT:    Head: Normocephalic and atraumatic.  Eyes: Pupils are equal, round, and reactive to light. EOM are normal.  Neck: Normal range of motion. Neck supple.  Cardiovascular: Normal rate, regular rhythm, normal heart sounds and intact distal pulses.  Pulmonary/Chest: Effort normal.  Abdominal: Soft.  Skin: Skin is warm.  Psychiatric: She has a normal mood and affect. Her behavior is normal.  Nursing note and vitals reviewed.  Assessment and Plan:   1. Sarcoidosis No concerns today.  2. Elevated LFTs Recheck at next visit. Will work on weight loss. Not interested in pregnancy at this point, so can start a statin if needed.   3. Uncontrolled type 2 diabetes mellitus with hyperglycemia (HCC) Slightly worse, but patient without medication x 3 weeks. Will restart and she has been instructed to call if she runs out. Recheck in 3 months with weight loss.  4. Obesity, diabetes, and hypertension syndrome (HCC) Patient complains of obesity. Patient cites health as reasons for wanting to lose weight.  BMI Readings from Last 3 Encounters:  12/30/17 39.10 kg/m  09/28/17 37.93 kg/m  07/29/17 39.57 kg/m   Wt Readings from Last 3 Encounters:  12/30/17 219 lb (99.3 kg)  09/28/17 212 lb 6.4 oz (96.3 kg)  07/29/17 221 lb 9.6 oz (100.5 kg)   I assessed Tanya Hardy to be in an action stage with respect to weight loss.   Plan: 1. Diagnostic studies to rule out secondary causes of obesity: see below. 2. General patient education:  a. Importance of long-term maintenance treatment in weight loss. b. Use non-food self-rewards to reinforce behavior changes. c. Elicit support from others; identify saboteurs. 3. Diet interventions:  a. Risks of dieting were reviewed, including fatigue, temporary hair loss, gallstone formation, gout, and with very low calorie diets, electrolyte abnormalities, nutrient inadequacies, and loss of lean body mass. 4. Exercise intervention:  a. Informal measures, e.g. taking  stairs instead of elevator. b. Formal exercise regimen options discussed as well. 5. Other behavioral treatment: stress management. 6. Other treatment: Medication: PHENTERMINE. 7. Patient to keep a weight log that we will review at follow up. 8. Follow up: 3 months and as needed.  5. Situational mixed anxiety and depressive disorder Improving. Divorcing her husband. More interested in women. Continue current treatment.   Orders Placed This Encounter  Procedures  . Microalbumin / creatinine urine ratio  . POCT glycosylated hemoglobin (Hb A1C)   Meds ordered this encounter  Medications  . phentermine (ADIPEX-P) 37.5 MG tablet    Sig: Take 1 tablet (37.5 mg total) by mouth daily before breakfast.    Dispense:  30 tablet    Refill:  1   . Reviewed expectations re: course of current medical issues. . Discussed self-management of symptoms. . Outlined signs and symptoms indicating need for more acute intervention. Marland Kitchen  Patient verbalized understanding and all questions were answered. Marland Kitchen. Health Maintenance issues including appropriate healthy diet, exercise, and smoking avoidance were discussed with patient. . See orders for this visit as documented in the electronic medical record. . Patient received an After Visit Summary.  Tanya Hardy served as Neurosurgeonscribe during this visit. History, Physical, and Plan performed by medical provider. The above documentation has been reviewed and is accurate and complete. Helane RimaErica Rai Sinagra, D.O.  Helane RimaErica Edgardo Petrenko, DO [place], [facility] 12/30/2017

## 2017-12-30 ENCOUNTER — Encounter: Payer: Self-pay | Admitting: Family Medicine

## 2017-12-30 ENCOUNTER — Ambulatory Visit: Payer: 59 | Admitting: Family Medicine

## 2017-12-30 VITALS — BP 126/88 | Temp 98.7°F | Ht 62.75 in | Wt 219.0 lb

## 2017-12-30 DIAGNOSIS — E669 Obesity, unspecified: Secondary | ICD-10-CM

## 2017-12-30 DIAGNOSIS — F4323 Adjustment disorder with mixed anxiety and depressed mood: Secondary | ICD-10-CM

## 2017-12-30 DIAGNOSIS — D869 Sarcoidosis, unspecified: Secondary | ICD-10-CM

## 2017-12-30 DIAGNOSIS — E1159 Type 2 diabetes mellitus with other circulatory complications: Secondary | ICD-10-CM

## 2017-12-30 DIAGNOSIS — E1169 Type 2 diabetes mellitus with other specified complication: Secondary | ICD-10-CM

## 2017-12-30 DIAGNOSIS — E1165 Type 2 diabetes mellitus with hyperglycemia: Secondary | ICD-10-CM | POA: Diagnosis not present

## 2017-12-30 DIAGNOSIS — R7989 Other specified abnormal findings of blood chemistry: Secondary | ICD-10-CM

## 2017-12-30 DIAGNOSIS — R945 Abnormal results of liver function studies: Secondary | ICD-10-CM

## 2017-12-30 DIAGNOSIS — I152 Hypertension secondary to endocrine disorders: Secondary | ICD-10-CM

## 2017-12-30 DIAGNOSIS — I10 Essential (primary) hypertension: Secondary | ICD-10-CM

## 2017-12-30 LAB — MICROALBUMIN / CREATININE URINE RATIO
Creatinine,U: 98.8 mg/dL
Microalb Creat Ratio: 87.5 mg/g — ABNORMAL HIGH (ref 0.0–30.0)
Microalb, Ur: 86.4 mg/dL — ABNORMAL HIGH (ref 0.0–1.9)

## 2017-12-30 LAB — POCT GLYCOSYLATED HEMOGLOBIN (HGB A1C): Hemoglobin A1C: 7.5 % — AB (ref 4.0–5.6)

## 2017-12-30 MED ORDER — PHENTERMINE HCL 37.5 MG PO TABS: 37.5000 mg | ORAL_TABLET | Freq: Every day | ORAL | 1 refills | Status: DC

## 2018-01-01 ENCOUNTER — Other Ambulatory Visit: Payer: Self-pay | Admitting: Family Medicine

## 2018-02-17 ENCOUNTER — Other Ambulatory Visit: Payer: Self-pay

## 2018-02-17 ENCOUNTER — Telehealth: Payer: Self-pay

## 2018-02-17 MED ORDER — SEMAGLUTIDE (1 MG/DOSE) 2 MG/1.5ML ~~LOC~~ SOPN: 1.0000 mg | PEN_INJECTOR | SUBCUTANEOUS | 0 refills | Status: DC

## 2018-02-17 NOTE — Telephone Encounter (Signed)
Called patient back she needs auth for medication she changed insurance. Provided with sample until able to get auth

## 2018-02-22 NOTE — Telephone Encounter (Signed)
Amber with [employer] calling and states that authorization for OZEMPIC, 1 MG/DOSE, 2 MG/1.5ML SOPN Was sent to the office via fax on 02/17/2018. Would like to know if this was received?

## 2018-02-22 NOTE — Telephone Encounter (Signed)
Called patient let her know that we have not been able to finish auth. Will need new insurance information. No answer no v/m not able to tell patient.

## 2018-02-22 NOTE — Telephone Encounter (Signed)
See note

## 2018-02-23 NOTE — Telephone Encounter (Signed)
We received PPW from insurance have faxed in. We need to get patient new insurance information and offer sample if needed.

## 2018-02-23 NOTE — Telephone Encounter (Signed)
Tanya Hardy (Key: [id number])  Rx #: [id number]  Ozempic (1 MG/DOSE) 2MG /1.5ML pen-injectors  Form [employer] Electronic PA Form  Created  10 days ago  Sent to Plan  3 minutes ago  Plan Response  2 minutes ago  Submit Clinical Questions  1 minute ago  Determination  Favorable  less than a minute ago  Message from [employer];Review Type:Prior Auth;Coverage Start Date:02/08/2018;Coverage End Date:03/09/2021;

## 2018-02-28 ENCOUNTER — Encounter: Payer: Self-pay | Admitting: Family Medicine

## 2018-02-28 ENCOUNTER — Ambulatory Visit (INDEPENDENT_AMBULATORY_CARE_PROVIDER_SITE_OTHER): Payer: Managed Care, Other (non HMO) | Admitting: Family Medicine

## 2018-02-28 VITALS — BP 150/90 | HR 102 | Temp 98.6°F | Ht 63.0 in | Wt 222.8 lb

## 2018-02-28 DIAGNOSIS — F4323 Adjustment disorder with mixed anxiety and depressed mood: Secondary | ICD-10-CM | POA: Diagnosis not present

## 2018-02-28 DIAGNOSIS — E1159 Type 2 diabetes mellitus with other circulatory complications: Secondary | ICD-10-CM | POA: Diagnosis not present

## 2018-02-28 DIAGNOSIS — E669 Obesity, unspecified: Secondary | ICD-10-CM

## 2018-02-28 DIAGNOSIS — E782 Mixed hyperlipidemia: Secondary | ICD-10-CM

## 2018-02-28 DIAGNOSIS — I10 Essential (primary) hypertension: Secondary | ICD-10-CM

## 2018-02-28 DIAGNOSIS — E1165 Type 2 diabetes mellitus with hyperglycemia: Secondary | ICD-10-CM | POA: Diagnosis not present

## 2018-02-28 DIAGNOSIS — I152 Hypertension secondary to endocrine disorders: Secondary | ICD-10-CM

## 2018-02-28 DIAGNOSIS — E1169 Type 2 diabetes mellitus with other specified complication: Secondary | ICD-10-CM

## 2018-02-28 MED ORDER — PHENTERMINE HCL 37.5 MG PO TABS: 37.5000 mg | ORAL_TABLET | Freq: Every day | ORAL | 2 refills | Status: DC

## 2018-02-28 MED ORDER — SIMVASTATIN 10 MG PO TABS: 10.0000 mg | ORAL_TABLET | Freq: Every day | ORAL | 3 refills | Status: DC

## 2018-02-28 MED ORDER — CITALOPRAM HYDROBROMIDE 20 MG PO TABS: 20.0000 mg | ORAL_TABLET | Freq: Every day | ORAL | 3 refills | Status: DC

## 2018-02-28 MED ORDER — LISINOPRIL 10 MG PO TABS: 10.0000 mg | ORAL_TABLET | Freq: Every day | ORAL | 3 refills | Status: DC

## 2018-02-28 NOTE — Progress Notes (Signed)
[name] is a 38 y.o. female is here for follow up.  History of Present Illness:   Tanya Hardy, CMA acting as scribe for Dr. Helane RimaErica Zhoey Blackstock.   HPI: Patient in office for follow up.   Uncontrolled type 2 diabetes mellitus with hyperglycemia (HCC) Doing very well. A1C checked 12/30/17 and was 7.5. She has been taking the Ozempic as directed with no issues or side effects. She has not checked her blood sugar in a few weeks and is not sure what her readings have been.   Hyperlipidemia (HCC) Trying to exercise on a regular basis? Yes. Compliant with diet? Yes.  Lab Results  Component Value Date   CHOL 145 09/28/2017   HDL 29.70 (L) 09/28/2017   LDLCALC Comment 06/22/2017   LDLDIRECT 79.0 09/28/2017   TRIG 287.0 (H) 09/28/2017   CHOLHDL 5 09/28/2017   Reactive depression Divorcing husband. Happy with decision. Prefers women.  Medications are helping with the symptoms she feel like she is getting plenty of sleep.   Health Maintenance Due  Topic Date Due  . OPHTHALMOLOGY EXAM  05/28/2016   [facility] 2/9 02/28/2018 12/30/2017 06/27/2017  Decreased Interest 1 0 3  Down, Depressed, Hopeless 0 1 3  PHQ - 2 Score 1 1 6   Altered sleeping 0 3 3  Tired, decreased energy 1 3 3   Change in appetite 0 0 3  Feeling bad or failure about yourself  0 0 1  Trouble concentrating 0 0 2  Moving slowly or fidgety/restless 0 0 0  Suicidal thoughts 0 0 0  PHQ-9 Score 2 7 18   Difficult doing work/chores Not difficult at all Not difficult at all Very difficult   PMHx, SurgHx, SocialHx, FamHx, Medications, and Allergies were reviewed in the Visit Navigator and updated as appropriate.   Patient Active Problem List   Diagnosis Date Noted  . Mixed diabetic hyperlipidemia associated with type 2 diabetes mellitus (HCC), on Lopid 07/28/2017  . Obesity, diabetes, and hypertension syndrome (HCC) 07/28/2017  . Elevated LFTs 07/28/2017  . Macroalbuminuric diabetic nephropathy (HCC)  10/01/2014  . Secondary amenorrhea 09/30/2014  . PCOS (polycystic ovarian syndrome) 09/30/2014  . Diabetes type 2, uncontrolled (HCC), on Ozempic 07/04/2013  . Hypertension associated with diabetes (HCC) 07/04/2013  . Sarcoidosis 10/06/2012   Social History   Tobacco Use  . Smoking status: Former Smoker    Packs/day: 0.25    Years: 11.00    Pack years: 2.75    Types: Cigarettes, E-cigarettes    Last attempt to quit: 04/07/2012    Years since quitting: 5.8  . Smokeless tobacco: Never Used  Substance Use Topics  . Alcohol use: Yes    Alcohol/week: 5.0 standard drinks    Types: 5 Cans of beer per week  . Drug use: No   Current Medications and Allergies:   .  citalopram (CELEXA) 20 MG tablet, Take 20 mg by mouth daily., Disp: , Rfl: 3 .  OZEMPIC, 1 MG/DOSE, 2 MG/1.5ML SOPN, INJECT 1MG  INTO THE SKIN  ONCE A WEEK, Disp: 4 pen, Rfl: 6 .  phentermine (ADIPEX-P) 37.5 MG tablet, Take 1 tablet (37.5 mg total) by mouth daily before breakfast., Disp: 30 tablet, Rfl: 1   Allergies  Allergen Reactions  . Dexamethasone Sodium Phosphate Other (See Comments)    Pt states after receiving injection had severe nosebleeds, bruising, blood blister on lip   Review of Systems   Pertinent items are noted in the HPI. Otherwise, a complete ROS is negative.  Vitals:   Vitals:   02/28/18 0913  BP: (!) 150/90  Pulse: (!) 102  Temp: 98.6 F (37 C)  TempSrc: Oral  SpO2: 96%  Weight: 222 lb 12.8 oz (101.1 kg)  Height: 5\' 3"  (1.6 m)     Body mass index is 39.47 kg/m.  Physical Exam:   Physical Exam Vitals signs and nursing note reviewed.  HENT:     Head: Normocephalic and atraumatic.  Eyes:     Pupils: Pupils are equal, round, and reactive to light.  Neck:     Musculoskeletal: Normal range of motion and neck supple.  Cardiovascular:     Rate and Rhythm: Normal rate and regular rhythm.     Heart sounds: Normal heart sounds.  Pulmonary:     Effort: Pulmonary effort is normal.    Abdominal:     Palpations: Abdomen is soft.  Skin:    General: Skin is warm.  Psychiatric:        Behavior: Behavior normal.    Assessment and Plan:   Tanya Hardy was seen today for follow-up.  Diagnoses and all orders for this visit:  Uncontrolled type 2 diabetes mellitus with hyperglycemia (HCC) -     Ambulatory referral to Ophthalmology  Obesity, diabetes, and hypertension syndrome (HCC) -     phentermine (ADIPEX-P) 37.5 MG tablet; Take 1 tablet (37.5 mg total) by mouth daily before breakfast.  Situational mixed anxiety and depressive disorder -     citalopram (CELEXA) 20 MG tablet; Take 1 tablet (20 mg total) by mouth daily.  Hypertension associated with diabetes (HCC) -     lisinopril (PRINIVIL,ZESTRIL) 10 MG tablet; Take 1 tablet (10 mg total) by mouth daily.  Mixed diabetic hyperlipidemia associated with type 2 diabetes mellitus (HCC), on Lopid -     simvastatin (ZOCOR) 10 MG tablet; Take 1 tablet (10 mg total) by mouth at bedtime.   . Orders and follow up as documented in EpicCare, reviewed diet, exercise and weight control, cardiovascular risk and specific lipid/LDL goals reviewed, reviewed medications and side effects in detail.  . Reviewed expectations re: course of current medical issues. . Outlined signs and symptoms indicating need for more acute intervention. . Patient verbalized understanding and all questions were answered. . Patient received an After Visit Summary.  CMA served as Neurosurgeon during this visit. History, Physical, and Plan performed by medical provider. The above documentation has been reviewed and is accurate and complete. Helane Rima, D.O.   Helane Rima, DO [place], [facility] 02/28/2018

## 2018-04-03 ENCOUNTER — Encounter: Payer: Self-pay | Admitting: Family Medicine

## 2018-04-03 NOTE — Telephone Encounter (Signed)
Copied from CRM [id number]. Topic: Quick Communication - See Telephone Encounter >> Apr 03, 2018  3:14 PM Terisa Starr wrote: CRM for notification. See Telephone encounter for: 04/03/18.  Patient is calling to see if Dr Earlene Plater can give her some samples of Ozempic (1 MG/DOSE) 2MG /1.5ML pen-injectors ?

## 2018-04-05 NOTE — Telephone Encounter (Signed)
See note

## 2018-05-05 ENCOUNTER — Telehealth: Payer: Self-pay | Admitting: *Deleted

## 2018-05-05 NOTE — Telephone Encounter (Signed)
Schedule Webex °

## 2018-05-15 NOTE — Progress Notes (Signed)
Virtual Visit via Video   I connected with Tanya Hardy on 05/16/18 at  8:00 AM EDT by a video enabled telemedicine application and verified that I am speaking with the correct person using two identifiers. Location patient: Home Location provider: [facility], Office Persons participating in the virtual visit: Marlisa Cavagnaro, Helane Rima, DO Barnie Mort, CMA acting as scribe for Dr. Helane Rima.   I discussed the limitations of evaluation and management by telemedicine and the availability of in person appointments. The patient expressed understanding and agreed to proceed.  Subjective:   HPI:   Obesity, diabetes, and hypertension syndrome (HCC) Patient is on Phentermine 37.5 one tablet daily before breakfast. She is not able to take daily because if she takes daily she feels like her blood pressure is going up. She is taking at most every other day. She will discontinue the phentermine until we have better control on her blood pressure.   Situational mixed anxiety and depressive disorder Divorcing husband. Happy with decision. Prefers women. Medications are helping with the symptoms she feel like she is getting plenty of sleep. She is currently on Celexa daily.   Uncontrolled type 2 diabetes mellitus with hyperglycemia (HCC) Doing very well.A1C checked 12/30/17 and was 7.5.She has been taking the Ozempic as directed with no issues or side effects. She has been taking blood sugars daily and has noticed that she is having fasting readings over 200. She has had reduced activity. She has had increased amount she is eating due to being in the house all the time. She has been working on [employer], broadcasting/film/video. Reducing carbs, and when she is eating.  She has had 3lb wt gain. She denies any signes of infection. She has had some constipation. Given directions to help with that.    Hypertension associated with diabetes (HCC) Review: taking medications as instructed, no medication side  effects noted, no TIAs, no chest pain on exertion, no dyspnea on exertion. Smoker: No. she had had little bit of cough with it. We will change medications today.   BP Readings from Last 3 Encounters:  02/28/18 (!) 150/90  12/30/17 126/88  09/28/17 126/88   Lab Results  Component Value Date   CREATININE 1.08 09/28/2017   CREATININE 0.96 06/22/2017   CREATININE 1.02 06/17/2016     Reviewed all precautions and expectations with prevention of Covid-19. She has been working from home. She will try to incorporate   ROS: See pertinent positives and negatives per HPI.  Patient Active Problem List   Diagnosis Date Noted  . Mixed diabetic hyperlipidemia associated with type 2 diabetes mellitus (HCC), on Zocor 07/28/2017  . Obesity, diabetes, and hypertension syndrome (HCC) 07/28/2017  . Elevated LFTs 07/28/2017  . Macroalbuminuric diabetic nephropathy (HCC), on ARB 10/01/2014  . Secondary amenorrhea 09/30/2014  . PCOS (polycystic ovarian syndrome) 09/30/2014  . Diabetes type 2, uncontrolled (HCC), on Ozempic 07/04/2013  . Hypertension associated with diabetes (HCC) 07/04/2013  . Sarcoidosis 10/06/2012    Social History   Tobacco Use  . Smoking status: Former Smoker    Packs/day: 0.25    Years: 11.00    Pack years: 2.75    Types: Cigarettes, E-cigarettes    Last attempt to quit: 04/07/2012    Years since quitting: 6.1  . Smokeless tobacco: Never Used  Substance Use Topics  . Alcohol use: Yes    Alcohol/week: 5.0 standard drinks    Types: 5 Cans of beer per week   Current Outpatient Medications:  .  phentermine (ADIPEX-P) 37.5 MG tablet, Take 1 tablet (37.5 mg total) by mouth daily before breakfast., Disp: 30 tablet, Rfl: 2 .  simvastatin (ZOCOR) 10 MG tablet, Take 1 tablet (10 mg total) by mouth at bedtime., Disp: 90 tablet, Rfl: 3 .  citalopram (CELEXA) 20 MG tablet, Take 1 tablet (20 mg total) by mouth daily., Disp: 30 tablet, Rfl: 3 .  losartan (COZAAR) 50 MG tablet, Take 1  tablet (50 mg total) by mouth daily., Disp: 90 tablet, Rfl: 3 .  Semaglutide, 1 MG/DOSE, (OZEMPIC, 1 MG/DOSE,) 2 MG/1.5ML SOPN, Inject 1 mg into the skin once a week., Disp: 4 pen, Rfl: 6  Allergies  Allergen Reactions  . Dexamethasone Sodium Phosphate Other (See Comments)    Pt states after receiving injection had severe nosebleeds, bruising, blood blister on lip    Objective:   VITALS: Per patient if applicable, see vitals. GENERAL: Alert, appears well and in no acute distress. HEENT: Atraumatic, conjunctiva clear, no obvious abnormalities on inspection of external nose and ears. NECK: Normal movements of the head and neck. CARDIOPULMONARY: No increased WOB. Speaking in clear sentences. I:E ratio WNL.  MS: Moves all visible extremities without noticeable abnormality. PSYCH: Pleasant and cooperative, well-groomed. Speech normal rate and rhythm. Affect is appropriate. Insight and judgement are appropriate. Attention is focused, linear, and appropriate.  NEURO: CN grossly intact. Oriented as arrived to appointment on time with no prompting. Moves both UE equally.  SKIN: No obvious lesions, wounds, erythema, or cyanosis noted on face or hands.  Assessment and Plan:   Vena was seen today for follow-up.  Diagnoses and all orders for this visit:  Uncontrolled type 2 diabetes mellitus with hyperglycemia (HCC) -     Semaglutide, 1 MG/DOSE, (OZEMPIC, 1 MG/DOSE,) 2 MG/1.5ML SOPN; Inject 1 mg into the skin once a week.  Obesity, diabetes, and hypertension syndrome (HCC) -     Semaglutide, 1 MG/DOSE, (OZEMPIC, 1 MG/DOSE,) 2 MG/1.5ML SOPN; Inject 1 mg into the skin once a week.  Situational mixed anxiety and depressive disorder -     citalopram (CELEXA) 20 MG tablet; Take 1 tablet (20 mg total) by mouth daily.  Mixed diabetic hyperlipidemia associated with type 2 diabetes mellitus (HCC), on Lopid  Hypertension associated with diabetes (HCC) -     losartan (COZAAR) 50 MG tablet; Take 1  tablet (50 mg total) by mouth daily.  Obesity, diabetes, and hypertension syndrome (HCC) Comments: Reviewed weight loss for medical management of this issue.  We will be mindful that the patient is still considering pregnancy in the future. Orders: -     Semaglutide, 1 MG/DOSE, (OZEMPIC, 1 MG/DOSE,) 2 MG/1.5ML SOPN; Inject 1 mg into the skin once a week.  Macroalbuminuric diabetic nephropathy (HCC) -     losartan (COZAAR) 50 MG tablet; Take 1 tablet (50 mg total) by mouth daily.   . Reviewed expectations re: course of current medical issues. . Discussed self-management of symptoms. . Outlined signs and symptoms indicating need for more acute intervention. . Patient verbalized understanding and all questions were answered. Marland Kitchen Health Maintenance issues including appropriate healthy diet, exercise, and smoking avoidance were discussed with patient. . See orders for this visit as documented in the electronic medical record.  Helane Rima, DO 05/16/2018

## 2018-05-16 ENCOUNTER — Encounter: Payer: Self-pay | Admitting: Family Medicine

## 2018-05-16 ENCOUNTER — Other Ambulatory Visit: Payer: Self-pay

## 2018-05-16 ENCOUNTER — Ambulatory Visit (INDEPENDENT_AMBULATORY_CARE_PROVIDER_SITE_OTHER): Payer: Managed Care, Other (non HMO) | Admitting: Family Medicine

## 2018-05-16 VITALS — Ht 63.0 in | Wt 225.0 lb

## 2018-05-16 DIAGNOSIS — E1169 Type 2 diabetes mellitus with other specified complication: Secondary | ICD-10-CM | POA: Diagnosis not present

## 2018-05-16 DIAGNOSIS — E1121 Type 2 diabetes mellitus with diabetic nephropathy: Secondary | ICD-10-CM

## 2018-05-16 DIAGNOSIS — F4323 Adjustment disorder with mixed anxiety and depressed mood: Secondary | ICD-10-CM

## 2018-05-16 DIAGNOSIS — I152 Hypertension secondary to endocrine disorders: Secondary | ICD-10-CM

## 2018-05-16 DIAGNOSIS — E1165 Type 2 diabetes mellitus with hyperglycemia: Secondary | ICD-10-CM | POA: Diagnosis not present

## 2018-05-16 DIAGNOSIS — E669 Obesity, unspecified: Secondary | ICD-10-CM

## 2018-05-16 DIAGNOSIS — E782 Mixed hyperlipidemia: Secondary | ICD-10-CM

## 2018-05-16 DIAGNOSIS — I10 Essential (primary) hypertension: Secondary | ICD-10-CM

## 2018-05-16 DIAGNOSIS — E1159 Type 2 diabetes mellitus with other circulatory complications: Secondary | ICD-10-CM

## 2018-05-16 MED ORDER — CITALOPRAM HYDROBROMIDE 20 MG PO TABS: 20.0000 mg | ORAL_TABLET | Freq: Every day | ORAL | 3 refills | Status: DC

## 2018-05-16 MED ORDER — LOSARTAN POTASSIUM 50 MG PO TABS: 50.0000 mg | ORAL_TABLET | Freq: Every day | ORAL | 3 refills | Status: DC

## 2018-05-16 MED ORDER — SEMAGLUTIDE (1 MG/DOSE) 2 MG/1.5ML ~~LOC~~ SOPN: 1.0000 mg | PEN_INJECTOR | SUBCUTANEOUS | 6 refills | Status: DC

## 2018-06-27 ENCOUNTER — Encounter: Payer: Self-pay | Admitting: Obstetrics and Gynecology

## 2018-06-28 ENCOUNTER — Telehealth: Payer: Self-pay | Admitting: Obstetrics and Gynecology

## 2018-06-28 DIAGNOSIS — N912 Amenorrhea, unspecified: Secondary | ICD-10-CM

## 2018-06-28 NOTE — Telephone Encounter (Signed)
Patient sent the following message through MyChart. Routing to triage to assist patient with request.  Hi I was wondering if I could come in for a visit. My period is late.. tomorrow will make 10 days. It's been coming like clock work every month for 14 months and then this month... my last period was April 11th. I have been in a relationshio for almost 6 months and we haven't been using protection. I'm on some medication that is not baby friendly and I just want to make sure I'm safe.

## 2018-06-28 NOTE — Telephone Encounter (Signed)
Spoke with patient. LMP 05/20/18, cycles have been regular for the past 14 months. SA, no contraceptive. Menses is 10 days late, UPT negative 06/28/18. Intermittent nausea. Denies any pelvic pain, vomiting, fever/chills. Currently taking Adipex and Ozempic, is concerned these are unsafe during pregnancy.   Recommended OV for further evaluation, OV scheduled for 6/1 at 4:30pm with Dr. Oscar La. Advised to return call to office if menses starts or new symptoms develop. Advised I will review with Dr. Oscar La and return call if any additional recommendations. Patient agreeable.   Dr. Oscar La -please review.

## 2018-06-28 NOTE — Telephone Encounter (Signed)
She does have a prior h/o oligomenorrhea and has used provera in the past. Since her appointment with me isn't for a couple of weeks, If she continues to have nausea, develops breast tenderness or any other concerns she should come in for a serum BhcG (given her h/o and medications)

## 2018-06-29 ENCOUNTER — Other Ambulatory Visit: Payer: Self-pay

## 2018-06-29 ENCOUNTER — Ambulatory Visit (INDEPENDENT_AMBULATORY_CARE_PROVIDER_SITE_OTHER): Payer: Managed Care, Other (non HMO)

## 2018-06-29 ENCOUNTER — Other Ambulatory Visit: Payer: Self-pay | Admitting: Obstetrics and Gynecology

## 2018-06-29 VITALS — BP 120/80 | HR 68 | Resp 16 | Wt 224.0 lb

## 2018-06-29 DIAGNOSIS — N912 Amenorrhea, unspecified: Secondary | ICD-10-CM | POA: Diagnosis not present

## 2018-06-29 LAB — POCT URINE PREGNANCY: Preg Test, Ur: NEGATIVE

## 2018-06-29 NOTE — Telephone Encounter (Signed)
Spoke with patient, advised as seen below per Dr. Oscar La. Patient request to come in this afternoon for labs, is unavailable until after 3pm. Lab appt scheduled for today at 3:30 pm. Will keep OV as scheduled on 6/1. Patient denies breast tenderness, pain or bleeding.   Order placed for STAT BhcG.   Routing to provider for final review. Patient is agreeable to disposition. Will close encounter.

## 2018-06-29 NOTE — Telephone Encounter (Signed)
Patient in office,  UPT negative. New order placed for BhcG routine per Dr. Oscar La.

## 2018-06-29 NOTE — Addendum Note (Signed)
Addended by: Leda Min on: 06/29/2018 03:51 PM   Modules accepted: Orders

## 2018-06-29 NOTE — Progress Notes (Signed)
Pt here for urine upt today. Result-neg bloodwork upt done today.

## 2018-06-29 NOTE — Progress Notes (Signed)
Patient without pain, bleeding or ectopic risks. Doesn't need stat BhcG, send routine.

## 2018-06-29 NOTE — Telephone Encounter (Signed)
Patient appointment changed to nurse visit. Call to patient to update. Patient verbalizes understanding and is agreeable.

## 2018-06-29 NOTE — Telephone Encounter (Signed)
Lets do a UPT when she gets here, as long as the UPT is negative send the BhcG routine, if it is + then send it stat.

## 2018-06-30 ENCOUNTER — Telehealth: Payer: Self-pay | Admitting: *Deleted

## 2018-06-30 LAB — BETA HCG QUANT (REF LAB): hCG Quant: <1 m[IU]/mL

## 2018-06-30 NOTE — Telephone Encounter (Signed)
-----   Message from Romualdo Bolk, MD sent at 06/30/2018 10:19 AM EDT ----- The patient doesn't look at her mychart messages. Please let her know her pregnancy testing is negative.

## 2018-06-30 NOTE — Telephone Encounter (Addendum)
Notes recorded by Leda Min, RN on 06/30/2018 at 10:24 AM EDT Left message to call Noreene Larsson, RN at [facility] [phone].    Notes recorded by Leda Min, RN on 06/30/2018 at 2:38 PM EDT Late entry: addendum to previous entry. 06/30/18 at 10:25am Attempted home number on file, automated message states number no longer in service.

## 2018-07-04 NOTE — Telephone Encounter (Signed)
Spoke with patient, advised as seen below per Dr. Oscar La. Patient is scheduled for OV on 07/10/18, will keep as scheduled. Menses has not started to date.  Patient verbalizes understanding and is agreeable.   Routing to provider for final review. Patient is agreeable to disposition. Will close encounter.

## 2018-07-10 ENCOUNTER — Ambulatory Visit: Payer: Managed Care, Other (non HMO) | Admitting: Obstetrics and Gynecology

## 2018-07-10 ENCOUNTER — Encounter: Payer: Self-pay | Admitting: Obstetrics and Gynecology

## 2018-07-10 ENCOUNTER — Other Ambulatory Visit: Payer: Self-pay

## 2018-07-10 VITALS — BP 140/88 | HR 68 | Temp 98.1°F | Wt 224.0 lb

## 2018-07-10 DIAGNOSIS — N912 Amenorrhea, unspecified: Secondary | ICD-10-CM | POA: Diagnosis not present

## 2018-07-10 DIAGNOSIS — Z8639 Personal history of other endocrine, nutritional and metabolic disease: Secondary | ICD-10-CM

## 2018-07-10 MED ORDER — MEDROXYPROGESTERONE ACETATE 5 MG PO TABS: ORAL_TABLET | ORAL | 1 refills | Status: DC

## 2018-07-10 NOTE — Progress Notes (Signed)
GYNECOLOGY  VISIT   HPI: 38 y.o.   Legally Separated Black or African American Not Hispanic or Latino  female   [id number] with Patient's last menstrual period was 05/20/2018.   here for amenorrhea. Negative UPT on 06/29/2018. Beta HCG 06/29/2018 negative.  H/O oligomenorrhea in the past, was previously treated with cyclic provera. She started having normal cycles on her own for ~14 months, LMP 05/20/18. Sexually active, same partner x 6 months. They have been discussing having a baby together.     H/O PCOS, HTN, DM (with nephropathy)  GYNECOLOGIC HISTORY: Patient's last menstrual period was 05/20/2018. Contraception:None Menopausal hormone therapy: None        OB History    Gravida  1   Para      Term      Preterm      AB  1   Living        SAB  1   TAB      Ectopic      Multiple      Live Births                 Patient Active Problem List   Diagnosis Date Noted  . Mixed diabetic hyperlipidemia associated with type 2 diabetes mellitus (HCC), on Zocor 07/28/2017  . Obesity, diabetes, and hypertension syndrome (HCC) 07/28/2017  . Elevated LFTs 07/28/2017  . Macroalbuminuric diabetic nephropathy (HCC), on ARB 10/01/2014  . Secondary amenorrhea 09/30/2014  . PCOS (polycystic ovarian syndrome) 09/30/2014  . Diabetes type 2, uncontrolled (HCC), on Ozempic 07/04/2013  . Hypertension associated with diabetes (HCC) 07/04/2013  . Sarcoidosis 10/06/2012    Past Medical History:  Diagnosis Date  . Amenorrhea   . Anxiety   . Chronic sinusitis   . Complication of anesthesia    anesthesia not strong enough, fought during surgery  . Depression 2011   treated at the time, no longer on meds  . Diabetes mellitus without complication (HCC)    diet controlled  . Headache(784.0)    migraines (rare now)  . Hypertension   . Infertility, female   . Obesity   . PCOS (polycystic ovarian syndrome)   . Shortness of breath    going upstairs, being worked up for sarcoidoisi     Past Surgical History:  Procedure Laterality Date  . NASAL SINUS SURGERY N/A 09/15/2012   Procedure: BILATERAL ENDOSCOPIC MAXILLARY ANTROSTOMY/ETHMOIDECTOMY/FRONTAL SINUS SURGERY and sphenoidotomy;  Surgeon: Serena Colonel, MD;  Location: [facility] OR;  Service: ENT;  Laterality: N/A;  . VIDEO BRONCHOSCOPY Bilateral 05/15/2012   pt styated not completed    Current Outpatient Medications  Medication Sig Dispense Refill  . citalopram (CELEXA) 20 MG tablet Take 1 tablet (20 mg total) by mouth daily. 30 tablet 3  . losartan (COZAAR) 50 MG tablet Take 1 tablet (50 mg total) by mouth daily. 90 tablet 3  . Semaglutide, 1 MG/DOSE, (OZEMPIC, 1 MG/DOSE,) 2 MG/1.5ML SOPN Inject 1 mg into the skin once a week. 4 pen 6  . simvastatin (ZOCOR) 10 MG tablet Take 1 tablet (10 mg total) by mouth at bedtime. 90 tablet 3   No current facility-administered medications for this visit.      ALLERGIES: Dexamethasone sodium phosphate  Family History  Problem Relation Age of Onset  . Hypertension Mother   . Sarcoidosis Brother   . Hypertension Brother   . Uterine cancer Paternal Aunt   . Brain cancer Paternal Grandmother   . Diabetes Paternal Grandmother     Social  History   Socioeconomic History  . Marital status: Legally Separated    Spouse name: Not on file  . Number of children: 0  . Years of education: Not on file  . Highest education level: Not on file  Occupational History  . Occupation: Works at a [employer]  Social Needs  . Financial resource strain: Not on file  . Food insecurity:    Worry: Not on file    Inability: Not on file  . Transportation needs:    Medical: Not on file    Non-medical: Not on file  Tobacco Use  . Smoking status: Former Smoker    Packs/day: 0.25    Years: 11.00    Pack years: 2.75    Types: Cigarettes, E-cigarettes    Last attempt to quit: 04/07/2012    Years since quitting: 6.2  . Smokeless tobacco: Never Used  Substance and Sexual Activity  . Alcohol use: Not  Currently  . Drug use: No  . Sexual activity: Yes    Partners: Male    Birth control/protection: None  Lifestyle  . Physical activity:    Days per week: Not on file    Minutes per session: Not on file  . Stress: Not on file  Relationships  . Social connections:    Talks on phone: Not on file    Gets together: Not on file    Attends religious service: Not on file    Active member of club or organization: Not on file    Attends meetings of clubs or organizations: Not on file    Relationship status: Not on file  . Intimate partner violence:    Fear of current or ex partner: Not on file    Emotionally abused: Not on file    Physically abused: Not on file    Forced sexual activity: Not on file  Other Topics Concern  . Not on file  Social History Narrative  . Not on file    Review of Systems  Constitutional: Negative.   HENT: Negative.   Eyes: Negative.   Respiratory: Negative.   Cardiovascular: Negative.   Gastrointestinal: Positive for nausea.       Bloating  Genitourinary:       Amenorrhea  Musculoskeletal: Negative.   Skin: Negative.   Neurological: Negative.   Endo/Heme/Allergies: Negative.   Psychiatric/Behavioral: Negative.     PHYSICAL EXAMINATION:    BP 140/88 (BP Location: Right Arm, Patient Position: Sitting, Cuff Size: Large)   Temp 98.1 F (36.7 C) (Skin)   Wt 224 lb (101.6 kg)   LMP 05/20/2018   BMI 39.68 kg/m     General appearance: alert, cooperative and appears stated age   ASSESSMENT Oligomenorrhea, previous normal TSH and prolactin. Was on cyclic provera, cycles normalized until April. No cycle since then. Negative pregnancy test She would like to get prenant Diabetes on Semaglutide, recommended to come off 2 months prior to pregnancy HTN on Losartan (cat D) Abnormal lipid panel on Zocor (cat x)    PLAN HgbA1C and TSH today Await 2 weeks of no intercourse or intercourse with protection, then check a UPT, if negative take provera Use  condoms until she is on the correct medications for at least 2 months Start PNV F/U with Dr Earlene Plater to change her medications Information on preparing for pregnancy and advanced maternal age were given We discussed that her medical issues increase her risks as well F/U in one month for an annual exam Will set her up  for a MFM consultation   An After Visit Summary was printed and given to the patient.  ~15 minutes face to face time of which over 50% was spent in counseling.

## 2018-07-10 NOTE — Patient Instructions (Addendum)
You need to use condoms or avoid intercourse x 2 weeks. At the end of 2 weeks check a UPT. If the UPT is negative, take the provera x 5 days. This should bring on a cycle  Start prenatal vitamins  Use condoms   Pregnancy After Age 36 Women who become pregnant after the age of 13 have a higher risk for certain problems during pregnancy. This is because older women may already have health problems before becoming pregnant. Older women who are healthy before pregnancy may still develop problems during pregnancy. These problems may affect the mother, the unborn baby (fetus), or both. What are the risks for me? If you are over age 73 and you want to become pregnant or are pregnant, you may have a higher risk of:  Not being able to get pregnant (infertility).  Going into labor early (preterm labor).  Needing surgical delivery of your baby (cesarean delivery, or C-section).  Having high blood pressure (hypertension).  Having complications during pregnancy, such as high blood pressure and other symptoms (preeclampsia).  Having diabetes during pregnancy (gestational diabetes).  Being pregnant with more than one baby.  Loss of the unborn baby before 20 weeks (miscarriage) or after 20 weeks of pregnancy (stillbirth). What are the risks for my baby? Babies born to women over the age of 82 have a higher risk for:  Being born early (prematurity).  Low birth weight, which is less than 5 lb, 8 oz (2.5 kg).  Birth defects, such as Down syndrome and cleft palate.  Health complications, including problems with growth and development. How is prenatal care different for women over age 51? All women should see their health care provider before they try to become pregnant. This is especially important for women over the age of 21. Tell your health care provider about:  Any health problems you have.  Any medicines you take.  Any family history of health problems or chromosome-related  defects.  Any problems you have had with past pregnancies or deliveries. If you are over age 109 and you plan to become pregnant:  Start taking a daily multivitamin a month or more before you try to get pregnant. Your multivitamin should contain 400 mcg (micrograms) of folic acid. If you are over age 62 and pregnant, make sure you:  Keep taking your multivitamin unless your health care provider tells you not to take it.  Keep all prenatal visits as told by your health care provider. This is important.  Have ultrasounds regularly throughout your pregnancy to check for problems.  Talk with your health care provider about other prenatal screening tests that you may need. What additional prenatal tests are needed? Screening tests show whether your baby has a higher risk for birth defects than other babies. Screening tests include:  Ultrasound tests to look for markers that indicate a risk for birth defects.  Maternal blood screening. These are blood tests that measure certain substances in your blood to determine your baby's risk for defects. Screening tests do not show whether your baby has or does not have defects. They only show your baby's risk for certain defects. If your screening tests show that risk factors are present, you may need tests to confirm the defect (diagnostic testing). These tests may include:  Chorionic villus sampling. For this procedure, a tissue sample is taken from the organ that forms in your uterus to nourish your baby (placenta). The sample is removed through your cervix or abdomen and tested.  Amniocentesis. For this  procedure, a small amount of the fluid that surrounds the baby in the uterus (amniotic fluid) is removed and tested. What can I do to stay healthy during my pregnancy? Staying healthy during pregnancy can help you and your baby to have a lower risk for problems during pregnancy, during delivery, or both. Talk with your health care provider for specific  instructions about staying healthy during your pregnancy. Nutrition   At each meal, eat a variety of foods from each of the five food groups. These groups include: ? Proteins such as lean meats, poultry, fish that is low in fat, beans, eggs, and nuts. ? Vegetables such as leafy greens, raw and cooked vegetables, and vegetable juice. ? Fruits that are fresh, frozen, or canned, or 100% fruit juice. ? Dairy products such as low-fat yogurt, cheese, and milk. ? Whole grains including rice, cereal, pasta, and bread.  Talk with your health care provider about how much food in each group is right for you.  Follow instructions from your health care provider about eating and drinking restrictions during pregnancy. ? Do not eat raw eggs, raw meat, or raw fish or seafood. ? Do not eat any fish that contains high amounts of mercury, such as swordfish or mackerel.  Drink 6-8 or more glasses of water a day. You should drink enough fluid to keep your urine pale yellow. Managing weight gain  Ask your health care provider how much weight gain is healthy during pregnancy.  Stay at a healthy weight. If needed, work with your health care provider to lose weight safely. Activity  Exercise regularly, as directed by your health care provider. Ask your health care provider what forms of exercise are safe for you. General instructions  Do not use any products that contain nicotine or tobacco, such as cigarettes and e-cigarettes. If you need help quitting, ask your health care provider.  Do not drink alcohol, use drugs, or abuse prescription medicine.  Take over-the-counter and prescription medicines only as told by your health care provider.  Do not use hot tubs, steam rooms, or saunas.  Talk with your health care provider about your risk of exposure to harmful environmental conditions. This includes exposure to chemicals, radiation, cleaning products, and cat feces. Follow advice from your health care  provider about how to limit your exposure. Summary  Women who become pregnant after the age of 30 have a higher risk for complications during pregnancy.  Problems may affect the mother, the unborn baby (fetus), or both.  All women should see their health care provider before they try to become pregnant. This is especially important for women over the age of 93.  Staying healthy during pregnancy can help both you and your baby to have a lower risk for some of the problems that can happen during pregnancy, during delivery, or both. This information is not intended to replace advice given to you by your health care provider. Make sure you discuss any questions you have with your health care provider. Document Released: 05/17/2016 Document Revised: 05/17/2016 Document Reviewed: 05/17/2016 Elsevier Interactive Patient Education  2019 [employer].  Preparing for Pregnancy If you are considering becoming pregnant, make an appointment to see your regular health care provider to learn how to prepare for a safe and healthy pregnancy (preconception care). During a preconception care visit, your health care provider will:  Do a complete physical exam, including a Pap test.  Take a complete medical history.  Give you information, answer your questions, and help  you resolve problems. Preconception checklist Medical history  Tell your health care provider about any current or past medical conditions. Your pregnancy or your ability to become pregnant may be affected by chronic conditions, such as diabetes, chronic hypertension, and thyroid problems.  Include your family's medical history as well as your partner's medical history.  Tell your health care provider about any history of STIs (sexually transmitted infections).These can affect your pregnancy. In some cases, they can be passed to your baby. Discuss any concerns that you have about STIs.  If indicated, discuss the benefits of genetic  testing. This testing will show whether there are any genetic conditions that may be passed from you or your partner to your baby.  Tell your health care provider about: ? Any problems you have had with conception or pregnancy. ? Any medicines you take. These include vitamins, herbal supplements, and over-the-counter medicines. ? Your history of immunizations. Discuss any vaccinations that you may need. Diet  Ask your health care provider what to include in a healthy diet that has a balance of nutrients. This is especially important when you are pregnant or preparing to become pregnant.  Ask your health care provider to help you reach a healthy weight before pregnancy. ? If you are overweight, you may be at higher risk for certain complications, such as high blood pressure, diabetes, and preterm birth. ? If you are underweight, you are more likely to have a baby who has a low birth weight. Lifestyle, work, and home  Let your health care provider know: ? About any lifestyle habits that you have, such as alcohol use, drug use, or smoking. ? About recreational activities that may put you at risk during pregnancy, such as downhill skiing and certain exercise programs. ? Tell your health care provider about any international travel, especially any travel to places with an active [place] virus outbreak. ? About harmful substances that you may be exposed to at work or at home. These include chemicals, pesticides, radiation, or even litter boxes. ? If you do not feel safe at home. Mental health  Tell your health care provider about: ? Any history of mental health conditions, including feelings of depression, sadness, or anxiety. ? Any medicines that you take for a mental health condition. These include herbs and supplements. Home instructions to prepare for pregnancy Lifestyle   Eat a balanced diet. This includes fresh fruits and vegetables, whole grains, lean meats, low-fat dairy products,  healthy fats, and foods that are high in fiber. Ask to meet with a nutritionist or registered dietitian for assistance with meal planning and goals.  Get regular exercise. Try to be active for at least 30 minutes a day on most days of the week. Ask your health care provider which activities are safe during pregnancy.  Do not use any products that contain nicotine or tobacco, such as cigarettes and e-cigarettes. If you need help quitting, ask your health care provider.  Do not drink alcohol.  Do not take illegal drugs.  Maintain a healthy weight. Ask your health care provider what weight range is right for you. General instructions  Keep an accurate record of your menstrual periods. This makes it easier for your health care provider to determine your baby's due date.  Begin taking prenatal vitamins and folic acid supplements daily as directed by your health care provider.  Manage any chronic conditions, such as high blood pressure and diabetes, as told by your health care provider. This is important. How do  I know that I am pregnant? You may be pregnant if you have been sexually active and you miss your period. Symptoms of early pregnancy include:  Mild cramping.  Very light vaginal bleeding (spotting).  Feeling unusually tired.  Nausea and vomiting (morning sickness). If you have any of these symptoms and you suspect that you might be pregnant, you can take a home pregnancy test. These tests check for a hormone in your urine (human chorionic gonadotropin, or hCG). A woman's body begins to make this hormone during early pregnancy. These tests are very accurate. Wait until at least the first day after you miss your period to take one. If the test shows that you are pregnant (you get a positive result), call your health care provider to make an appointment for prenatal care. What should I do if I become pregnant?      Make an appointment with your health care provider as soon as you  suspect you are pregnant.  Do not use any products that contain nicotine, such as cigarettes, chewing tobacco, and e-cigarettes. If you need help quitting, ask your health care provider.  Do not drink alcoholic beverages. Alcohol is related to a number of birth defects.  Avoid toxic odors and chemicals.  You may continue to have sexual intercourse if it does not cause pain or other problems, such as vaginal bleeding. This information is not intended to replace advice given to you by your health care provider. Make sure you discuss any questions you have with your health care provider. Document Released: 01/08/2008 Document Revised: 01/27/2017 Document Reviewed: 08/17/2015 Elsevier Interactive Patient Education  2019 [employer].

## 2018-07-12 ENCOUNTER — Encounter: Payer: Self-pay | Admitting: Obstetrics and Gynecology

## 2018-07-12 ENCOUNTER — Telehealth: Payer: Self-pay | Admitting: Obstetrics and Gynecology

## 2018-07-12 NOTE — Telephone Encounter (Signed)
Patient sent the following correspondence through MyChart. Routing to triage to assist patient with request.  Hey this isn't a question but my period started today on its own!!! So I guess it was the stress I was under. Thank you  Routing to Dr. Oscar La for FYI.

## 2018-07-13 LAB — HEMOGLOBIN A1C
Est. average glucose Bld gHb Est-mCnc: 252 mg/dL
Hgb A1c MFr Bld: 10.4 % — ABNORMAL HIGH (ref 4.8–5.6)

## 2018-07-13 LAB — TSH: TSH: 1.02 u[IU]/mL (ref 0.450–4.500)

## 2018-07-18 ENCOUNTER — Other Ambulatory Visit: Payer: Self-pay

## 2018-07-18 ENCOUNTER — Ambulatory Visit (INDEPENDENT_AMBULATORY_CARE_PROVIDER_SITE_OTHER): Payer: Managed Care, Other (non HMO) | Admitting: Family Medicine

## 2018-07-18 ENCOUNTER — Encounter: Payer: Self-pay | Admitting: Family Medicine

## 2018-07-18 VITALS — Ht 63.0 in | Wt 224.0 lb

## 2018-07-18 DIAGNOSIS — E782 Mixed hyperlipidemia: Secondary | ICD-10-CM

## 2018-07-18 DIAGNOSIS — E1165 Type 2 diabetes mellitus with hyperglycemia: Secondary | ICD-10-CM

## 2018-07-18 DIAGNOSIS — I152 Hypertension secondary to endocrine disorders: Secondary | ICD-10-CM

## 2018-07-18 DIAGNOSIS — E1169 Type 2 diabetes mellitus with other specified complication: Secondary | ICD-10-CM

## 2018-07-18 DIAGNOSIS — E669 Obesity, unspecified: Secondary | ICD-10-CM

## 2018-07-18 DIAGNOSIS — I10 Essential (primary) hypertension: Secondary | ICD-10-CM

## 2018-07-18 DIAGNOSIS — E282 Polycystic ovarian syndrome: Secondary | ICD-10-CM

## 2018-07-18 DIAGNOSIS — E1159 Type 2 diabetes mellitus with other circulatory complications: Secondary | ICD-10-CM

## 2018-07-18 NOTE — Progress Notes (Signed)
Virtual Visit via Video   Due to the COVID-19 pandemic, this visit was completed with telemedicine (audio/video) technology to reduce patient and provider exposure as well as to preserve personal protective equipment.   I connected with Tanya Hardy by a video enabled telemedicine application and verified that I am speaking with the correct person using two identifiers. Location patient: Home Location provider: [facility], Office Persons participating in the virtual visit: Addisynn Vassell, Briscoe Deutscher, DO, Lonell Grandchild, CMA acting as scribe for Dr. Briscoe Deutscher.   I discussed the limitations of evaluation and management by telemedicine and the availability of in person appointments. The patient expressed understanding and agreed to proceed.  Care Team   Patient Care Team: Briscoe Deutscher, DO as PCP - General (Family Medicine) Salvadore Dom, MD as Consulting Physician (Obstetrics and Gynecology) Tanda Rockers, MD as Consulting Physician (Pulmonary Disease)  Subjective:   HPI: We received message from Dr. Talbert Nan that patients A1C was elevated. She was out of Trulicity for about a month ago. For about a month and a half. But has been taking daily for over two months. She has not been eating out and has stopped all alcohol consumption. She is aware that she has not been moving much anymore due to covid. We will send her to endocrinology to talk about changing to insulin with pregnancy. If she does not have improvement with readings will add metformin 1000 mg bid. She will not need to take if she is able to do 30 minutes aerobic  exercise daily after large meal. Watch diet and make sure that she is not eating anything with hidden sugars. She will send [place] some fasting readings in the next week and if any better she will not start metformin.   She is currently in a new relationship with man for about 7 months. She is interested in conceiving in the about 6 months.  She is  very happy with the way that the Celexa is working. She does not want to make any changes in this at this time.    Review of Systems  Constitutional: Negative for chills and fever.  HENT: Negative for hearing loss and tinnitus.   Eyes: Negative for blurred vision, double vision and photophobia.  Respiratory: Negative for cough and wheezing.   Cardiovascular: Negative for chest pain, palpitations and leg swelling.  Gastrointestinal: Negative for heartburn, nausea and vomiting.  Genitourinary: Negative for dysuria and urgency.  Musculoskeletal: Negative for myalgias and neck pain.  Neurological: Negative for dizziness and headaches.  Psychiatric/Behavioral: Negative for suicidal ideas.    Patient Active Problem List   Diagnosis Date Noted  . Mixed diabetic hyperlipidemia associated with type 2 diabetes mellitus ([place]), on Zocor 07/28/2017  . Obesity, diabetes, and hypertension syndrome ([place]) 07/28/2017  . Elevated LFTs 07/28/2017  . Macroalbuminuric diabetic nephropathy ([place]), on ARB 10/01/2014  . Secondary amenorrhea 09/30/2014  . PCOS (polycystic ovarian syndrome) 09/30/2014  . Diabetes type 2, uncontrolled ([place]), on Ozempic 07/04/2013  . Hypertension associated with diabetes ([place]) 07/04/2013  . Sarcoidosis 10/06/2012    Social History   Tobacco Use  . Smoking status: Former Smoker    Packs/day: 0.25    Years: 11.00    Pack years: 2.75    Types: Cigarettes, E-cigarettes    Last attempt to quit: 04/07/2012    Years since quitting: 6.2  . Smokeless tobacco: Never Used  Substance Use Topics  . Alcohol use: Not Currently    Current Outpatient Medications:  .  citalopram (CELEXA) 20 MG tablet, Take 1 tablet (20 mg total) by mouth daily., Disp: 30 tablet, Rfl: 3 .  losartan (COZAAR) 50 MG tablet, Take 1 tablet (50 mg total) by mouth daily., Disp: 90 tablet, Rfl: 3 .  medroxyPROGESTERone (PROVERA) 5 MG tablet, Take one tablet a day for 5 days every other month if no spontaneous  menses., Disp: 15 tablet, Rfl: 1 .  Semaglutide, 1 MG/DOSE, (OZEMPIC, 1 MG/DOSE,) 2 MG/1.5ML SOPN, Inject 1 mg into the skin once a week., Disp: 4 pen, Rfl: 6 .  simvastatin (ZOCOR) 10 MG tablet, Take 1 tablet (10 mg total) by mouth at bedtime., Disp: 90 tablet, Rfl: 3  Allergies  Allergen Reactions  . Dexamethasone Sodium Phosphate Other (See Comments)    Pt states after receiving injection had severe nosebleeds, bruising, blood blister on lip    Objective:   VITALS: Per patient if applicable, see vitals. GENERAL: Alert and in no acute distress. CARDIOPULMONARY: No increased WOB. Speaking in clear sentences.  PSYCH: Pleasant and cooperative. Speech normal rate and rhythm. Affect is appropriate. Insight and judgement are appropriate. Attention is focused, linear, and appropriate.  NEURO: Oriented as arrived to appointment on time with no prompting.   [facility] 07/18/2018 02/28/2018 12/30/2017  Decreased Interest 1 1 0  Down, Depressed, Hopeless 0 0 1  PHQ - 2 Score 1 1 1   Altered sleeping 0 0 3  Tired, decreased energy 0 1 3  Change in appetite 0 0 0  Feeling bad or failure about yourself  0 0 0  Trouble concentrating 0 0 0  Moving slowly or fidgety/restless 0 0 0  Suicidal thoughts 0 0 0  PHQ-9 Score 1 2 7   Difficult doing work/chores Not difficult at all Not difficult at all Not difficult at all   Assessment and Plan:   Tanya Hardy was seen today for follow-up.  Diagnoses and all orders for this visit:  Uncontrolled type 2 diabetes mellitus with hyperglycemia (HCC) Comments: A1c went up significantly since our last check. Changes: off Trulicity x 1.5 months, no exercise. Now, back on Trulicity for 4 weeks. Blood sugars are still in the 250s per her report. Hx of severe GI distress with Metformin use. She prefers not to use it. I will refer to Endocrinology to help with plan going forward. Will likely need insulin. She will start exercising for 30 minutes each  day. Orders: -     Ambulatory referral to Endocrinology  Mixed diabetic hyperlipidemia associated with type 2 diabetes mellitus (HCC), on Zocor Comments: She will need to stop statin for pregnancy.  Obesity, diabetes, and hypertension syndrome (HCC)  PCOS (polycystic ovarian syndrome) Comments: Patient wants to try for pregnancy in 6 months. She understands that she cannot take her current medications and that she will be high-risk.  Orders: -     Ambulatory referral to Endocrinology  Hypertension associated with diabetes [facility](HCC) Comments: She will need to change to BB for pregnancy.   Marland Kitchen. COVID-19 Education: The signs and symptoms of COVID-19 were discussed with the patient and how to seek care for testing if needed. The importance of social distancing was discussed today. . Reviewed expectations re: course of current medical issues. . Discussed self-management of symptoms. . Outlined signs and symptoms indicating need for more acute intervention. . Patient verbalized understanding and all questions were answered. Marland Kitchen. Health Maintenance issues including appropriate healthy diet, exercise, and smoking avoidance were discussed with patient. . See orders for this visit as  documented in the electronic medical record.  Briscoe Deutscher, DO  Records requested if needed. Time spent: 25 minutes, of which >50% was spent in obtaining information about her symptoms, reviewing her previous labs, evaluations, and treatments, counseling her about her condition (please see the discussed topics above), and developing a plan to further investigate it; she had a number of questions which I addressed.

## 2018-07-19 ENCOUNTER — Ambulatory Visit: Payer: 59 | Admitting: Obstetrics and Gynecology

## 2018-07-31 ENCOUNTER — Ambulatory Visit: Payer: Managed Care, Other (non HMO) | Admitting: Internal Medicine

## 2018-08-11 ENCOUNTER — Encounter: Payer: Self-pay | Admitting: Family Medicine

## 2018-08-16 ENCOUNTER — Encounter: Payer: Self-pay | Admitting: Obstetrics and Gynecology

## 2018-08-16 ENCOUNTER — Other Ambulatory Visit: Payer: Self-pay

## 2018-08-16 ENCOUNTER — Ambulatory Visit: Payer: Managed Care, Other (non HMO) | Admitting: Obstetrics and Gynecology

## 2018-08-16 VITALS — BP 138/88 | HR 80 | Temp 97.2°F | Ht 62.75 in | Wt 228.0 lb

## 2018-08-16 DIAGNOSIS — Z6841 Body Mass Index (BMI) 40.0 and over, adult: Secondary | ICD-10-CM

## 2018-08-16 DIAGNOSIS — N915 Oligomenorrhea, unspecified: Secondary | ICD-10-CM

## 2018-08-16 DIAGNOSIS — F418 Other specified anxiety disorders: Secondary | ICD-10-CM

## 2018-08-16 DIAGNOSIS — E282 Polycystic ovarian syndrome: Secondary | ICD-10-CM

## 2018-08-16 DIAGNOSIS — N763 Subacute and chronic vulvitis: Secondary | ICD-10-CM

## 2018-08-16 DIAGNOSIS — Z01419 Encounter for gynecological examination (general) (routine) without abnormal findings: Secondary | ICD-10-CM | POA: Diagnosis not present

## 2018-08-16 DIAGNOSIS — N898 Other specified noninflammatory disorders of vagina: Secondary | ICD-10-CM

## 2018-08-16 DIAGNOSIS — Z8639 Personal history of other endocrine, nutritional and metabolic disease: Secondary | ICD-10-CM

## 2018-08-16 DIAGNOSIS — I10 Essential (primary) hypertension: Secondary | ICD-10-CM

## 2018-08-16 DIAGNOSIS — Z113 Encounter for screening for infections with a predominantly sexual mode of transmission: Secondary | ICD-10-CM

## 2018-08-16 DIAGNOSIS — F4323 Adjustment disorder with mixed anxiety and depressed mood: Secondary | ICD-10-CM

## 2018-08-16 MED ORDER — CITALOPRAM HYDROBROMIDE 20 MG PO TABS: 20.0000 mg | ORAL_TABLET | Freq: Every day | ORAL | 3 refills | Status: DC

## 2018-08-16 NOTE — Progress Notes (Signed)
38 y.o. G1P0010 Legally Separated Black or African American Not Hispanic or Latino female here for annual exam.  Same partner x 7-8 months. No dyspareunia. Using condoms every time.   The patient was seen last month with 2 months of amenorrhea. Prior h/o oligomenorrhea that was treated with cyclic provera. She then started having monthly cycles on her own, came consistently for over a year until April, 2020. At her visit last month she expressed desire to get pregnant. It was recommended that she change her medication for DM and HTN and to come off of her Zocar. A MFM referral was recommended. The importance of good control of diabetes prior to pregnancy was stressed.  HgbA1C last month was 10.4%. Dr Juleen China is making changes in her medication and referring her to Endocrinology. TSH was normal last month. Negative BhcG. Script for provera given. She didn't need the provera, got a cycle on her own.  She c/o a 10 month h/o intermittent vulvar itching, no abnormal discharge. Not currently itchy.   Depression and anxiety are controlled with Celexa. She has a small dog, puppy.  H/O PCOS, HTN, DM (with nephropathy) Period Cycle (Days): 28 Period Duration (Days): 2-3 days Period Pattern: (!) Irregular Menstrual Flow: Light Menstrual Control: Panty liner Menstrual Control Change Freq (Hours): changes every 4-6 hours Dysmenorrhea: None  Patient's last menstrual period was 07/28/2018 (exact date).          Sexually active: Yes.    The current method of family planning is condoms sometimes.    Exercising: Yes.    working out with app on phone Smoker:  no  Health Maintenance: Pap:  06-17-16 WNl NEG HR HPV 05-10-13 WNL  History of abnormal Pap:  no MMG:  Never Colonoscopy:  Never BMD:   Never TDaP:  06-17-16 Gardasil: no    reports that she quit smoking about 6 years ago. Her smoking use included cigarettes and e-cigarettes. She has a 2.75 pack-year smoking history. She has never used smokeless  tobacco. She reports previous alcohol use. She reports that she does not use drugs. Working in a call center. Has 4 teenage children (adopted) and one in her 85's with a baby.   Past Medical History:  Diagnosis Date  . Amenorrhea   . Anxiety   . Chronic sinusitis   . Complication of anesthesia    anesthesia not strong enough, fought during surgery  . Depression 2011   treated at the time, no longer on meds  . Diabetes mellitus without complication (HCC)    diet controlled  . Headache(784.0)    migraines (rare now)  . Hypertension   . Infertility, female   . Obesity   . PCOS (polycystic ovarian syndrome)   . Shortness of breath    going upstairs, being worked up for [place]    Past Surgical History:  Procedure Laterality Date  . NASAL SINUS SURGERY N/A 09/15/2012   Procedure: BILATERAL ENDOSCOPIC MAXILLARY ANTROSTOMY/ETHMOIDECTOMY/FRONTAL SINUS SURGERY and sphenoidotomy;  Surgeon: Izora Gala, MD;  Location: [place];  Service: ENT;  Laterality: N/A;  . VIDEO BRONCHOSCOPY Bilateral 05/15/2012   pt styated not completed    Current Outpatient Medications  Medication Sig Dispense Refill  . citalopram (CELEXA) 20 MG tablet Take 1 tablet (20 mg total) by mouth daily. 30 tablet 3  . losartan (COZAAR) 50 MG tablet Take 1 tablet (50 mg total) by mouth daily. 90 tablet 3  . Semaglutide, 1 MG/DOSE, (OZEMPIC, 1 MG/DOSE,) 2 MG/1.5ML SOPN Inject 1 mg into  the skin once a week. 4 pen 6  . simvastatin (ZOCOR) 10 MG tablet Take 1 tablet (10 mg total) by mouth at bedtime. 90 tablet 3  . medroxyPROGESTERone (PROVERA) 5 MG tablet Take one tablet a day for 5 days every other month if no spontaneous menses. (Patient not taking: Reported on 08/16/2018) 15 tablet 1   No current facility-administered medications for this visit.     Family History  Problem Relation Age of Onset  . Hypertension Mother   . Sarcoidosis Brother   . Hypertension Brother   . Uterine cancer Paternal Aunt   . Brain cancer  Paternal Grandmother   . Diabetes Paternal Grandmother     Review of Systems  Constitutional: Negative.   HENT: Negative.   Eyes: Negative.   Respiratory: Negative.   Cardiovascular: Negative.   Gastrointestinal: Negative.   Endocrine: Negative.   Genitourinary:       Itching around urethra  Musculoskeletal: Negative.   Skin: Negative.   Allergic/Immunologic: Negative.   Neurological: Negative.   Hematological: Negative.   Psychiatric/Behavioral: Negative.     Exam:   BP 138/88 (BP Location: Left Arm, Patient Position: Sitting, Cuff Size: Large)   Pulse 80   Temp (!) 97.2 F (36.2 C) (Skin)   Ht 5' 2.75" (1.594 m)   Wt 228 lb (103.4 kg)   LMP 07/28/2018 (Exact Date)   BMI 40.71 kg/m   Weight change: @WEIGHTCHANGE @ Height:   Height: 5' 2.75" (159.4 cm)  Ht Readings from Last 3 Encounters:  08/16/18 5' 2.75" (1.594 m)  07/18/18 5\' 3"  (1.6 m)  05/16/18 5\' 3"  (1.6 m)    General appearance: alert, cooperative and appears stated age Head: Normocephalic, without obvious abnormality, atraumatic Neck: no adenopathy, supple, symmetrical, trachea midline and thyroid normal to inspection and palpation Lungs: clear to auscultation bilaterally Cardiovascular: regular rate and rhythm Breasts: normal appearance, no masses or tenderness Abdomen: soft, non-tender; non distended,  no masses,  no organomegaly Extremities: extremities normal, atraumatic, no cyanosis or edema Skin: Skin color, texture, turgor normal. No rashes or lesions Lymph nodes: Cervical, supraclavicular, and axillary nodes normal. No abnormal inguinal nodes palpated Neurologic: Grossly normal   Pelvic: External genitalia:  no lesions              Urethra:  normal appearing urethra with no masses, tenderness or lesions              Bartholins and Skenes: normal                 Vagina: normal appearing vagina with a slight increase in watery, white vaginal discharge              Cervix: no lesions                Bimanual Exam:  Uterus:  limited by BMI, no masses or tenderness.              Adnexa: no mass, fullness, tenderness               Rectovaginal: Confirms               Anus:  normal sphincter tone, no lesions  Chaperone was present for exam.  A:  Well Woman with normal exam  Uncontrolled DM with nephropathy  HTN  Elevated lipids  Family planning  Vulvitis  P:   No pap this year  Aware of the importance of good control of DM prior to pregnancy  STD  testing  Referral to MFM  Discussed breast self exam  Discussed calcium and vit D intake  Affirm sent.  Discussed vulvar skin care

## 2018-08-16 NOTE — Progress Notes (Signed)
Referral to MFM placed

## 2018-08-16 NOTE — Patient Instructions (Signed)
EXERCISE AND DIET:  We recommended that you start or continue a regular exercise program for good health. Regular exercise means any activity that makes your heart beat faster and makes you sweat.  We recommend exercising at least 30 minutes per day at least 3 days a week, preferably 4 or 5.  We also recommend a diet low in fat and sugar.  Inactivity, poor dietary choices and obesity can cause diabetes, heart attack, stroke, and kidney damage, among others.    ALCOHOL AND SMOKING:  Women should limit their alcohol intake to no more than 7 drinks/beers/glasses of wine (combined, not each!) per week. Moderation of alcohol intake to this level decreases your risk of breast cancer and liver damage. And of course, no recreational drugs are part of a healthy lifestyle.  And absolutely no smoking or even second hand smoke. Most people know smoking can cause heart and lung diseases, but did you know it also contributes to weakening of your bones? Aging of your skin?  Yellowing of your teeth and nails?  CALCIUM AND VITAMIN D:  Adequate intake of calcium and Vitamin D are recommended.  The recommendations for exact amounts of these supplements seem to change often, but generally speaking 1,000 mg of calcium (between diet and supplement) and 800 units of Vitamin D per day seems prudent. Certain women may benefit from higher intake of Vitamin D.  If you are among these women, your doctor will have told you during your visit.    PAP SMEARS:  Pap smears, to check for cervical cancer or precancers,  have traditionally been done yearly, although recent scientific advances have shown that most women can have pap smears less often.  However, every woman still should have a physical exam from her gynecologist every year. It will include a breast check, inspection of the vulva and vagina to check for abnormal growths or skin changes, a visual exam of the cervix, and then an exam to evaluate the size and shape of the uterus and  ovaries.  And after 36 years of age, a rectal exam is indicated to check for rectal cancers. We will also provide age appropriate advice regarding health maintenance, like when you should have certain vaccines, screening for sexually transmitted diseases, bone density testing, colonoscopy, mammograms, etc.   MAMMOGRAMS:  All women over 40 years old should have a yearly mammogram. Many facilities now offer a "3D" mammogram, which may cost around $50 extra out of pocket. If possible,  we recommend you accept the option to have the 3D mammogram performed.  It both reduces the number of women who will be called back for extra views which then turn out to be normal, and it is better than the routine mammogram at detecting truly abnormal areas.    COLON CANCER SCREENING: Now recommend starting at age 45. At this time colonoscopy is not covered for routine screening until 50. There are take home tests that can be done between 45-49.   COLONOSCOPY:  Colonoscopy to screen for colon cancer is recommended for all women at age 50.  We know, you hate the idea of the prep.  We agree, BUT, having colon cancer and not knowing it is worse!!  Colon cancer so often starts as a polyp that can be seen and removed at colonscopy, which can quite literally save your life!  And if your first colonoscopy is normal and you have no family history of colon cancer, most women don't have to have it again for   10 years.  Once every ten years, you can do something that may end up saving your life, right?  We will be happy to help you get it scheduled when you are ready.  Be sure to check your insurance coverage so you understand how much it will cost.  It may be covered as a preventative service at no cost, but you should check your particular policy.      Breast Self-Awareness Breast self-awareness means being familiar with how your breasts look and feel. It involves checking your breasts regularly and reporting any changes to your  health care provider. Practicing breast self-awareness is important. A change in your breasts can be a sign of a serious medical problem. Being familiar with how your breasts look and feel allows you to find any problems early, when treatment is more likely to be successful. All women should practice breast self-awareness, including women who have had breast implants. How to do a breast self-exam One way to learn what is normal for your breasts and whether your breasts are changing is to do a breast self-exam. To do a breast self-exam: Look for Changes  1. Remove all the clothing above your waist. 2. Stand in front of a mirror in a room with good lighting. 3. Put your hands on your hips. 4. Push your hands firmly downward. 5. Compare your breasts in the mirror. Look for differences between them (asymmetry), such as: ? Differences in shape. ? Differences in size. ? Puckers, dips, and bumps in one breast and not the other. 6. Look at each breast for changes in your skin, such as: ? Redness. ? Scaly areas. 7. Look for changes in your nipples, such as: ? Discharge. ? Bleeding. ? Dimpling. ? Redness. ? A change in position. Feel for Changes Carefully feel your breasts for lumps and changes. It is best to do this while lying on your back on the floor and again while sitting or standing in the shower or tub with soapy water on your skin. Feel each breast in the following way:  Place the arm on the side of the breast you are examining above your head.  Feel your breast with the other hand.  Start in the nipple area and make  inch (2 cm) overlapping circles to feel your breast. Use the pads of your three middle fingers to do this. Apply light pressure, then medium pressure, then firm pressure. The light pressure will allow you to feel the tissue closest to the skin. The medium pressure will allow you to feel the tissue that is a little deeper. The firm pressure will allow you to feel the tissue  close to the ribs.  Continue the overlapping circles, moving downward over the breast until you feel your ribs below your breast.  Move one finger-width toward the center of the body. Continue to use the  inch (2 cm) overlapping circles to feel your breast as you move slowly up toward your collarbone.  Continue the up and down exam using all three pressures until you reach your armpit.  Write Down What You Find  Write down what is normal for each breast and any changes that you find. Keep a written record with breast changes or normal findings for each breast. By writing this information down, you do not need to depend only on memory for size, tenderness, or location. Write down where you are in your menstrual cycle, if you are still menstruating. If you are having trouble noticing differences   in your breasts, do not get discouraged. With time you will become more familiar with the variations in your breasts and more comfortable with the exam. How often should I examine my breasts? Examine your breasts every month. If you are breastfeeding, the best time to examine your breasts is after a feeding or after using a breast pump. If you menstruate, the best time to examine your breasts is 5-7 days after your period is over. During your period, your breasts are lumpier, and it may be more difficult to notice changes. When should I see my health care provider? See your health care provider if you notice:  A change in shape or size of your breasts or nipples.  A change in the skin of your breast or nipples, such as a reddened or scaly area.  Unusual discharge from your nipples.  A lump or thick area that was not there before.  Pain in your breasts.  Anything that concerns you.  

## 2018-08-17 ENCOUNTER — Telehealth: Payer: Self-pay

## 2018-08-17 LAB — VAGINITIS/VAGINOSIS, DNA PROBE
Candida Species: NEGATIVE
Gardnerella vaginalis: POSITIVE — AB
Trichomonas vaginosis: NEGATIVE

## 2018-08-17 LAB — HIV ANTIBODY (ROUTINE TESTING W REFLEX): HIV Screen 4th Generation wRfx: NONREACTIVE

## 2018-08-17 LAB — RPR: RPR Ser Ql: NONREACTIVE

## 2018-08-17 MED ORDER — METRONIDAZOLE 0.75 % VA GEL: 1.0000 | Freq: Every day | VAGINAL | 0 refills | Status: DC

## 2018-08-17 NOTE — Telephone Encounter (Signed)
-----   Message from Salvadore Dom, MD sent at 08/17/2018  9:53 AM EDT ----- Please inform the patient that her vaginitis probe was + for BV and treat with flagyl (either oral or vaginal, her choice), no ETOH while on Flagyl.  Oral: Flagyl 500 mg BID x 7 days, or Vaginal: Metrogel, 1 applicator per vagina q day x 5 days. RPR negative, HIV and cervical cultures are pending.

## 2018-08-17 NOTE — Telephone Encounter (Signed)
Spoke with patient. Advised of message as seen below from [place]. Patient verbalizes understanding. Rx for Metrogel 1 applicator per vagina q night x 5 nights sent to pharmacy on file. Patient verbalizes understanding. Encounter closed.

## 2018-08-23 ENCOUNTER — Other Ambulatory Visit: Payer: Self-pay

## 2018-08-23 LAB — GC/CHLAMYDIA PROBE AMP
Chlamydia trachomatis, NAA: POSITIVE — AB
Neisseria Gonorrhoeae by PCR: NEGATIVE

## 2018-08-24 ENCOUNTER — Telehealth: Payer: Self-pay

## 2018-08-24 MED ORDER — AZITHROMYCIN 250 MG PO TABS: ORAL_TABLET | ORAL | 0 refills | Status: DC

## 2018-08-24 NOTE — Telephone Encounter (Signed)
-----   Message from Salvadore Dom, MD sent at 08/23/2018  6:07 PM EDT ----- Please inform the patient that she has chlamydia and treat with azithromycin 1 gram po x 1. Please offer expedited partner therapy. They should avoid intercourse until one week after they have both been treated, then use condoms. She needs a f/u cervical culture in 3 months.

## 2018-08-24 NOTE — Telephone Encounter (Signed)
Spoke with patient. Notified of Pos.Chlamydia. advised will send Rx for Azithromycin 1gm po once, NR. Offered expedited partner therapy--patient declined stating she will have him see his doctor for treatment. Advised no intercourse for one week after both treated and to use condoms. Made 3 month TOC appointment 11-22-18 2:45pm. Advised patient the [place] will be notified.  Form completed for [facility] and faxed.

## 2018-08-28 ENCOUNTER — Ambulatory Visit: Payer: Managed Care, Other (non HMO) | Admitting: Internal Medicine

## 2018-08-28 NOTE — Progress Notes (Deleted)
Name: Tanya Hardy  MRN/ DOB: [id number], 07-Apr-1980   Age/ Sex: 38 y.o., female    PCP: Briscoe Deutscher, DO   Reason for Endocrinology Evaluation: Type 2 Diabetes Mellitus     Date of Initial Endocrinology Visit: 08/28/2018     PATIENT IDENTIFIER: [name] is a 38 y.o. female with a past medical history of PCOS, T2DM . The patient presented for initial endocrinology clinic visit on 08/28/2018 for consultative assistance with her diabetes management.    HPI: Ms. Donlon was    Diagnosed with T2DM *** Prior Medications tried/Intolerance: *** Currently checking blood sugars *** x / day,  before breakfast and ***.  Hypoglycemia episodes : ***               Symptoms: ***                 Frequency: ***/  Hemoglobin A1c has ranged from 7.5% in 2019, peaking at 12.8% in 2019. Patient required assistance for hypoglycemia:  Patient has required hospitalization within the last 1 year from hyper or hypoglycemia:   In terms of diet, the patient ***   PCOS History :    HOME DIABETES REGIMEN: Ozempic 1.0 mg weekly    Statin: {Yes/No:11203} ACE-I/ARB: {YES/NO:17245} Prior Diabetic Education: {Yes/No:11203}   METER DOWNLOAD SUMMARY: Date range evaluated: *** Fingerstick Blood Glucose Tests = *** Average Number Tests/Day = *** Overall Mean FS Glucose = *** Standard Deviation = ***  BG Ranges: Low = *** High = ***   Hypoglycemic Events/30 Days: BG < 50 = *** Episodes of symptomatic severe hypoglycemia = ***   DIABETIC COMPLICATIONS: Microvascular complications:   ***  Denies: ***  Last eye exam: Completed   Macrovascular complications:   ***  Denies: CAD, PVD, CVA   PAST HISTORY: Past Medical History:  Past Medical History:  Diagnosis Date  . Amenorrhea   . Anxiety   . Chronic sinusitis   . Complication of anesthesia    anesthesia not strong enough, fought during surgery  . Depression 2011   treated at the time, no longer on meds  .  Diabetes mellitus without complication (HCC)    diet controlled  . Headache(784.0)    migraines (rare now)  . Hypertension   . Infertility, female   . Obesity   . PCOS (polycystic ovarian syndrome)   . Shortness of breath    going upstairs, being worked up for [place]    Past Surgical History:  Past Surgical History:  Procedure Laterality Date  . NASAL SINUS SURGERY N/A 09/15/2012   Procedure: BILATERAL ENDOSCOPIC MAXILLARY ANTROSTOMY/ETHMOIDECTOMY/FRONTAL SINUS SURGERY and sphenoidotomy;  Surgeon: Izora Gala, MD;  Location: [place];  Service: ENT;  Laterality: N/A;  . VIDEO BRONCHOSCOPY Bilateral 05/15/2012   pt styated not completed      Social History:  reports that she quit smoking about 6 years ago. Her smoking use included cigarettes and e-cigarettes. She has a 2.75 pack-year smoking history. She has never used smokeless tobacco. She reports previous alcohol use. She reports that she does not use drugs. Family History:  Family History  Problem Relation Age of Onset  . Hypertension Mother   . Sarcoidosis Brother   . Hypertension Brother   . Uterine cancer Paternal Aunt   . Brain cancer Paternal Grandmother   . Diabetes Paternal Grandmother       HOME MEDICATIONS: Allergies as of 08/28/2018      Reactions   Dexamethasone Sodium Phosphate Other (See Comments)  Pt states after receiving injection had severe nosebleeds, bruising, blood blister on lip      Medication List       Accurate as of August 28, 2018  1:00 PM. If you have any questions, ask your nurse or doctor.        azithromycin 250 MG tablet Commonly known as: ZITHROMAX Take all 4 tablets at once   citalopram 20 MG tablet Commonly known as: CELEXA Take 1 tablet (20 mg total) by mouth daily.   losartan 50 MG tablet Commonly known as: COZAAR Take 1 tablet (50 mg total) by mouth daily.   medroxyPROGESTERone 5 MG tablet Commonly known as: Provera Take one tablet a day for 5 days every other month  if no spontaneous menses.   metroNIDAZOLE 0.75 % vaginal gel Commonly known as: METROGEL Place 1 Applicatorful vaginally at bedtime. X 5 nights   Semaglutide (1 MG/DOSE) 2 MG/1.5ML Sopn Commonly known as: Ozempic (1 MG/DOSE) Inject 1 mg into the skin once a week.   simvastatin 10 MG tablet Commonly known as: ZOCOR Take 1 tablet (10 mg total) by mouth at bedtime.        ALLERGIES: Allergies  Allergen Reactions  . Dexamethasone Sodium Phosphate Other (See Comments)    Pt states after receiving injection had severe nosebleeds, bruising, blood blister on lip     REVIEW OF SYSTEMS: A comprehensive ROS was conducted with the patient and is negative except as per HPI and below:  ROS    OBJECTIVE:   VITAL SIGNS: There were no vitals taken for this visit.   PHYSICAL EXAM:  General: Pt appears well and is in NAD  Hydration: Well-hydrated with moist mucous membranes and good skin turgor  HEENT: Head: Unremarkable with good dentition. Oropharynx clear without exudate.  Eyes: External eye exam normal without stare, lid lag or exophthalmos.  EOM intact.  PERRL.  Neck: General: Supple without adenopathy or carotid bruits. Thyroid: Thyroid size normal.  No goiter or nodules appreciated. No thyroid bruit.  Lungs: Clear with good BS bilat with no rales, rhonchi, or wheezes  Heart: RRR with normal S1 and S2 and no gallops; no murmurs; no rub  Abdomen: Normoactive bowel sounds, soft, nontender, without masses or organomegaly palpable  Extremities:  Lower extremities - No pretibial edema. No lesions.  Skin: Normal texture and temperature to palpation. No rash noted. No Acanthosis nigricans/skin tags. No lipohypertrophy.  Neuro: MS is good with appropriate affect, pt is alert and Ox3    DM foot exam:    DATA REVIEWED:  Lab Results  Component Value Date   HGBA1C 10.4 (H) 07/10/2018   HGBA1C 7.5 (A) 12/30/2017   HGBA1C 7.9 (A) 09/28/2017   Lab Results  Component Value Date    MICROALBUR 86.4 (H) 12/30/2017   LDLCALC Comment 06/22/2017   CREATININE 1.08 09/28/2017   Lab Results  Component Value Date   MICRALBCREAT 87.5 (H) 12/30/2017    Lab Results  Component Value Date   CHOL 145 09/28/2017   HDL 29.70 (L) 09/28/2017   LDLCALC Comment 06/22/2017   LDLDIRECT 79.0 09/28/2017   TRIG 287.0 (H) 09/28/2017   CHOLHDL 5 09/28/2017        ASSESSMENT / PLAN / RECOMMENDATIONS:   1) Type *** Diabetes Mellitus, ***controlled, With*** complications - Most recent A1c of *** %. Goal A1c < *** %.  ***  Plan: GENERAL:  ***  MEDICATIONS:  ***  EDUCATION / INSTRUCTIONS:  BG monitoring instructions: Patient is instructed to check  her blood sugars *** times a day, ***.  Call [place] [facility] if: BG persistently < 70 or > 300. . I reviewed the Rule of 15 for the treatment of hypoglycemia in detail with the patient. Literature supplied.   2) Diabetic complications:   Eye: Does *** have known diabetic retinopathy.   Neuro/ Feet: Does *** have known diabetic peripheral neuropathy.  Renal: Patient does *** have known baseline CKD. She is *** on an ACEI/ARB at present.Check urine albumin/creatinine ratio yearly starting at time of diagnosis. If albuminuria is positive, treatment is geared toward better glucose, blood pressure control and use of ACE inhibitors or ARBs. Monitor electrolytes and creatinine once to twice yearly.   3) Lipids: Patient is *** on a statin.    4) Hypertension: ***  at goal of < 140/90 mmHg.       Signed electronically by: Lyndle HerrlichAbby Jaralla Gyasi Hazzard, MD  [facility]  [facility] [address] [place], [place] [address] Phone: [phone] FAX: [phone]   CC: Helane RimaWallace, Erica, DO [address] [place] [address] Phone: [phone]  Fax: [phone]    Return to Endocrinology clinic as below: Future Appointments  Date Time Provider Department Center  08/28/2018   3:00 PM Caitriona Sundquist, Konrad DoloresIbtehal Jaralla, MD [facility] None  11/22/2018  2:45 PM [name], [name], MD [facility] None  08/22/2019  3:30 PM [name], [name], MD [facility] None

## 2018-08-30 ENCOUNTER — Telehealth: Payer: Self-pay | Admitting: Obstetrics and Gynecology

## 2018-08-30 ENCOUNTER — Encounter: Payer: Self-pay | Admitting: Obstetrics and Gynecology

## 2018-08-30 MED ORDER — TERCONAZOLE 0.4 % VA CREA: 1.0000 | TOPICAL_CREAM | Freq: Every day | VAGINAL | 0 refills | Status: DC

## 2018-08-30 NOTE — Telephone Encounter (Signed)
Spoke with patient advised per Dr. Talbert Nan. Patient verbalizes understanding and is agreeable.    Drug interaction noted, Rx pended. Dr. Talbert Nan -please review.  Drug-Drug: fluconazole and simvastatin The risk of myopathy and rhabdomyolysis may be increased by coadministration of a statin and fluconazole.

## 2018-08-30 NOTE — Telephone Encounter (Signed)
Spoke with patient. Advised Dr.Jertson sent in Rx for Terazol place 1 applicator nightly for 7 nights to pharmacy on file due to risk for reaction with Diflucan and Simvastatin. Encounter closed.

## 2018-08-30 NOTE — Telephone Encounter (Signed)
You can give her one diflucan tablet 150 mg po x 1. If she isn't better in a few days she should be seen. She can use an over the counter steroid ointment externally to help with the itching (can use BID for up to a week)

## 2018-08-30 NOTE — Telephone Encounter (Signed)
Spoke with patient. Patient has completed azithromycin and metrogel for chlamydia and BV. Reports vaginal itching, burning and swelling around clitoris, started 3rd day into metrogel. States she feel like she has developed a yeast infection. Advised can try OTC monistat, if symptoms do not resolve, will need OV fir further evaluation. Patient states she would prefer not to use anything OTC. Advised I will review with Dr. Talbert Nan and return call, patient agreeable.   Dr. Talbert Nan -please advise.

## 2018-08-30 NOTE — Telephone Encounter (Signed)
Patient sent the following correspondence through [place]. Routing to triage to assist patient with request.  Good morning. So I finished all of my medication...and I think I'm having an reaction. My lady bits feel itchy with a slight burning sensation. Please help!

## 2018-09-03 NOTE — Progress Notes (Signed)
Virtual Visit via Video   Due to the COVID-19 pandemic, this visit was completed with telemedicine (audio/video) technology to reduce patient and provider exposure as well as to preserve personal protective equipment.   I connected with Tanya Hardy by a video enabled telemedicine application and verified that I am speaking with the correct person using two identifiers. Location patient: Home Location provider: [facility], Office Persons participating in the virtual visit: Tanya DillsLatasha Hardy, Tanya RimaErica Zannie Locastro, DO   I discussed the limitations of evaluation and management by telemedicine and the availability of in person appointments. The patient expressed understanding and agreed to proceed.  Care Team   Patient Care Team: Tanya RimaWallace, Tanya Emmons, DO as PCP - General (Family Medicine) Tanya BolkJertson, Tanya Evelyn, MD as Consulting Physician (Obstetrics and Gynecology) Tanya CowdenWert, Tanya B, MD as Consulting Physician (Pulmonary Disease)  Subjective:   HPI: Patient would like to talk about bariatric surgery options. She does exercise three times a week for 20 minutes each. She has been working on [employer] diet plan. She has seen nutrition in the past has been several years ago. She has been on Ozempic and it was helpful at first. She does not feel like it is helping much now.   Review of Systems  Constitutional: Negative for chills and fever.  HENT: Negative for hearing loss and tinnitus.   Eyes: Negative for blurred vision and double vision.  Respiratory: Negative for cough and wheezing.   Cardiovascular: Negative for chest pain and palpitations.  Gastrointestinal: Negative for nausea and vomiting.  Genitourinary: Negative for dysuria and urgency.  Musculoskeletal: Negative for myalgias and neck pain.  Neurological: Negative for dizziness and headaches.  Endo/Heme/Allergies: Does not bruise/bleed easily.  Psychiatric/Behavioral: Negative for depression and suicidal ideas.    Patient Active Problem List    Diagnosis Date Noted  . Morbid obesity with BMI of 40.0-44.9, adult (HCC) 09/04/2018  . Mixed diabetic hyperlipidemia associated with type 2 diabetes mellitus (HCC), on Zocor 07/28/2017  . Obesity, diabetes, and hypertension syndrome (HCC) 07/28/2017  . Elevated LFTs 07/28/2017  . Macroalbuminuric diabetic nephropathy (HCC), on ARB 10/01/2014  . Secondary amenorrhea 09/30/2014  . PCOS (polycystic ovarian syndrome) 09/30/2014  . Diabetes type 2, uncontrolled (HCC), on Ozempic 07/04/2013  . Hypertension associated with diabetes (HCC) 07/04/2013  . Sarcoidosis 10/06/2012    Social History   Tobacco Use  . Smoking status: Former Smoker    Packs/day: 0.25    Years: 11.00    Pack years: 2.75    Types: Cigarettes, E-cigarettes    Quit date: 04/07/2012    Years since quitting: 6.4  . Smokeless tobacco: Never Used  Substance Use Topics  . Alcohol use: Not Currently   Current Outpatient Medications:  .  citalopram (CELEXA) 20 MG tablet, Take 1 tablet (20 mg total) by mouth daily., Disp: 90 tablet, Rfl: 3 .  losartan (COZAAR) 50 MG tablet, Take 1 tablet (50 mg total) by mouth daily., Disp: 90 tablet, Rfl: 3 .  medroxyPROGESTERone (PROVERA) 5 MG tablet, Take one tablet a day for 5 days every other month if no spontaneous menses., Disp: 15 tablet, Rfl: 1 .  Semaglutide, 1 MG/DOSE, (OZEMPIC, 1 MG/DOSE,) 2 MG/1.5ML SOPN, Inject 1 mg into the skin once a week., Disp: 4 pen, Rfl: 6 .  simvastatin (ZOCOR) 10 MG tablet, Take 1 tablet (10 mg total) by mouth at bedtime., Disp: 90 tablet, Rfl: 3  Allergies  Allergen Reactions  . Dexamethasone Sodium Phosphate Other (See Comments)    Pt states after  receiving injection had severe nosebleeds, bruising, blood blister on lip   Objective:   VITALS: Per patient if applicable, see vitals. GENERAL: Alert, appears well and in no acute distress. HEENT: Atraumatic, conjunctiva clear, no obvious abnormalities on inspection of external nose and ears. NECK:  Normal movements of the head and neck. CARDIOPULMONARY: No increased WOB. Speaking in clear sentences. I:E ratio WNL.  MS: Moves all visible extremities without noticeable abnormality. PSYCH: Pleasant and cooperative, well-groomed. Speech normal rate and rhythm. Affect is appropriate. Insight and judgement are appropriate. Attention is focused, linear, and appropriate.  NEURO: CN grossly intact. Oriented as arrived to appointment on time with no prompting. Moves both UE equally.  SKIN: No obvious lesions, wounds, erythema, or cyanosis noted on face or hands.  Depression screen [facility] 2/9 09/04/2018 07/18/2018 02/28/2018  Decreased Interest 0 1 1  Down, Depressed, Hopeless 0 0 0  PHQ - 2 Score 0 1 1  Altered sleeping 0 0 0  Tired, decreased energy 0 0 1  Change in appetite 3 0 0  Feeling bad or failure about yourself  0 0 0  Trouble concentrating 0 0 0  Moving slowly or fidgety/restless 0 0 0  Suicidal thoughts 0 0 0  PHQ-9 Score 3 1 2   Difficult doing work/chores Not difficult at all Not difficult at all Not difficult at all   Assessment and Plan:   [name] was seen today for follow-up.  Diagnoses and all orders for this visit:  Uncontrolled type 2 diabetes mellitus with hyperglycemia ([place]) Comments: Will add Janumet. Worked well and without side effects in the past.  Orders: -     sitaGLIPtin-metformin (JANUMET) 50-1000 MG tablet; Take 1 tablet by mouth 2 (two) times daily with a meal. -     Amb Referral to Bariatric Surgery  Hypertension associated with diabetes ([place]) -     Amb Referral to Bariatric Surgery  Obesity, diabetes, and hypertension syndrome (HCC) -     Amb Referral to Bariatric Surgery  PCOS (polycystic ovarian syndrome) -     Amb Referral to Bariatric Surgery  Mixed diabetic hyperlipidemia associated with type 2 diabetes mellitus ([place]), on Zocor -     Amb Referral to Bariatric Surgery  Sarcoidosis  Morbid obesity with BMI of 40.0-44.9, adult  ([place]) Comments: Will refer to [facility] for consideration of bariatric surgery.   Marland Kitchen COVID-19 Education: The signs and symptoms of COVID-19 were discussed with the patient and how to seek care for testing if needed. The importance of social distancing was discussed today. . Reviewed expectations re: course of current medical issues. . Discussed self-management of symptoms. . Outlined signs and symptoms indicating need for more acute intervention. . Patient verbalized understanding and all questions were answered. Marland Kitchen Health Maintenance issues including appropriate healthy diet, exercise, and smoking avoidance were discussed with patient. . See orders for this visit as documented in the electronic medical record.  Briscoe Deutscher, DO  Records requested if needed. Time spent: 25 minutes, of which >50% was spent in obtaining information about her symptoms, reviewing her previous labs, evaluations, and treatments, counseling her about her condition (please see the discussed topics above), and developing a plan to further investigate it; she had a number of questions which I addressed.

## 2018-09-04 ENCOUNTER — Ambulatory Visit (INDEPENDENT_AMBULATORY_CARE_PROVIDER_SITE_OTHER): Payer: Managed Care, Other (non HMO) | Admitting: Family Medicine

## 2018-09-04 ENCOUNTER — Other Ambulatory Visit: Payer: Self-pay

## 2018-09-04 ENCOUNTER — Encounter: Payer: Self-pay | Admitting: Family Medicine

## 2018-09-04 VITALS — Ht 62.75 in | Wt 225.0 lb

## 2018-09-04 DIAGNOSIS — I10 Essential (primary) hypertension: Secondary | ICD-10-CM

## 2018-09-04 DIAGNOSIS — Z6841 Body Mass Index (BMI) 40.0 and over, adult: Secondary | ICD-10-CM

## 2018-09-04 DIAGNOSIS — I152 Hypertension secondary to endocrine disorders: Secondary | ICD-10-CM

## 2018-09-04 DIAGNOSIS — E1169 Type 2 diabetes mellitus with other specified complication: Secondary | ICD-10-CM | POA: Diagnosis not present

## 2018-09-04 DIAGNOSIS — E1165 Type 2 diabetes mellitus with hyperglycemia: Secondary | ICD-10-CM | POA: Diagnosis not present

## 2018-09-04 DIAGNOSIS — E282 Polycystic ovarian syndrome: Secondary | ICD-10-CM | POA: Diagnosis not present

## 2018-09-04 DIAGNOSIS — E1159 Type 2 diabetes mellitus with other circulatory complications: Secondary | ICD-10-CM

## 2018-09-04 DIAGNOSIS — E782 Mixed hyperlipidemia: Secondary | ICD-10-CM

## 2018-09-04 DIAGNOSIS — E669 Obesity, unspecified: Secondary | ICD-10-CM

## 2018-09-04 DIAGNOSIS — D869 Sarcoidosis, unspecified: Secondary | ICD-10-CM

## 2018-09-04 MED ORDER — JANUMET 50-1000 MG PO TABS: 1.0000 | ORAL_TABLET | Freq: Two times a day (BID) | ORAL | 0 refills | Status: DC

## 2018-09-05 NOTE — Progress Notes (Signed)
[place] Name: Tanya Hardy, D.O. Family and Obesity Medicine [employer], [place]  To Whom It May Concern:  Tanya Hardy has been my patient since 06/2017. Tanya Hardy suffers from morbid obesity ICD-10 E66.01. After consultation with me, I am recommending that this patient should pursue bariatric surgery with [facility]. Tanya Hardy is a(n) 38 y.o. year old female who has a Body mass index of 40.18 kg/m.   Current Outpatient Medications:  .  citalopram (CELEXA) 20 MG tablet, Take 1 tablet (20 mg total) by mouth daily., Disp: 90 tablet, Rfl: 3 .  losartan (COZAAR) 50 MG tablet, Take 1 tablet (50 mg total) by mouth daily., Disp: 90 tablet, Rfl: 3 .  medroxyPROGESTERone (PROVERA) 5 MG tablet, Take one tablet a day for 5 days every other month if no spontaneous menses., Disp: 15 tablet, Rfl: 1 .  Semaglutide, 1 MG/DOSE, (OZEMPIC, 1 MG/DOSE,) 2 MG/1.5ML SOPN, Inject 1 mg into the skin once a week., Disp: 4 pen, Rfl: 6 .  simvastatin (ZOCOR) 10 MG tablet, Take 1 tablet (10 mg total) by mouth at bedtime., Disp: 90 tablet, Rfl: 3 .  sitaGLIPtin-metformin (JANUMET) 50-1000 MG tablet, Take 1 tablet by mouth 2 (two) times daily with a meal., Disp: 90 tablet, Rfl: 0  Tanya Hardy has tried multiple weight loss attempts including: Atkins, Slim Fast, Stress Management, Rickard Patience, Self-Directed Diet, Self-Directed Exercise , Formal Exercise, Caloric Restriction and Weight Watchers.  This prolonged history of obesity has contributed to her medical conditions. Patient Active Problem List   Diagnosis Date Noted  . Morbid obesity with BMI of 40.0-44.9, adult ([place]) 09/04/2018  . Mixed diabetic hyperlipidemia associated with type 2 diabetes mellitus ([place]), on Zocor 07/28/2017  . Obesity, diabetes, and hypertension syndrome ([place]) 07/28/2017  . Elevated LFTs 07/28/2017  . Macroalbuminuric diabetic nephropathy ([place]), on ARB 10/01/2014  . Secondary amenorrhea 09/30/2014  .  PCOS (polycystic ovarian syndrome) 09/30/2014  . Diabetes type 2, uncontrolled ([place]), on Ozempic 07/04/2013  . Hypertension associated with diabetes ([place]) 07/04/2013  . Sarcoidosis 10/06/2012   All of Tanya Hardy's previous weight loss efforts have failed. I believe bariatric surgery would be a benefit to this patient's quality of life, I also believe this patient is capable of adapting to the new lifestyle changes required of bariatric surgery  I recommend bariatric surgery based on these reasons. Should you have any questions or need clarification please do not hesitate to contact me.  Sincerely,  Tanya Deutscher, DO

## 2018-09-06 ENCOUNTER — Other Ambulatory Visit: Payer: Self-pay

## 2018-09-06 ENCOUNTER — Ambulatory Visit: Payer: Managed Care, Other (non HMO) | Admitting: Internal Medicine

## 2018-09-06 ENCOUNTER — Encounter: Payer: Self-pay | Admitting: Internal Medicine

## 2018-09-06 VITALS — BP 164/116 | HR 98 | Temp 98.9°F | Ht 63.0 in | Wt 229.2 lb

## 2018-09-06 DIAGNOSIS — E781 Pure hyperglyceridemia: Secondary | ICD-10-CM

## 2018-09-06 DIAGNOSIS — E1165 Type 2 diabetes mellitus with hyperglycemia: Secondary | ICD-10-CM

## 2018-09-06 DIAGNOSIS — E1129 Type 2 diabetes mellitus with other diabetic kidney complication: Secondary | ICD-10-CM | POA: Diagnosis not present

## 2018-09-06 DIAGNOSIS — Z794 Long term (current) use of insulin: Secondary | ICD-10-CM | POA: Diagnosis not present

## 2018-09-06 DIAGNOSIS — R809 Proteinuria, unspecified: Secondary | ICD-10-CM

## 2018-09-06 DIAGNOSIS — IMO0001 Reserved for inherently not codable concepts without codable children: Secondary | ICD-10-CM

## 2018-09-06 LAB — GLUCOSE, POCT (MANUAL RESULT ENTRY): POC Glucose: 286 mg/dl — AB (ref 70–99)

## 2018-09-06 MED ORDER — FARXIGA 10 MG PO TABS: 10.0000 mg | ORAL_TABLET | Freq: Every day | ORAL | 6 refills | Status: DC

## 2018-09-06 MED ORDER — FARXIGA 5 MG PO TABS: 5.0000 mg | ORAL_TABLET | Freq: Every day | ORAL | 0 refills | Status: DC

## 2018-09-06 NOTE — Patient Instructions (Addendum)
-   Do Not Start Janumet  - Continue Ozempic 1 mg weekly  - Start Farxiga 5 mg, 1 tablet daily , if no side effects after 1 month, take the 10 mg dose    - try and check sugar before breakfast and bedtime   - Choose healthy, lower carb lower calorie snacks: toss salad, cooked vegetables, cottage cheese, peanut butter, low fat cheese / string cheese, lower sodium deli meat, tuna salad or chicken salad

## 2018-09-06 NOTE — Progress Notes (Signed)
Name: Tanya Hardy  MRN/ DOB: [id number], 26-Sep-1980   Age/ Sex: 38 y.o., female    PCP: Helane RimaWallace, Erica, DO   Reason for Endocrinology Evaluation: Type 2 Diabetes Mellitus     Date of Initial Endocrinology Visit: 09/06/2018     PATIENT IDENTIFIER: Tanya Hardy is a 38 y.o. female with a past medical history of T2DM, PCOS and Dyslipidemia  The patient presented for initial endocrinology clinic visit on 09/06/2018 for consultative assistance with her diabetes management.    HPI: Tanya Hardy was    Diagnosed with DM in 2010 Prior Medications tried/Intolerance: Metformin- GI upset, Glipizide- no reaction, Actos- no side effects. Insulin , byetta Currently checking blood sugars 0 x / day.  Hypoglycemia episodes : no           Hemoglobin A1c has ranged from 7.5% in 2019, peaking at 10.4% in 2020. Patient required assistance for hypoglycemia: no Patient has required hospitalization within the last 1 year from hyper or hypoglycemia: no  In terms of diet, the patient is not sure how many melas or snacks she eats, the pt avoids sugar-sweetened beverages.    HOME DIABETES REGIMEN: Janumet 50-1000 mg BID- has not started yet  Ozempic 1 mg weekly    Statin: yes ACE-I/ARB: yes Prior Diabetic Education: yes   METER DOWNLOAD SUMMARY: Did not bring    DIABETIC COMPLICATIONS: Microvascular complications:    Denies: CKD but has microalbuminuria , retinopathy, neuropathy   Last eye exam: Completed 04/2018   Macrovascular complications:     Denies: CAD, PVD, CVA   PAST HISTORY: Past Medical History:  Past Medical History:  Diagnosis Date   Amenorrhea    Anxiety    Chronic sinusitis    Complication of anesthesia    anesthesia not strong enough, fought during surgery   Depression 2011   treated at the time, no longer on meds   Diabetes mellitus without complication (HCC)    diet controlled   Headache(784.0)    migraines (rare now)   Hypertension     Infertility, female    Obesity    PCOS (polycystic ovarian syndrome)    Shortness of breath    going upstairs, being worked up for sarcoidoisi   Past Surgical History:  Past Surgical History:  Procedure Laterality Date   NASAL SINUS SURGERY N/A 09/15/2012   Procedure: BILATERAL ENDOSCOPIC MAXILLARY ANTROSTOMY/ETHMOIDECTOMY/FRONTAL SINUS SURGERY and sphenoidotomy;  Surgeon: Serena ColonelJefry Rosen, MD;  [facility];  Service: ENT;  Laterality: N/A;   VIDEO BRONCHOSCOPY Bilateral 05/15/2012   pt styated not completed      Social History:  reports that she quit smoking about 6 years ago. Her smoking use included cigarettes and e-cigarettes. She has a 2.75 pack-year smoking history. She has never used smokeless tobacco. She reports previous alcohol use. She reports that she does not use drugs. Family History:  Family History  Problem Relation Age of Onset   Hypertension Mother    Sarcoidosis Brother    Hypertension Brother    Uterine cancer Paternal Aunt    Brain cancer Paternal Grandmother    Diabetes Paternal Grandmother      HOME MEDICATIONS: Allergies as of 09/06/2018      Reactions   Dexamethasone Sodium Phosphate Other (See Comments)   Pt states after receiving injection had severe nosebleeds, bruising, blood blister on lip      Medication List       Accurate as of September 06, 2018  4:08 PM. If you have  any questions, ask your nurse or doctor.        citalopram 20 MG tablet Commonly known as: CELEXA Take 1 tablet (20 mg total) by mouth daily.   Janumet 50-1000 MG tablet Generic drug: sitaGLIPtin-metformin Take 1 tablet by mouth 2 (two) times daily with a meal.   losartan 50 MG tablet Commonly known as: COZAAR Take 1 tablet (50 mg total) by mouth daily.   medroxyPROGESTERone 5 MG tablet Commonly known as: Provera Take one tablet a day for 5 days every other month if no spontaneous menses.   Semaglutide (1 MG/DOSE) 2 MG/1.5ML Sopn Commonly known as: Ozempic (1  MG/DOSE) Inject 1 mg into the skin once a week.   simvastatin 10 MG tablet Commonly known as: ZOCOR Take 1 tablet (10 mg total) by mouth at bedtime.        ALLERGIES: Allergies  Allergen Reactions   Dexamethasone Sodium Phosphate Other (See Comments)    Pt states after receiving injection had severe nosebleeds, bruising, blood blister on lip     REVIEW OF SYSTEMS: A comprehensive ROS was conducted with the patient and is negative except as per HPI and below:  Review of Systems  Constitutional: Negative for fever and weight loss.  HENT: Negative for congestion and sore throat.   Eyes: Positive for blurred vision. Negative for pain.  Respiratory: Negative for cough and shortness of breath.   Cardiovascular: Negative for chest pain and palpitations.  Genitourinary: Negative for frequency.  Neurological: Negative for tingling and tremors.  Endo/Heme/Allergies: Negative for polydipsia.  Psychiatric/Behavioral: Negative for depression. The patient is not nervous/anxious.       OBJECTIVE:   VITAL SIGNS: BP (!) 164/116 (BP Location: Left Arm, Patient Position: Sitting, Cuff Size: Normal)    Pulse 98    Temp 98.9 F (37.2 C)    Ht 5\' 3"  (1.6 m)    Wt 229 lb 3.2 oz (104 kg)    LMP 09/04/2018    SpO2 97%    BMI 40.60 kg/m    PHYSICAL EXAM:  General: Pt appears well and is in NAD  Hydration: Well-hydrated with moist mucous membranes and good skin turgor  HEENT: Head: Unremarkable with good dentition. Oropharynx clear without exudate.  Eyes: External eye exam normal without stare, lid lag or exophthalmos.  EOM intact.   Neck: General: Supple without adenopathy or carotid bruits. Thyroid: Thyroid size normal.  No goiter or nodules appreciated. No thyroid bruit.  Lungs: Clear with good BS bilat with no rales, rhonchi, or wheezes  Heart: RRR with normal S1 and S2 and no gallops; no murmurs; no rub  Abdomen: Normoactive bowel sounds, soft, nontender, without masses or organomegaly  palpable  Extremities:  Lower extremities - No pretibial edema. No lesions Pt with very muscular LE and somewhat muscular UE with prominent veins   Skin: Normal texture and temperature to palpation. No rash noted  Neuro: MS is good with appropriate affect, pt is alert and Ox3    DM foot exam: 09/06/2018  The skin of the feet is intact without sores or ulcerations. The pedal pulses are 2+ on right and 2+ on left. The sensation is intact to a screening 5.07, 10 gram monofilament bilaterally   DATA REVIEWED:  Lab Results  Component Value Date   HGBA1C 10.4 (H) 07/10/2018   HGBA1C 7.5 (A) 12/30/2017   HGBA1C 7.9 (A) 09/28/2017   Lab Results  Component Value Date   MICROALBUR 86.4 (H) 12/30/2017   LDLCALC Comment 06/22/2017  CREATININE 1.08 09/28/2017   Lab Results  Component Value Date   MICRALBCREAT 87.5 (H) 12/30/2017    Lab Results  Component Value Date   CHOL 145 09/28/2017   HDL 29.70 (L) 09/28/2017   LDLCALC Comment 06/22/2017   LDLDIRECT 79.0 09/28/2017   TRIG 287.0 (H) 09/28/2017   CHOLHDL 5 09/28/2017       Results for Tanya Hardy, Tanya Hardy (MRN [id number]) as of 09/06/2018 13:39  Ref. Range 12/30/2017 14:04  MICROALB/CREAT RATIO Latest Ref Range: 0.0 - 30.0 mg/g 87.5 (H)   ASSESSMENT / PLAN / RECOMMENDATIONS:   1) Type 2 Diabetes Mellitus, Poorly Controlled, With microalbuminuria complications - Most recent A1c of 10.4 %. Goal A1c < 7.0 %.    Plan: GENERAL: I have discussed with the patient the pathophysiology of diabetes. We went over the natural progression of the disease. We talked about both insulin resistance and insulin deficiency. We stressed the importance of lifestyle changes including diet and exercise. I explained the complications associated with diabetes including retinopathy, nephropathy, neuropathy as well as increased risk of cardiovascular disease. We went over the benefit seen with glycemic control.    I explained to the patient that diabetic  patients are at higher than normal risk for amputations.  The patient admits to dietary indiscretions, pt also states she knows she will not be able to keep up with taking twice daily pills, she also tells me she knows she will not be able to check her glucose 3x daily, she has done this for 10 yrs and knows herself. I have explained to her that unfortunately other then a GLP-1 agonist that's given weekly ( and she is already on) we don't have any other formulations that are given this way.  I also have advised her to avoid starting Janumet, it is not recommended to combine GLP-1 agonist with DPP-4 inhibitos  She is intolerant to metformin, she liked actos but concerned about side effects . She would not be able to keep up with daily insulin injections (has tried them before). Pt agreed to trying an SGLT-2 inhibitor after discussing the risks and benefit , we discussed risk of genital infections.    MEDICATIONS: - Do Not Start Janumet  - Continue Ozempic 1 mg weekly  - Start Farxiga 5 mg, 1 tablet daily , if no side effects after 1 month, take the 10 mg dose  - BMP on next visit     EDUCATION / INSTRUCTIONS:  BG monitoring instructions: Patient is instructed to check her blood sugars 2 times a day, fasting and bedtime.  Call [facility] if: BG persistently < 70 or > 300.  I reviewed the Rule of 15 for the treatment of hypoglycemia in detail with the patient. Literature supplied.   2) Diabetic complications:   Eye: Does not have known diabetic retinopathy.   Neuro/ Feet: Does not have known diabetic peripheral neuropathy.  Renal: Patient does not have known baseline CKD. She is  on an ACEI/ARB at present.   3) Hypertiglyceridemia: Patient is on simvastatin. If TG remains elevated will consider adding fenofibrate    4) Hypertension: Above  goal of < 140/90 mmHg. On Losartan, I am hoping the Marcelline DeistFarxiga will help with this.    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 I would like for her to get a second opinion on this. We discussed that the management is the same , targeting the metabolic abnormality for example in diabetes metformin and actos has been shows to help with diabetes control, insulin not so much, fenofibrates for elevated Tg levels, etc. We again emphasized lifestyle changes, I have encouraged her to eat foods that are high in fiber and omega -3 but low in other fats. We also discussed the importance of exercise. Pts with FPLD have low leptin but metreleptin is not FDA approved for partial lipodystrophy only for generalized.      Addendum: Discussed above with pt on 09/07/18 at noon. We both agreed to research on nearby centers for a referral.   F/u in 2 months     Signed electronically by: Mack Guise, MD  [facility]  [address] [place], [place] [address] Phone: [phone] FAX: [phone]   CC: [address] Deut[address], [place] [address] Phone: [phone]  Fax: [phone]    Return to Endocrinology clinic as below: Future Appointments  Date Time Provider [place]  11/07/2018  4:00 PM Briscoe Deutscher, DO [facility]  11/22/2018  2:45 PM Salvadore Dom, MD [place] None  08/22/2019  3:30 PM Salvadore Dom, MD [place] None

## 2018-09-07 ENCOUNTER — Encounter: Payer: Self-pay | Admitting: Internal Medicine

## 2018-09-07 ENCOUNTER — Encounter: Payer: Self-pay | Admitting: Family Medicine

## 2018-09-07 ENCOUNTER — Encounter: Payer: Self-pay | Admitting: Obstetrics and Gynecology

## 2018-09-07 ENCOUNTER — Telehealth: Payer: Self-pay | Admitting: Obstetrics and Gynecology

## 2018-09-07 DIAGNOSIS — E1129 Type 2 diabetes mellitus with other diabetic kidney complication: Secondary | ICD-10-CM | POA: Insufficient documentation

## 2018-09-07 DIAGNOSIS — E781 Pure hyperglyceridemia: Secondary | ICD-10-CM | POA: Insufficient documentation

## 2018-09-07 DIAGNOSIS — R809 Proteinuria, unspecified: Secondary | ICD-10-CM | POA: Insufficient documentation

## 2018-09-07 NOTE — Telephone Encounter (Signed)
I saw an endocrinologist and although she needs further testing and proof.. 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

## 2018-09-07 NOTE — Telephone Encounter (Signed)
Spoke with patient. She saw endocrinologist yesterday and was told may have rare disorder called partial lipodystrophy. Patient just wanted Dr.Jertson to know. She will be having further testing.  Routed to provider to sign and close.

## 2018-09-11 ENCOUNTER — Encounter: Payer: Self-pay | Admitting: Internal Medicine

## 2018-09-11 NOTE — Telephone Encounter (Deleted)
Can you please ask the patient to have a copy of her Endocrinology visit sent here. Thanks.

## 2018-10-03 ENCOUNTER — Ambulatory Visit (HOSPITAL_COMMUNITY): Payer: Managed Care, Other (non HMO)

## 2018-10-05 ENCOUNTER — Ambulatory Visit (HOSPITAL_COMMUNITY): Payer: Managed Care, Other (non HMO)

## 2018-10-13 ENCOUNTER — Encounter (HOSPITAL_COMMUNITY): Payer: Managed Care, Other (non HMO)

## 2018-10-13 ENCOUNTER — Ambulatory Visit (HOSPITAL_COMMUNITY): Payer: Managed Care, Other (non HMO) | Attending: Obstetrics and Gynecology

## 2018-10-13 ENCOUNTER — Ambulatory Visit (HOSPITAL_COMMUNITY): Payer: Managed Care, Other (non HMO)

## 2018-10-30 ENCOUNTER — Other Ambulatory Visit: Payer: Self-pay

## 2018-11-01 ENCOUNTER — Encounter: Payer: Self-pay | Admitting: Internal Medicine

## 2018-11-01 ENCOUNTER — Ambulatory Visit (INDEPENDENT_AMBULATORY_CARE_PROVIDER_SITE_OTHER): Payer: Managed Care, Other (non HMO) | Admitting: Internal Medicine

## 2018-11-01 DIAGNOSIS — E1165 Type 2 diabetes mellitus with hyperglycemia: Secondary | ICD-10-CM | POA: Diagnosis not present

## 2018-11-01 DIAGNOSIS — IMO0001 Reserved for inherently not codable concepts without codable children: Secondary | ICD-10-CM

## 2018-11-01 DIAGNOSIS — Z794 Long term (current) use of insulin: Secondary | ICD-10-CM

## 2018-11-01 NOTE — Progress Notes (Signed)
Virtual Visit via Telephone Note  I connected with Tanya Hardy on 11/01/18 at 3:40 pm by telephone and verified that I am speaking with the correct person using two identifiers.   I discussed the limitations, risks, security and privacy concerns of performing an evaluation and management service by telephone and the availability of in person appointments. I also discussed with the patient that there may be a patient responsible charge related to this service. The patient expressed understanding and agreed to proceed.  -Location of the patient : Home -Location of the provider : Office -The names of all persons participating in the telemedicine service : Pt and myself           Name: Tanya Hardy  Age/ Sex: 38 y.o., female   MRN/ DOB: [id number], [birth year]     PCP: Helane Rima, Tanya Hardy   Reason for Hardy Evaluation: Type 2 Diabetes Mellitus  Initial Endocrine Consultative Visit: 09/06/18    PATIENT IDENTIFIER: Tanya Hardy is a 38 y.o. female with a past medical history of T2DM, PCOS and Dyslipidemia . The patient has followed with [facility] clinic since 09/06/2018 for consultative assistance with management of her diabetes.  DIABETIC HISTORY:  Tanya Hardy was diagnosed with T2DM in 2010. She has tried multiple oral glycemic agents in the past ( Metformin - GI upset, Glipizide - No reaction, Actos-no reaction).She also has been on insulin and byetta in the past. Her hemoglobin A1c has ranged from 7.5% in 2019, peaking at 10.4% in 2020.  On her initial visit to our clinic her A1c was 10.4% . She was on Janumet and Ozempic 1 mg . We stopped Januvia and she did not want to tey plain metformin due to prior intolerance, we continued ozempic and started [place].  SUBJECTIVE:   During the last visit (09/06/2018): A1c was 10.4% . She was on Janumet and Ozempic 1 mg . We stopped Januvia and she did not want to tey plain metformin due to prior intolerance, we  continued ozempic and started [place].    Today (11/01/2018): Tanya Hardy  She checks her blood sugars 2 times daily, preprandial to breakfast and during the day. The patient has not had hypoglycemic episodes since the last clinic visit. Otherwise, the patient has not required any recent emergency interventions for hypoglycemia and has not had recent hospitalizations secondary to hyper or hypoglycemic episodes.    ROS: As per HPI and as detailed below: Review of Systems  Constitutional: Positive for weight loss. Negative for fever.  Respiratory: Positive for cough. Negative for shortness of breath.   Cardiovascular: Negative for chest pain and palpitations.  Gastrointestinal: Negative for nausea and vomiting.      HOME DIABETES REGIMEN:  Ozempic 1 mg weekly  Farxiga 10 mg daily     GLUCOSE LOG :  Fasting 150-200 mg/dL  During the day 646-803 mg/dL       HISTORY:  Past Medical History:  Past Medical History:  Diagnosis Date  . Amenorrhea   . Anxiety   . Chronic sinusitis   . Complication of anesthesia    anesthesia not strong enough, fought during surgery  . Depression 2011   treated at the time, no longer on meds  . Diabetes mellitus without complication (HCC)    diet controlled  . Headache(784.0)    migraines (rare now)  . Hypertension   . Infertility, female   . Obesity   . PCOS (polycystic ovarian syndrome)   . Shortness of breath  going upstairs, being worked up for sarcoidoisi   Past Surgical History:  Past Surgical History:  Procedure Laterality Date  . NASAL SINUS SURGERY N/A 09/15/2012   Procedure: BILATERAL ENDOSCOPIC MAXILLARY ANTROSTOMY/ETHMOIDECTOMY/FRONTAL SINUS SURGERY and sphenoidotomy;  Surgeon: Tanya Colonel, Tanya Hardy;  Location: [facility] OR;  Service: ENT;  Laterality: N/A;  . VIDEO BRONCHOSCOPY Bilateral 05/15/2012   pt styated not completed    Social History:  reports that she quit smoking about 6 years ago. Her smoking use included cigarettes and  e-cigarettes. She has a 2.75 pack-year smoking history. She has never used smokeless tobacco. She reports previous alcohol use. She reports that she does not use drugs. Family History:  Family History  Problem Relation Age of Onset  . Hypertension Mother   . Sarcoidosis Brother   . Hypertension Brother   . Uterine cancer Paternal Aunt   . Brain cancer Paternal Grandmother   . Diabetes Paternal Grandmother      HOME MEDICATIONS: Allergies as of 11/01/2018      Reactions   Dexamethasone Sodium Phosphate Other (See Comments)   Pt states after receiving injection had severe nosebleeds, bruising, blood blister on lip      Medication List       Accurate as of November 01, 2018  4:07 PM. If you have any questions, ask your nurse or doctor.        citalopram 20 MG tablet Commonly known as: CELEXA Take 1 tablet (20 mg total) by mouth daily.   Farxiga 5 MG Tabs tablet Generic drug: dapagliflozin propanediol Take 5 mg by mouth daily.   Farxiga 10 MG Tabs tablet Generic drug: dapagliflozin propanediol Take 10 mg by mouth daily.   losartan 50 MG tablet Commonly known as: COZAAR Take 1 tablet (50 mg total) by mouth daily.   medroxyPROGESTERone 5 MG tablet Commonly known as: Provera Take one tablet a day for 5 days every other month if no spontaneous menses.   Semaglutide (1 MG/DOSE) 2 MG/1.5ML Sopn Commonly known as: Ozempic (1 MG/DOSE) Inject 1 mg into the skin once a week.   simvastatin 10 MG tablet Commonly known as: ZOCOR Take 1 tablet (10 mg total) by mouth at bedtime.        DATA REVIEWED:  Lab Results  Component Value Date   HGBA1C 10.4 (H) 07/10/2018   HGBA1C 7.5 (A) 12/30/2017   HGBA1C 7.9 (A) 09/28/2017   Lab Results  Component Value Date   MICROALBUR 86.4 (H) 12/30/2017   LDLCALC Comment 06/22/2017   CREATININE 1.08 09/28/2017   Lab Results  Component Value Date   MICRALBCREAT 87.5 (H) 12/30/2017     Lab Results  Component Value Date    CHOL 145 09/28/2017   HDL 29.70 (L) 09/28/2017   LDLCALC Comment 06/22/2017   LDLDIRECT 79.0 09/28/2017   TRIG 287.0 (H) 09/28/2017   CHOLHDL 5 09/28/2017         ASSESSMENT / PLAN / RECOMMENDATIONS:   1) Type 2 Diabetes Mellitus, Poorly controlled, With microalbuminuria - Most recent A1c of  10.4%. Goal A1c < 7.0 %.    - She is trying to Tanya Hardy better with lifestyle changes and glucose checks - Her sugars seem to be improving but continue to be above goal during the day - We again discussed her prior reported intolerance to Metformin. She declines insulin, actos and glipizide. She is not a candidate for DPP-4 inhibitors as she is already on GLP-1 agonist.  - She did have yeast infection early on with  Tanya Hardy but this has resolved - Pt opted to continue with this regimen and will touch base with surgery about her referral for weight loss.     MEDICATIONS:  Continue Ozempic 1 mg weekly  Continue Farxiga 10 mg daily   EDUCATION / INSTRUCTIONS:  BG monitoring instructions: Patient is instructed to check her blood sugars 2 times a day, before meals and bedtime   Call Tanya Hardy clinic if: BG persistently < 70 or > 300. . I reviewed the Rule of 15 for the treatment of hypoglycemia in detail with the patient. Literature supplied.   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 I would like for her to get a second opinion on this. We discussed that the management is the same , targeting the metabolic abnormality for example in diabetes metformin and actos has been shows to help with diabetes control, insulin not so much, fenofibrates for elevated Tg levels, etc. We again emphasized lifestyle changes. Pts with FPLD have low leptin but metreleptin is not  FDA approved for partial lipodystrophy only for generalized. I have reached out to [facility] sometime in 09/2018 and left a message for the director of the Endocrine clinic with no response.    I discussed the assessment and treatment plan with the patient. The patient was provided an opportunity to ask questions and all were answered. The patient agreed with the plan and demonstrated an understanding of the instructions.   The patient was advised to call back or seek an in-person evaluation if the symptoms worsen or if the condition fails to improve as anticipated.  Time spent with the patient was 13 minutes    F/U in 3- 4 months     Signed electronically by: Tanya Guise, Tanya Hardy  [facility]  [address] [place], [place] [address] Phone: [phone] FAX: [phone]   CC: Tanya Hardy, [place] [address] Phone: [phone]  Fax: [phone]  Return to Hardy clinic as below: Future Appointments  Date Time Provider [place]  11/07/2018  4:00 PM Tanya Deutscher, Tanya Hardy [facility]  11/22/2018  2:45 PM Salvadore Dom, Tanya Hardy [place] None  08/22/2019  3:30 PM Salvadore Dom, Tanya Hardy [place] None

## 2018-11-07 ENCOUNTER — Ambulatory Visit (INDEPENDENT_AMBULATORY_CARE_PROVIDER_SITE_OTHER): Payer: Managed Care, Other (non HMO) | Admitting: Family Medicine

## 2018-11-07 ENCOUNTER — Encounter: Payer: Self-pay | Admitting: Family Medicine

## 2018-11-07 VITALS — Ht 63.0 in | Wt 227.0 lb

## 2018-11-07 DIAGNOSIS — E782 Mixed hyperlipidemia: Secondary | ICD-10-CM

## 2018-11-07 DIAGNOSIS — Z6841 Body Mass Index (BMI) 40.0 and over, adult: Secondary | ICD-10-CM

## 2018-11-07 DIAGNOSIS — E1169 Type 2 diabetes mellitus with other specified complication: Secondary | ICD-10-CM | POA: Diagnosis not present

## 2018-11-07 DIAGNOSIS — E1159 Type 2 diabetes mellitus with other circulatory complications: Secondary | ICD-10-CM

## 2018-11-07 DIAGNOSIS — E1129 Type 2 diabetes mellitus with other diabetic kidney complication: Secondary | ICD-10-CM | POA: Diagnosis not present

## 2018-11-07 DIAGNOSIS — I10 Essential (primary) hypertension: Secondary | ICD-10-CM

## 2018-11-07 DIAGNOSIS — D869 Sarcoidosis, unspecified: Secondary | ICD-10-CM

## 2018-11-07 DIAGNOSIS — I152 Hypertension secondary to endocrine disorders: Secondary | ICD-10-CM

## 2018-11-07 DIAGNOSIS — R809 Proteinuria, unspecified: Secondary | ICD-10-CM

## 2018-11-07 NOTE — Patient Instructions (Signed)
[email or web address]

## 2018-11-07 NOTE — Progress Notes (Signed)
Virtual Visit via Video   Due to the COVID-19 pandemic, this visit was completed with telemedicine (audio/video) technology to reduce patient and provider exposure as well as to preserve personal protective equipment.   I connected with Tanya Hardy by a video enabled telemedicine application and verified that I am speaking with the correct person using two identifiers. Location patient: Home Location provider: [facility], Office Persons participating in the virtual visit: Charlese Gruetzmacher, Briscoe Deutscher, DO Lonell Grandchild, CMA acting as scribe for Dr. Briscoe Deutscher.   I discussed the limitations of evaluation and management by telemedicine and the availability of in person appointments. The patient expressed understanding and agreed to proceed.  Care Team   Patient Care Team: Briscoe Deutscher, DO as PCP - General (Family Medicine) Salvadore Dom, MD as Consulting Physician (Obstetrics and Gynecology) Tanda Rockers, MD as Consulting Physician (Pulmonary Disease)  Subjective:   HPI: Patient presenting to rediscuss bariatric surgery track.  She needs confirmation regarding how to continue.  Please see previous note for more details.  She wonders if she needs a sleep study for surgery.  BMI over 40.  History of non-insulin-dependent diabetes.  Review of Systems  Constitutional: Negative for chills and fever.  HENT: Negative for hearing loss and tinnitus.   Eyes: Negative for blurred vision and double vision.  Respiratory: Negative for cough and wheezing.   Cardiovascular: Negative for chest pain, palpitations and leg swelling.  Gastrointestinal: Negative for heartburn and nausea.  Genitourinary: Negative for dysuria and urgency.  Neurological: Negative for dizziness and headaches.  Psychiatric/Behavioral: Negative for depression and suicidal ideas.    Patient Active Problem List   Diagnosis Date Noted  . Type 2 diabetes mellitus with microalbuminuria, without long-term  current use of insulin ([place]) 09/07/2018  . Hypertriglyceridemia 09/07/2018  . Morbid obesity ([place]) 09/07/2018  . Morbid obesity with BMI of 40.0-44.9, adult ([place]) 09/04/2018  . Mixed diabetic hyperlipidemia associated with type 2 diabetes mellitus ([place]), on Zocor 07/28/2017  . Obesity, diabetes, and hypertension syndrome ([place]) 07/28/2017  . Elevated LFTs 07/28/2017  . 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 Secondary amenorrhea 09/30/2014  . PCOS (polycystic ovarian syndrome) 09/30/2014  . Diabetes type 2, uncontrolled ([place]), on Ozempic 07/04/2013  . Hypertension associated with diabetes ([place]) 07/04/2013  . Sarcoidosis 10/06/2012    Social History   Tobacco Use  . Smoking status: Former Smoker    Packs/day: 0.25    Years: 11.00    Pack years: 2.75    Types: Cigarettes, E-cigarettes    Quit date: 04/07/2012    Years since quitting: 6.5  . Smokeless tobacco: Never Used  Substance Use Topics  . Alcohol use: Not Currently   Current Outpatient Medications:  .  citalopram (CELEXA) 20 MG tablet, Take 1 tablet (20 mg total) by mouth daily., Disp: 90 tablet, Rfl: 3 .  dapagliflozin propanediol (FARXIGA) 10 MG TABS tablet, Take 10 mg by mouth daily., Disp: 30 tablet, Rfl: 6 .  dapagliflozin propanediol (FARXIGA) 5 MG TABS tablet, Take 5 mg by mouth daily., Disp: 30 tablet, Rfl: 0 .  losartan (COZAAR) 50 MG tablet, Take 1 tablet (50 mg total) by mouth daily., Disp: 90 tablet, Rfl: 3 .  medroxyPROGESTERone (PROVERA) 5 MG tablet, Take one tablet a day for 5 days every other month if no spontaneous menses., Disp: 15 tablet, Rfl: 1 .  Semaglutide, 1 MG/DOSE, (OZEMPIC, 1 MG/DOSE,) 2 MG/1.5ML SOPN, Inject 1 mg into the skin once a week., Disp:  4 pen, Rfl: 6 .  simvastatin (ZOCOR) 10 MG tablet, Take 1 tablet (10 mg total) by mouth at bedtime., Disp: 90 tablet, Rfl: 3  Allergies  Allergen Reactions  . Dexamethasone Sodium Phosphate Other (See Comments)    Pt states after  receiving injection had severe nosebleeds, bruising, blood blister on lip   Objective:   VITALS: Per patient if applicable, see vitals. GENERAL: Alert, appears well and in no acute distress. HEENT: Atraumatic, conjunctiva clear, no obvious abnormalities on inspection of external nose and ears. NECK: Normal movements of the head and neck. CARDIOPULMONARY: No increased WOB. Speaking in clear sentences. I:E ratio WNL.  MS: Moves all visible extremities without noticeable abnormality. PSYCH: Pleasant and cooperative, well-groomed. Speech normal rate and rhythm. Affect is appropriate. Insight and judgement are appropriate. Attention is focused, linear, and appropriate.  NEURO: CN grossly intact. Oriented as arrived to appointment on time with no prompting. Moves both UE equally.  SKIN: No obvious lesions, wounds, erythema, or cyanosis noted on face or hands.  Depression screen [facility] 2/9 09/04/2018 07/18/2018 02/28/2018  Decreased Interest 0 1 1  Down, Depressed, Hopeless 0 0 0  PHQ - 2 Score 0 1 1  Altered sleeping 0 0 0  Tired, decreased energy 0 0 1  Change in appetite 3 0 0  Feeling bad or failure about yourself  0 0 0  Trouble concentrating 0 0 0  Moving slowly or fidgety/restless 0 0 0  Suicidal thoughts 0 0 0  PHQ-9 Score 3 1 2   Difficult doing work/chores Not difficult at all Not difficult at all Not difficult at all    Assessment and Plan:   Tanya Hardy was seen today for follow-up.  Diagnoses and all orders for this visit:  Mixed diabetic hyperlipidemia associated with type 2 diabetes mellitus (HCC), on Zocor  Morbid obesity with BMI of 40.0-44.9, adult [facility]) Comments: Please see Bariatric letter in last visit note. Discussed directions to [facility] website and paperwork.   Type 2 diabetes mellitus with microalbuminuria, without long-term current use of insulin (HCC)  Hypertension associated with diabetes (HCC)  Sarcoidosis    . COVID-19 Education: The signs and symptoms of  COVID-19 were discussed with the patient and how to seek care for testing if needed. The importance of social distancing was discussed today. . Reviewed expectations re: course of current medical issues. . Discussed self-management of symptoms. . Outlined signs and symptoms indicating need for more acute intervention. . Patient verbalized understanding and all questions were answered. [facility] Health Maintenance issues including appropriate healthy diet, exercise, and smoking avoidance were discussed with patient. . See orders for this visit as documented in the electronic medical record.  Marland Kitchen, DO  Records requested if needed. Time spent: 15 minutes, of which >50% was spent in obtaining information about her symptoms, reviewing her previous labs, evaluations, and treatments, counseling her about her condition (please see the discussed topics above), and developing a plan to further investigate it; she had a number of questions which I addressed.

## 2018-11-22 ENCOUNTER — Ambulatory Visit: Payer: Managed Care, Other (non HMO) | Admitting: Obstetrics and Gynecology

## 2018-12-04 ENCOUNTER — Encounter: Payer: Self-pay | Admitting: Obstetrics and Gynecology

## 2018-12-04 ENCOUNTER — Ambulatory Visit: Payer: Managed Care, Other (non HMO) | Admitting: Obstetrics and Gynecology

## 2018-12-04 NOTE — Progress Notes (Deleted)
GYNECOLOGY  VISIT   HPI: 38 y.o.   Legally Separated Black or African American Not Hispanic or Latino  female   [id number] with No LMP recorded. (Menstrual status: Irregular Periods).   here for retesting for Chlamydia. She was diagnosed with Chlamydia on routine screening in 7/20. Negative HIV, RPR, trich, GC testing at the same visit as the + chlamydia    GYNECOLOGIC HISTORY: No LMP recorded. (Menstrual status: Irregular Periods). Contraception:*** Menopausal hormone therapy: ***        OB History    Gravida  1   Para      Term      Preterm      AB  1   Living        SAB  1   TAB      Ectopic      Multiple      Live Births                 Patient Active Problem List   Diagnosis Date Noted  . Type 2 diabetes mellitus with microalbuminuria, without long-term current use of insulin (HCC) 09/07/2018  . Hypertriglyceridemia 09/07/2018  . Morbid obesity (HCC) 09/07/2018  . Morbid obesity with BMI of 40.0-44.9, adult (HCC) 09/04/2018  . Mixed diabetic hyperlipidemia associated with type 2 diabetes mellitus (HCC), on Zocor 07/28/2017  . Obesity, diabetes, and hypertension syndrome (HCC) 07/28/2017  . Elevated LFTs 07/28/2017  . Macroalbuminuric diabetic nephropathy (HCC), on ARB 10/01/2014  . Secondary amenorrhea 09/30/2014  . PCOS (polycystic ovarian syndrome) 09/30/2014  . Diabetes type 2, uncontrolled (HCC), on Ozempic 07/04/2013  . Hypertension associated with diabetes (HCC) 07/04/2013  . Sarcoidosis 10/06/2012    Past Medical History:  Diagnosis Date  . Amenorrhea   . Anxiety   . Chronic sinusitis   . Complication of anesthesia    anesthesia not strong enough, fought during surgery  . Depression 2011   treated at the time, no longer on meds  . Diabetes mellitus without complication (HCC)    diet controlled  . Headache(784.0)    migraines (rare now)  . Hypertension   . Infertility, female   . Obesity   . PCOS (polycystic ovarian syndrome)   .  Shortness of breath    going upstairs, being worked up for sarcoidoisi    Past Surgical History:  Procedure Laterality Date  . NASAL SINUS SURGERY N/A 09/15/2012   Procedure: BILATERAL ENDOSCOPIC MAXILLARY ANTROSTOMY/ETHMOIDECTOMY/FRONTAL SINUS SURGERY and sphenoidotomy;  Surgeon: Serena ColonelJefry Rosen, MD;  [facility];  Service: ENT;  Laterality: N/A;  . VIDEO BRONCHOSCOPY Bilateral 05/15/2012   pt styated not completed    Current Outpatient Medications  Medication Sig Dispense Refill  . citalopram (CELEXA) 20 MG tablet Take 1 tablet (20 mg total) by mouth daily. 90 tablet 3  . dapagliflozin propanediol (FARXIGA) 10 MG TABS tablet Take 10 mg by mouth daily. 30 tablet 6  . losartan (COZAAR) 50 MG tablet Take 1 tablet (50 mg total) by mouth daily. 90 tablet 3  . medroxyPROGESTERone (PROVERA) 5 MG tablet Take one tablet a day for 5 days every other month if no spontaneous menses. 15 tablet 1  . Semaglutide, 1 MG/DOSE, (OZEMPIC, 1 MG/DOSE,) 2 MG/1.5ML SOPN Inject 1 mg into the skin once a week. 4 pen 6  . simvastatin (ZOCOR) 10 MG tablet Take 1 tablet (10 mg total) by mouth at bedtime. 90 tablet 3   No current facility-administered medications for this visit.  ALLERGIES: Dexamethasone sodium phosphate  Family History  Problem Relation Age of Onset  . Hypertension Mother   . Sarcoidosis Brother   . Hypertension Brother   . Uterine cancer Paternal Aunt   . Brain cancer Paternal Grandmother   . Diabetes Paternal Grandmother     Social History   Socioeconomic History  . Marital status: Legally Separated    Spouse name: Not on file  . Number of children: 0  . Years of education: Not on file  . Highest education level: Not on file  Occupational History  . Occupation: Works at a [employer]  . Financial resource strain: Not on file  . Food insecurity    Worry: Not on file    Inability: Not on file  . Transportation needs    Medical: Not on file    Non-medical: Not  on file  Tobacco Use  . Smoking status: Former Smoker    Packs/day: 0.25    Years: 11.00    Pack years: 2.75    Types: Cigarettes, E-cigarettes    Quit date: 04/07/2012    Years since quitting: 6.6  . Smokeless tobacco: Never Used  Substance and Sexual Activity  . Alcohol use: Not Currently  . Drug use: No  . Sexual activity: Yes    Partners: Male    Birth control/protection: Condom  Lifestyle  . Physical activity    Days per week: Not on file    Minutes per session: Not on file  . Stress: Not on file  Relationships  . Social Herbalist on phone: Not on file    Gets together: Not on file    Attends religious service: Not on file    Active member of club or organization: Not on file    Attends meetings of clubs or organizations: Not on file    Relationship status: Not on file  . Intimate partner violence    Fear of current or ex partner: Not on file    Emotionally abused: Not on file    Physically abused: Not on file    Forced sexual activity: Not on file  Other Topics Concern  . Not on file  Social History Narrative  . Not on file    ROS  PHYSICAL EXAMINATION:    There were no vitals taken for this visit.    General appearance: alert, cooperative and appears stated age Neck: no adenopathy, supple, symmetrical, trachea midline and thyroid {CHL AMB PHY EX THYROID NORM DEFAULT:[phone]::"normal to inspection and palpation"} Breasts: {Exam; breast:13139::"normal appearance, no masses or tenderness"} Abdomen: soft, non-tender; non distended, no masses,  no organomegaly  Pelvic: External genitalia:  no lesions              Urethra:  normal appearing urethra with no masses, tenderness or lesions              Bartholins and Skenes: normal                 Vagina: normal appearing vagina with normal color and discharge, no lesions              Cervix: {CHL AMB PHY EX CERVIX NORM [phone]::"no lesions"}              Bimanual Exam:  Uterus:  {CHL AMB  PHY EX UTERUS NORM DEFAULT:[phone]::"normal size, contour, position, consistency, mobility, non-tender"}              Adnexa: {CHL AMB  PHY EX ADNEXA NO MASS DEFAULT:[phone]::"no mass, fullness, tenderness"}              Rectovaginal: {yes no:[id number]}.  Confirms.              Anus:  normal sphincter tone, no lesions  Chaperone was present for exam.  ASSESSMENT     PLAN    An After Visit Summary was printed and given to the patient.  *** minutes face to face time of which over 50% was spent in counseling.

## 2018-12-18 ENCOUNTER — Encounter: Payer: Self-pay | Admitting: Obstetrics and Gynecology

## 2018-12-18 ENCOUNTER — Other Ambulatory Visit: Payer: Self-pay

## 2018-12-18 ENCOUNTER — Ambulatory Visit: Payer: Managed Care, Other (non HMO) | Admitting: Obstetrics and Gynecology

## 2018-12-18 VITALS — BP 136/90 | HR 88 | Temp 97.3°F | Wt 223.2 lb

## 2018-12-18 DIAGNOSIS — Z8619 Personal history of other infectious and parasitic diseases: Secondary | ICD-10-CM | POA: Diagnosis not present

## 2018-12-18 DIAGNOSIS — Z113 Encounter for screening for infections with a predominantly sexual mode of transmission: Secondary | ICD-10-CM

## 2018-12-18 NOTE — Progress Notes (Signed)
GYNECOLOGY  VISIT   HPI: 38 y.o.   Legally Separated Black or African American Not Hispanic or Latino  female   [id number] with Patient's last menstrual period was 12/12/2018 (exact date).   here for 3 month recheck for chlamydia.      GYNECOLOGIC HISTORY: Patient's last menstrual period was 12/12/2018 (exact date). Contraception: Condoms sometimes Menopausal hormone therapy: None        OB History    Gravida  1   Para      Term      Preterm      AB  1   Living        SAB  1   TAB      Ectopic      Multiple      Live Births                 Patient Active Problem List   Diagnosis Date Noted  . Type 2 diabetes mellitus with microalbuminuria, without long-term current use of insulin (HCC) 09/07/2018  . Hypertriglyceridemia 09/07/2018  . Morbid obesity (HCC) 09/07/2018  . Morbid obesity with BMI of 40.0-44.9, adult (HCC) 09/04/2018  . Mixed diabetic hyperlipidemia associated with type 2 diabetes mellitus (HCC), on Zocor 07/28/2017  . Obesity, diabetes, and hypertension syndrome (HCC) 07/28/2017  . Elevated LFTs 07/28/2017  . Macroalbuminuric diabetic nephropathy (HCC), on ARB 10/01/2014  . Secondary amenorrhea 09/30/2014  . PCOS (polycystic ovarian syndrome) 09/30/2014  . Diabetes type 2, uncontrolled (HCC), on Ozempic 07/04/2013  . Hypertension associated with diabetes (HCC) 07/04/2013  . Sarcoidosis 10/06/2012    Past Medical History:  Diagnosis Date  . Amenorrhea   . Anxiety   . Chronic sinusitis   . Complication of anesthesia    anesthesia not strong enough, fought during surgery  . Depression 2011   treated at the time, no longer on meds  . Diabetes mellitus without complication (HCC)    diet controlled  . Headache(784.0)    migraines (rare now)  . Hypertension   . Infertility, female   . Obesity   . PCOS (polycystic ovarian syndrome)   . Shortness of breath    going upstairs, being worked up for sarcoidoisi    Past Surgical History:   Procedure Laterality Date  . NASAL SINUS SURGERY N/A 09/15/2012   Procedure: BILATERAL ENDOSCOPIC MAXILLARY ANTROSTOMY/ETHMOIDECTOMY/FRONTAL SINUS SURGERY and sphenoidotomy;  Surgeon: Serena ColonelJefry Rosen, MD;  [facility] OR;  Service: ENT;  Laterality: N/A;  . VIDEO BRONCHOSCOPY Bilateral 05/15/2012   pt styated not completed    Current Outpatient Medications  Medication Sig Dispense Refill  . citalopram (CELEXA) 20 MG tablet Take 1 tablet (20 mg total) by mouth daily. 90 tablet 3  . dapagliflozin propanediol (FARXIGA) 10 MG TABS tablet Take 10 mg by mouth daily. 30 tablet 6  . losartan (COZAAR) 50 MG tablet Take 1 tablet (50 mg total) by mouth daily. 90 tablet 3  . Semaglutide, 1 MG/DOSE, (OZEMPIC, 1 MG/DOSE,) 2 MG/1.5ML SOPN Inject 1 mg into the skin once a week. 4 pen 6  . simvastatin (ZOCOR) 10 MG tablet Take 1 tablet (10 mg total) by mouth at bedtime. 90 tablet 3  . medroxyPROGESTERone (PROVERA) 5 MG tablet Take one tablet a day for 5 days every other month if no spontaneous menses. (Patient not taking: Reported on 12/18/2018) 15 tablet 1   No current facility-administered medications for this visit.      ALLERGIES: Dexamethasone sodium phosphate  Family History  Problem Relation  Age of Onset  . Hypertension Mother   . Sarcoidosis Brother   . Hypertension Brother   . Uterine cancer Paternal Aunt   . Brain cancer Paternal Grandmother   . Diabetes Paternal Grandmother     Social History   Socioeconomic History  . Marital status: Legally Separated    Spouse name: Not on file  . Number of children: 0  . Years of education: Not on file  . Highest education level: Not on file  Occupational History  . Occupation: Works at a [employer]  Social Needs  . Financial resource strain: Not on file  . Food insecurity    Worry: Not on file    Inability: Not on file  . Transportation needs    Medical: Not on file    Non-medical: Not on file  Tobacco Use  . Smoking status: Former Smoker     Packs/day: 0.25    Years: 11.00    Pack years: 2.75    Types: Cigarettes, E-cigarettes    Quit date: 04/07/2012    Years since quitting: 6.7  . Smokeless tobacco: Never Used  Substance and Sexual Activity  . Alcohol use: Not Currently  . Drug use: No  . Sexual activity: Yes    Partners: Male    Birth control/protection: Condom  Lifestyle  . Physical activity    Days per week: Not on file    Minutes per session: Not on file  . Stress: Not on file  Relationships  . Social Musician on phone: Not on file    Gets together: Not on file    Attends religious service: Not on file    Active member of club or organization: Not on file    Attends meetings of clubs or organizations: Not on file    Relationship status: Not on file  . Intimate partner violence    Fear of current or ex partner: Not on file    Emotionally abused: Not on file    Physically abused: Not on file    Forced sexual activity: Not on file  Other Topics Concern  . Not on file  Social History Narrative  . Not on file    Review of Systems  Constitutional: Negative.   HENT: Negative.   Eyes: Negative.   Respiratory: Negative.   Cardiovascular: Negative.   Gastrointestinal: Negative.   Genitourinary: Negative.   Musculoskeletal: Negative.   Skin: Negative.   Neurological: Negative.   Endo/Heme/Allergies: Negative.   Psychiatric/Behavioral: Negative.     PHYSICAL EXAMINATION:    BP 136/90 (BP Location: Right Arm, Patient Position: Sitting, Cuff Size: Large)   Pulse 88   Temp (!) 97.3 F (36.3 C) (Skin)   Wt 223 lb 3.2 oz (101.2 kg)   LMP 12/12/2018 (Exact Date)   BMI 39.54 kg/m     General appearance: alert, cooperative and appears stated age  Pelvic: External genitalia:  no lesions              Urethra:  normal appearing urethra with no masses, tenderness or lesions              Bartholins and Skenes: normal                 Vagina: normal appearing vagina with normal color and  discharge, no lesions              Cervix: no lesions  Chaperone was present for exam.  ASSESSMENT H/O chlamydia, s/p treatment   PLAN Retesting done.  Discussed Chlamydia, information given   An After Visit Summary was printed and given to the patient.

## 2018-12-18 NOTE — Patient Instructions (Signed)
Chlamydia, Female Chlamydia is an STD (sexually transmitted disease). It is a bacterial infection that spreads (is contagious) through sexual contact. Chlamydia can occur in different areas of the body, including:  The tube that moves urine from the bladder out of the body (urethra).  The lower part of the uterus (cervix).  The throat.  The rectum. This condition is not difficult to treat. However, if left untreated, chlamydia can lead to more serious health problems, including pelvic inflammatory disorder (PID). PID can increase your risk of not being able to have children (sterility). Also, if chlamydia is left untreated and you are pregnant or become pregnant, there is a chance that your baby can become infected during delivery. This may cause serious health problems for the baby. What are the causes? Chlamydia is caused by the bacteria Chlamydia trachomatis. It is passed from an infected partner during sexual activity. Chlamydia can spread through contact with the genitals, mouth, or rectum. What are the signs or symptoms? In some cases, there may not be any symptoms for this condition (asymptomatic), especially early in the infection. If symptoms develop, they may include:  Burning with urination.  Frequent urination.  Vaginal discharge.  Redness, soreness, and swelling (inflammation) of the rectum.  Bleeding or discharge from the rectum.  Abdominal pain.  Pain during sexual intercourse.  Bleeding between menstrual periods.  Itching, burning, or redness in the eyes, or discharge from the eyes. How is this diagnosed? This condition may be diagnosed with:  Urine tests.  Swab tests. Depending on your symptoms, your health care provider may use a cotton swab to collect discharge from your vagina or rectum to test for the bacteria.  A pelvic exam. How is this treated? This condition is treated with antibiotic medicines. If you are pregnant, certain types of antibiotics will  need to be avoided. Follow these instructions at home: Medicines  Take over-the-counter and prescription medicines only as told by your health care provider.  Take your antibiotic medicine as told by your health care provider. Do not stop taking the antibiotic even if you start to feel better. Sexual activity  Tell sexual partners about your infection. This includes any oral, anal, or vaginal sex partners you have had within 60 days of when your symptoms started. Sexual partners should also be treated, even if they have no signs of the disease.  Do not have sex until you and your sexual partners have completed treatment and your health care provider says it is okay. If your health care provider prescribed you a single dose treatment, wait 7 days after taking the treatment before having sex. General instructions  It is your responsibility to get your test results. Ask your health care provider, or the department performing the test, when your results will be ready.  Get plenty of rest.  Eat a healthy, well-balanced diet.  Drink enough fluids to keep your urine clear or pale yellow.  Keep all follow-up visits as told by your health care provider. This is important. You may need to be tested for infection again 3 months after treatment. How is this prevented? The only sure way to prevent chlamydia is to avoid having sex. However, you can lower your risk by:  Using latex condoms correctly every time you have sex.  Not having multiple sexual partners.  Asking if your sexual partner has been tested for STIs and had negative results. Contact a health care provider if:  You develop new symptoms or your symptoms do not get   better after completing treatment.  You have a fever or chills.  You have pain during sexual intercourse. Get help right away if:  Your pain gets worse and does not get better with medicine.  You develop flu-like symptoms, such as night sweats, sore throat, or  muscle aches.  You experience nausea or vomiting.  You have difficulty swallowing.  You have bleeding between periods or after sex.  You have irregular menstrual periods.  You have abdominal or lower back pain that does not get better with medicine.  You feel weak or dizzy, or you faint.  You are pregnant and you develop symptoms of chlamydia. Summary  Chlamydia is an STD (sexually transmitted disease). It is a bacterial infection that spreads (is contagious) through sexual contact.  This condition is not difficult to treat, however. If left untreated, chlamydia can lead to more serious health problems, including pelvic inflammatory disease (PID).  In some cases, there may not be any symptoms for this condition (asymptomatic).  This condition is treated with antibiotic medicines.  Using latex condoms correctly every time you have sex can help prevent chlamydia. This information is not intended to replace advice given to you by your health care provider. Make sure you discuss any questions you have with your health care provider. Document Released: 11/04/2004 Document Revised: 07/19/2017 Document Reviewed: 01/12/2016 Elsevier Patient Education  2020 Elsevier Inc.  

## 2018-12-20 LAB — CHLAMYDIA/GONOCOCCUS/TRICHOMONAS, NAA
Chlamydia by NAA: NEGATIVE
Gonococcus by NAA: NEGATIVE
Trich vag by NAA: NEGATIVE

## 2019-03-02 ENCOUNTER — Other Ambulatory Visit: Payer: Self-pay

## 2019-03-06 ENCOUNTER — Ambulatory Visit: Payer: Managed Care, Other (non HMO) | Admitting: Internal Medicine

## 2019-03-06 NOTE — Progress Notes (Deleted)
Name: Tanya Hardy  Age/ Sex: 39 y.o., female   MRN/ DOB: [id number], Dec 07, 1980     PCP: Helane Rima, DO   Reason for Endocrinology Evaluation: Type {NUMBERS 1 OR 2:522190} Diabetes Mellitus  Initial Endocrine Consultative Visit: ***    PATIENT IDENTIFIER: Tanya Hardy is a 39 y.o. female with a past medical history of ***. The patient has followed with Endocrinology clinic since *** for consultative assistance with management of her diabetes.  DIABETIC HISTORY:  Tanya Hardy was diagnosed with DM*** ***, and started insulin therapy approximately *** years after diagnosis. ***. Her hemoglobin A1c has ranged from *** in ***, peaking at *** in ***.   SUBJECTIVE:   During the last visit (***): ***  Today (03/06/2019): Tanya Hardy  She checks her blood sugars *** times daily, preprandial to breakfast and ***. The patient has *** had hypoglycemic episodes since the last clinic visit, which typically occur *** x / - most often occuring ***. The patient is *** symptomatic with these episodes, with symptoms of {symptoms; hypoglycemia:9084048}. Otherwise, the patient {HAS/HAS NOT:522402} required any recent emergency interventions for hypoglycemia and {HAS/HAS NOT:522402} had recent hospitalizations secondary to hyper or hypoglycemic episodes.    ROS: As per HPI and as detailed below: ROS    HOME DIABETES REGIMEN:    Statin: *** ACE-I/ARB: *** Prior Diabetic Education: ***   METER DOWNLOAD SUMMARY: Date range evaluated: *** Fingerstick Blood Glucose Tests = *** Average Number Tests/Day = *** Overall Mean FS Glucose = *** Standard Deviation = ***  BG Ranges: Low = *** High = ***   Hypoglycemic Events/30 Days: BG < 50 = *** Episodes of symptomatic severe hypoglycemia = ***    DIABETIC COMPLICATIONS: Microvascular complications:   ***  Denies:   Last Eye Exam: Completed   Macrovascular complications:   ***  Denies: CAD, CVA, PVD   HISTORY:   Past Medical History:  Past Medical History:  Diagnosis Date  . Amenorrhea   . Anxiety   . Chronic sinusitis   . Complication of anesthesia    anesthesia not strong enough, fought during surgery  . Depression 2011   treated at the time, no longer on meds  . Diabetes mellitus without complication (HCC)    diet controlled  . Headache(784.0)    migraines (rare now)  . Hypertension   . Infertility, female   . Obesity   . PCOS (polycystic ovarian syndrome)   . Shortness of breath    going upstairs, being worked up for sarcoidoisi    Past Surgical History:  Past Surgical History:  Procedure Laterality Date  . NASAL SINUS SURGERY N/A 09/15/2012   Procedure: BILATERAL ENDOSCOPIC MAXILLARY ANTROSTOMY/ETHMOIDECTOMY/FRONTAL SINUS SURGERY and sphenoidotomy;  Surgeon: Serena Colonel, MD;  Location: [facility] OR;  Service: ENT;  Laterality: N/A;  . VIDEO BRONCHOSCOPY Bilateral 05/15/2012   pt styated not completed     Social History:  reports that she quit smoking about 6 years ago. Her smoking use included cigarettes and e-cigarettes. She has a 2.75 pack-year smoking history. She has never used smokeless tobacco. She reports previous alcohol use. She reports that she does not use drugs. Family History:  Family History  Problem Relation Age of Onset  . Hypertension Mother   . Sarcoidosis Brother   . Hypertension Brother   . Uterine cancer Paternal Aunt   . Brain cancer Paternal Grandmother   . Diabetes Paternal Grandmother       HOME MEDICATIONS: Allergies as of 03/06/2019  Reactions   Dexamethasone Sodium Phosphate Other (See Comments)   Pt states after receiving injection had severe nosebleeds, bruising, blood blister on lip      Medication List       Accurate as of March 06, 2019 10:18 AM. If you have any questions, ask your nurse or doctor.        citalopram 20 MG tablet Commonly known as: CELEXA Take 1 tablet (20 mg total) by mouth daily.   Farxiga 10 MG Tabs tablet  Generic drug: dapagliflozin propanediol Take 10 mg by mouth daily.   losartan 50 MG tablet Commonly known as: COZAAR Take 1 tablet (50 mg total) by mouth daily.   medroxyPROGESTERone 5 MG tablet Commonly known as: Provera Take one tablet a day for 5 days every other month if no spontaneous menses.   Semaglutide (1 MG/DOSE) 2 MG/1.5ML Sopn Commonly known as: Ozempic (1 MG/DOSE) Inject 1 mg into the skin once a week.   simvastatin 10 MG tablet Commonly known as: ZOCOR Take 1 tablet (10 mg total) by mouth at bedtime.        OBJECTIVE:   Vital Signs: There were no vitals taken for this visit.  Wt Readings from Last 3 Encounters:  12/18/18 223 lb 3.2 oz (101.2 kg)  11/07/18 227 lb (103 kg)  09/06/18 229 lb 3.2 oz (104 kg)     Exam: General: Pt appears well and is in NAD  Hydration: Well-hydrated with moist mucous membranes and good skin turgor  HEENT: Head: Unremarkable with good dentition. Oropharynx clear without exudate.  Eyes: External eye exam normal without stare, lid lag or exophthalmos.  EOM intact.  PERRL.  Neck: General: Supple without adenopathy. Thyroid: Thyroid size normal.  No goiter or nodules appreciated. No thyroid bruit.  Lungs: Clear with good BS bilat with no rales, rhonchi, or wheezes  Heart: RRR with normal S1 and S2 and no gallops; no murmurs; no rub  Abdomen: Normoactive bowel sounds, soft, nontender, without masses or organomegaly palpable  Extremities: No pretibial edema. No tremor. Normal strength and motion throughout. See detailed diabetic foot exam below.  Skin: Normal texture and temperature to palpation. No rash noted. No Acanthosis nigricans/skin tags. No lipohypertrophy.  Neuro: MS is good with appropriate affect, pt is alert and Ox3    DM foot exam: Please see diabetic assessment flow-sheet detailed below:           DATA REVIEWED:  Lab Results  Component Value Date   HGBA1C 10.4 (H) 07/10/2018   HGBA1C 7.5 (A) 12/30/2017    HGBA1C 7.9 (A) 09/28/2017   Lab Results  Component Value Date   MICROALBUR 86.4 (H) 12/30/2017   LDLCALC Comment 06/22/2017   CREATININE 1.08 09/28/2017   Lab Results  Component Value Date   MICRALBCREAT 87.5 (H) 12/30/2017     Lab Results  Component Value Date   CHOL 145 09/28/2017   HDL 29.70 (L) 09/28/2017   LDLCALC Comment 06/22/2017   LDLDIRECT 79.0 09/28/2017   TRIG 287.0 (H) 09/28/2017   CHOLHDL 5 09/28/2017         ASSESSMENT / PLAN / RECOMMENDATIONS:   1) Type {NUMBERS 1 OR 2:522190} Diabetes Mellitus, ***controlled, With *** complications - Most recent A1c of *** %. Goal A1c < *** %.  ***  Plan: MEDICATIONS:  ***  EDUCATION / INSTRUCTIONS:  BG monitoring instructions: Patient is instructed to check her blood sugars *** times a day, ***.  Call [place] [facility] clinic if: BG persistently < 70  or > 300. . I reviewed the Rule of 15 for the treatment of hypoglycemia in detail with the patient. Literature supplied.  REFERRALS:  ***.   2) Diabetic complications:   Eye: Does *** have known diabetic retinopathy.   Neuro/ Feet: Does *** have known diabetic peripheral neuropathy .   Renal: Patient does *** have known baseline CKD. She   is *** on an ACEI/ARB at present. Check urine albumin/creatinine ratio yearly starting at time of diagnosis. If albuminuria is positive, treatment is geared toward better glucose, blood pressure control and use of ACE inhibitors or ARBs. Monitor electrolytes and creatinine once to twice yearly.   3) Lipids: Patient is *** on a statin.  4) Hypertension: *** at goal of < 140/90 mmHg.    F/U in ***    Signed electronically by: Mack Guise, MD  [facility] Endocrinology  [facility] [address] [place], [place] [address] Phone: [phone] FAX: [phone]   CC: [address] [address] Phone: [phone]  Fax:  [phone]  Return to Endocrinology clinic as below: Future Appointments  Date Time Provider [place]  03/06/2019  3:40 PM Nancye Grumbine, Melanie Crazier, MD [facility] None  08/22/2019  3:30 PM Salvadore Dom, MD [facility] None

## 2019-04-21 ENCOUNTER — Other Ambulatory Visit: Payer: Self-pay | Admitting: Family Medicine

## 2019-04-21 DIAGNOSIS — E669 Obesity, unspecified: Secondary | ICD-10-CM

## 2019-04-21 DIAGNOSIS — E1165 Type 2 diabetes mellitus with hyperglycemia: Secondary | ICD-10-CM

## 2019-04-21 DIAGNOSIS — E1159 Type 2 diabetes mellitus with other circulatory complications: Secondary | ICD-10-CM

## 2019-04-23 NOTE — Telephone Encounter (Signed)
Please review

## 2019-04-27 ENCOUNTER — Telehealth: Payer: Self-pay | Admitting: Physician Assistant

## 2019-04-27 DIAGNOSIS — E1159 Type 2 diabetes mellitus with other circulatory complications: Secondary | ICD-10-CM

## 2019-04-27 DIAGNOSIS — E669 Obesity, unspecified: Secondary | ICD-10-CM

## 2019-04-27 DIAGNOSIS — E1165 Type 2 diabetes mellitus with hyperglycemia: Secondary | ICD-10-CM

## 2019-04-27 NOTE — Telephone Encounter (Signed)
Record updated

## 2019-04-27 NOTE — Telephone Encounter (Signed)
Patient wanted her records updated to reflect her flu shot which she received Oct 8th at [facility] on [address].  She also received the COVID Vac (Moderna) 1st dose 2/13 and 2nd dose 3/14.

## 2019-04-27 NOTE — Telephone Encounter (Signed)
..   LAST APPOINTMENT DATE: 04/21/2019   NEXT APPOINTMENT DATE:@4 /19/2021  MEDICATION: Ozempic  PHARMACY: [facility] on [address]   **Let patient know to contact pharmacy at the end of the day to make sure medication is ready. **  ** Please notify patient to allow 48-72 hours to process**  **Encourage patient to contact the pharmacy for refills or they can request refills through [facility]**  CLINICAL FILLS OUT ALL BELOW:   LAST REFILL:  QTY:  REFILL DATE:    OTHER COMMENTS: Patient has a TOC appt scheduled with Sam on 4/19.  Is requesting refill on this med to get her through until that appt.    Okay for refill?  Please advise

## 2019-04-30 MED ORDER — OZEMPIC (1 MG/DOSE) 2 MG/1.5ML ~~LOC~~ SOPN: 1.0000 mg | PEN_INJECTOR | SUBCUTANEOUS | 0 refills | Status: DC

## 2019-04-30 NOTE — Telephone Encounter (Signed)
Spoke to pt told her Rx was sent to pharmacy,. Pt verbalized understanding.

## 2019-05-21 ENCOUNTER — Other Ambulatory Visit: Payer: Self-pay

## 2019-05-21 ENCOUNTER — Encounter: Payer: Self-pay | Admitting: Physician Assistant

## 2019-05-21 ENCOUNTER — Other Ambulatory Visit: Payer: Self-pay | Admitting: Physician Assistant

## 2019-05-21 ENCOUNTER — Ambulatory Visit (INDEPENDENT_AMBULATORY_CARE_PROVIDER_SITE_OTHER): Payer: Managed Care, Other (non HMO) | Admitting: Physician Assistant

## 2019-05-21 VITALS — BP 140/90 | HR 96 | Temp 97.0°F | Ht 63.0 in | Wt 228.5 lb

## 2019-05-21 DIAGNOSIS — E1169 Type 2 diabetes mellitus with other specified complication: Secondary | ICD-10-CM | POA: Diagnosis not present

## 2019-05-21 DIAGNOSIS — E782 Mixed hyperlipidemia: Secondary | ICD-10-CM

## 2019-05-21 DIAGNOSIS — E1159 Type 2 diabetes mellitus with other circulatory complications: Secondary | ICD-10-CM | POA: Diagnosis not present

## 2019-05-21 DIAGNOSIS — I10 Essential (primary) hypertension: Secondary | ICD-10-CM

## 2019-05-21 DIAGNOSIS — E1129 Type 2 diabetes mellitus with other diabetic kidney complication: Secondary | ICD-10-CM | POA: Diagnosis not present

## 2019-05-21 DIAGNOSIS — K5900 Constipation, unspecified: Secondary | ICD-10-CM

## 2019-05-21 DIAGNOSIS — R1030 Lower abdominal pain, unspecified: Secondary | ICD-10-CM

## 2019-05-21 DIAGNOSIS — I152 Hypertension secondary to endocrine disorders: Secondary | ICD-10-CM

## 2019-05-21 DIAGNOSIS — R809 Proteinuria, unspecified: Secondary | ICD-10-CM

## 2019-05-21 LAB — COMPREHENSIVE METABOLIC PANEL
ALT: 26 U/L (ref 0–35)
AST: 23 U/L (ref 0–37)
Albumin: 4.4 g/dL (ref 3.5–5.2)
Alkaline Phosphatase: 97 U/L (ref 39–117)
BUN: 21 mg/dL (ref 6–23)
CO2: 26 mEq/L (ref 19–32)
Calcium: 10.1 mg/dL (ref 8.4–10.5)
Chloride: 99 mEq/L (ref 96–112)
Creatinine, Ser: 1.15 mg/dL (ref 0.40–1.20)
GFR: 64.47 mL/min (ref >60.00)
Glucose, Bld: 239 mg/dL — ABNORMAL HIGH (ref 70–99)
Potassium: 4.8 mEq/L (ref 3.5–5.1)
Sodium: 136 mEq/L (ref 135–145)
Total Bilirubin: 0.3 mg/dL (ref 0.2–1.2)
Total Protein: 7.7 g/dL (ref 6.0–8.3)

## 2019-05-21 LAB — LIPID PANEL
Cholesterol: 220 mg/dL — ABNORMAL HIGH (ref 0–200)
HDL: 37 mg/dL — ABNORMAL LOW (ref >39.00)
Total CHOL/HDL Ratio: 6
Triglycerides: 775 mg/dL — ABNORMAL HIGH (ref 0.0–149.0)

## 2019-05-21 LAB — POCT GLYCOSYLATED HEMOGLOBIN (HGB A1C): Hemoglobin A1C: 10.6 % — AB (ref 4.0–5.6)

## 2019-05-21 LAB — LDL CHOLESTEROL, DIRECT: Direct LDL: 71 mg/dL

## 2019-05-21 MED ORDER — ATORVASTATIN CALCIUM 40 MG PO TABS: 40.0000 mg | ORAL_TABLET | Freq: Every day | ORAL | 1 refills | Status: AC

## 2019-05-21 NOTE — Progress Notes (Signed)
Tanya Hardy is a 39 y.o. female is here for transfer of care.  I acted as a Neurosurgeon for [name], PA-C [name], LPN  History of Present Illness:   Chief Complaint  Patient presents with  . Diabetes  . Hypertension  . GI Problem    HPI   Pt is here today for transfer of care from [name].  Diabetes Was diagnosed in 2010. Was on insulin from 2010-2019. She is considering bariatric surgery. She states sugars have been running high for the past 2 weeks. Pt is not checking sugars regularly. Currently taking Farxiga 10 mg daily and Ozempic 1 mg once a week. She reports compliance with this medication. Denies hypoglycemia. Has been having some blurred vision, sugar was 300 this morning. Denies neuropathy. Feels like her body is getting used to the Ozempic and is not working like it normally does.    Metformin caused significant stomach pain.   Her last A1c 8 months ago was 10.4%. She states that she missed her last follow-up with her endocrinologist.   Hypertension Pt following up, does not check blood pressure at home. Currently taking Losartan 50 mg daily. Pt denies headaches, dizziness, chest pain, SOB or lower leg edema. Denies excessive caffeine intake, stimulant usage, excessive alcohol intake or increase in salt consumption.  BP Readings from Last 3 Encounters:  05/21/19 140/90  12/18/18 136/90  09/06/18 (!) 164/116    GI issues Pt c/o a lot of gas, belching and bloating for the past 2 months. Also constipation with it. Pt has moved her bowels only twice in the last week. Pt does take gas-X and ex-lax occasionally.  Over the last year, her symptoms have gotten worse with time. Has pain at 8.5/10 at times with her constipation and bloating.  It will sometimes wake her up in the middle the night and she will have to run to the bathroom and had diarrhea.  Significant triggers for her include the following: dairy, bell pepper, celery, beans. Denies rectal  bleeding. Has had fissures.  She states that her gas and pain is always on the right side of her abdomen.  She states that she thinks that one person in her family had colon cancer but she is unable to tell me who was or age of onset.  She states that her grandfather had diverticulitis.  HLD She is currently prescribed Zocor.  She does admit that she does not take this regularly.   Health Maintenance Due  Topic Date Due  . OPHTHALMOLOGY EXAM  05/28/2016  . FOOT EXAM  06/28/2018  . PAP SMEAR-Modifier  06/18/2019    Past Medical History:  Diagnosis Date  . Amenorrhea   . Anxiety   . Chronic sinusitis   . Complication of anesthesia    anesthesia not strong enough, fought during surgery  . Depression 2011   treated at the time, no longer on meds  . Diabetes mellitus without complication (HCC)    diet controlled  . Headache(784.0)    migraines (rare now)  . Hypertension   . Infertility, female   . Obesity   . PCOS (polycystic ovarian syndrome)   . Sarcoidosis 2015   SOB  . Shortness of breath    going upstairs, being worked up for sarcoidoisi     Social History   Socioeconomic History  . Marital status: Legally Separated    Spouse name: Not on file  . Number of children: 0  . Years of education: Not on  file  . Highest education level: Not on file  Occupational History  . Occupation: Works at a [employer]  Tobacco Use  . Smoking status: Former Smoker    Packs/day: 0.25    Years: 11.00    Pack years: 2.75    Types: Cigarettes, E-cigarettes    Quit date: 04/07/2012    Years since quitting: 7.1  . Smokeless tobacco: Never Used  Substance and Sexual Activity  . Alcohol use: Not Currently  . Drug use: No  . Sexual activity: Yes    Partners: Male    Birth control/protection: Condom  Other Topics Concern  . Not on file  Social History Narrative   Single, recently divorced   4 kids --> 19, 15, 47, 11   Works doing Financial planner Strain:   . Difficulty of Paying Living Expenses:   Food Insecurity:   . Worried About Programme researcher, broadcasting/film/video in the Last Year:   . Barista in the Last Year:   Transportation Needs:   . Freight forwarder (Medical):   Marland Kitchen Lack of Transportation (Non-Medical):   Physical Activity:   . Days of Exercise per Week:   . Minutes of Exercise per Session:   Stress:   . Feeling of Stress :   Social Connections:   . Frequency of Communication with Friends and Family:   . Frequency of Social Gatherings with Friends and Family:   . Attends Religious Services:   . Active Member of Clubs or Organizations:   . Attends Banker Meetings:   Marland Kitchen Marital Status:   Intimate Partner Violence:   . Fear of Current or Ex-Partner:   . Emotionally Abused:   Marland Kitchen Physically Abused:   . Sexually Abused:     Past Surgical History:  Procedure Laterality Date  . NASAL SINUS SURGERY N/A 09/15/2012   Procedure: BILATERAL ENDOSCOPIC MAXILLARY ANTROSTOMY/ETHMOIDECTOMY/FRONTAL SINUS SURGERY and sphenoidotomy;  Surgeon: Serena Colonel, MD;  Location: [facility] OR;  Service: ENT;  Laterality: N/A;  . VIDEO BRONCHOSCOPY Bilateral 05/15/2012   pt styated not completed    Family History  Problem Relation Age of Onset  . Hypertension Mother   . Sarcoidosis Brother   . Hypertension Brother   . Uterine cancer Paternal Aunt   . Brain cancer Paternal Grandmother   . Diabetes Paternal Grandmother   . Diverticulitis Paternal Grandmother     PMHx, SurgHx, SocialHx, FamHx, Medications, and Allergies were reviewed in the Visit Navigator and updated as appropriate.   Patient Active Problem List   Diagnosis Date Noted  . Type 2 diabetes mellitus with microalbuminuria, without long-term current use of insulin (HCC) 09/07/2018  . Hypertriglyceridemia 09/07/2018  . Morbid obesity (HCC) 09/07/2018  . Morbid obesity with BMI of 40.0-44.9, adult (HCC) 09/04/2018  . Mixed diabetic hyperlipidemia  associated with type 2 diabetes mellitus (HCC), on Zocor 07/28/2017  . Obesity, diabetes, and hypertension syndrome (HCC) 07/28/2017  . Elevated LFTs 07/28/2017  . Macroalbuminuric diabetic nephropathy (HCC), on ARB 10/01/2014  . Secondary amenorrhea 09/30/2014  . PCOS (polycystic ovarian syndrome) 09/30/2014  . Diabetes type 2, uncontrolled (HCC), on Ozempic 07/04/2013  . Hypertension associated with diabetes (HCC) 07/04/2013  . Sarcoidosis 10/06/2012    Social History   Tobacco Use  . Smoking status: Former Smoker    Packs/day: 0.25    Years: 11.00    Pack years: 2.75    Types: Cigarettes, E-cigarettes  Quit date: 04/07/2012    Years since quitting: 7.1  . Smokeless tobacco: Never Used  Substance Use Topics  . Alcohol use: Not Currently  . Drug use: No    Current Medications and Allergies:    Current Outpatient Medications:  .  citalopram (CELEXA) 20 MG tablet, Take 1 tablet (20 mg total) by mouth daily., Disp: 90 tablet, Rfl: 3 .  dapagliflozin propanediol (FARXIGA) 10 MG TABS tablet, Take 10 mg by mouth daily., Disp: 30 tablet, Rfl: 6 .  losartan (COZAAR) 50 MG tablet, Take 1 tablet (50 mg total) by mouth daily., Disp: 90 tablet, Rfl: 3 .  medroxyPROGESTERone (PROVERA) 5 MG tablet, Take one tablet a day for 5 days every other month if no spontaneous menses., Disp: 15 tablet, Rfl: 1 .  Semaglutide, 1 MG/DOSE, (OZEMPIC, 1 MG/DOSE,) 2 MG/1.5ML SOPN, Inject 1 mg into the skin once a week., Disp: 4 pen, Rfl: 0 .  simvastatin (ZOCOR) 10 MG tablet, Take 1 tablet (10 mg total) by mouth at bedtime., Disp: 90 tablet, Rfl: 3   Allergies  Allergen Reactions  . Dexamethasone Sodium Phosphate Other (See Comments)    Pt states after receiving injection had severe nosebleeds, bruising, blood blister on lip    Review of Systems   ROS  Negative unless otherwise specified per HPI.  Vitals:   Vitals:   05/21/19 0816  BP: 140/90  Pulse: 96  Temp: (!) 97 F (36.1 C)  TempSrc:  Oral  SpO2: 98%  Weight: 228 lb 8 oz (103.6 kg)  Height: 5\' 3"  (1.6 m)     Body mass index is 40.48 kg/m.   Physical Exam:    Physical Exam Vitals and nursing note reviewed.  Constitutional:      General: She is not in acute distress.    Appearance: She is well-developed. She is not ill-appearing or toxic-appearing.  Cardiovascular:     Rate and Rhythm: Normal rate and regular rhythm.     Pulses: Normal pulses.     Heart sounds: Normal heart sounds, S1 normal and S2 normal.     Comments: No LE edema Pulmonary:     Effort: Pulmonary effort is normal.     Breath sounds: Normal breath sounds.  Abdominal:     General: Abdomen is flat. Bowel sounds are normal.     Palpations: Abdomen is soft.     Tenderness: There is no abdominal tenderness. There is no right CVA tenderness or left CVA tenderness.  Skin:    General: Skin is warm and dry.  Neurological:     Mental Status: She is alert.     GCS: GCS eye subscore is 4. GCS verbal subscore is 5. GCS motor subscore is 6.  Psychiatric:        Speech: Speech normal.        Behavior: Behavior normal. Behavior is cooperative.    Results for orders placed or performed in visit on 05/21/19  POCT glycosylated hemoglobin (Hb A1C)  Result Value Ref Range   Hemoglobin A1C 10.6 (A) 4.0 - 5.6 %      Assessment and Plan:    Deneka was seen today for diabetes, hypertension and gi problem.  Diagnoses and all orders for this visit:  Type 2 diabetes mellitus with microalbuminuria, without long-term current use of insulin (HCC) Uncontrolled.  She is currently on maximum dose of Ozempic and [place].  She cannot tolerate metformin.  I recommended that she follow-up with her endocrinologist for next steps regarding her  medications.  Patient verbalized understanding to plan and is in agreement. -     POCT glycosylated hemoglobin (Hb A1C) -     Comprehensive metabolic panel  Mixed diabetic hyperlipidemia associated with type 2 diabetes  mellitus (HCC), on Zocor We are going to update the cholesterol panel today.  Encourage compliance with this medication. -     Lipid panel  Hypertension associated with diabetes (HCC) Currently well controlled, will continue losartan 50 mg daily.  Lower abdominal pain; Constipation, unspecified constipation type Referral to GI. -     Ambulatory referral to Gastroenterology  . Reviewed expectations re: course of current medical issues. . Discussed self-management of symptoms. . Outlined signs and symptoms indicating need for more acute intervention. . Patient verbalized understanding and all questions were answered. . See orders for this visit as documented in the electronic medical record. . Patient received an After Visit Summary.  CMA or LPN served as scribe during this visit. History, Physical, and Plan performed by medical provider. The above documentation has been reviewed and is accurate and complete.  Jarold Motto, PA-C [place], [place] 05/21/2019  Follow-up: No follow-ups on file.

## 2019-05-21 NOTE — Patient Instructions (Signed)
It was great to see you!  Please schedule an appointment ASAP with Dr. Lonzo Cloud. Phone: [phone].  Someone from the GI office will call you regarding an appointment for your GI appointment. Please reach out to our office if you haven't heard anything in the next 2 weeks.  Let's follow-up in 6 months, sooner if you have concerns.  Take care,  Jarold Motto PA-C

## 2019-05-22 NOTE — Progress Notes (Signed)
Name: Tanya Hardy  Age/ Sex: 39 y.o., female   MRN/ DOB: [id number], Aug 19, 1980     PCP: Jarold Motto, PA   Reason for Endocrinology Evaluation: Type 2 Diabetes Mellitus  Initial Endocrine Consultative Visit: 09/06/18    PATIENT IDENTIFIER: Tanya Hardy is a 39 y.o. female with a past medical history of T2DM, PCOS and Dyslipidemia . The patient has followed with Endocrinology clinic since 09/06/2018 for consultative assistance with management of her diabetes.  DIABETIC HISTORY:  Tanya Hardy was diagnosed with T2DM in 2010. She has tried multiple oral glycemic agents in the past ( Metformin - GI upset, Glipizide - No reaction, Actos-no reaction).She also has been on insulin and byetta in the past. Her hemoglobin A1c has ranged from 7.5% in 2019, peaking at 10.4% in 2020.  On her initial visit to our clinic her A1c was 10.4% . She was on Janumet and Ozempic 1 mg . We stopped Januvia and she did not want to try metformin due to prior intolerance, we continued ozempic and started [place].  SUBJECTIVE:   During the last visit (11/01/2018): Declined add-on therapy, opted for lifestyle changes.    Today (05/23/2019): Tanya Hardy  She checks her blood sugars 2 times daily, preprandial to breakfast and during the day. The patient has not had hypoglycemic episodes since the last clinic visit. Otherwise, the patient has not required any recent emergency interventions for hypoglycemia and has not had recent hospitalizations secondary to hyper or hypoglycemic episodes.      ROS: As per HPI and as detailed below: Review of Systems  Gastrointestinal: Positive for abdominal pain, diarrhea and heartburn.      HOME DIABETES REGIMEN:  Ozempic 1 mg weekly  Farxiga 10 mg daily      GLUCOSE LOG :  Fasting 150-200 mg/dL  During the day 163-846 mg/dL    DIABETIC COMPLICATIONS: Microvascular complications:    Denies: CKD but has microalbuminuria , retinopathy, neuropathy    Last eye exam: Completed 04/2018  Macrovascular complications:    Denies: CAD, PVD, CVA   HISTORY:  Past Medical History:  Past Medical History:  Diagnosis Date  . Amenorrhea   . Anxiety   . Chronic sinusitis   . Complication of anesthesia    anesthesia not strong enough, fought during surgery  . Depression 2011   treated at the time, no longer on meds  . Diabetes mellitus without complication (HCC)    diet controlled  . Headache(784.0)    migraines (rare now)  . Hypertension   . Infertility, female   . Obesity   . PCOS (polycystic ovarian syndrome)   . Sarcoidosis 2015   SOB  . Shortness of breath    going upstairs, being worked up for sarcoidoisi   Past Surgical History:  Past Surgical History:  Procedure Laterality Date  . NASAL SINUS SURGERY N/A 09/15/2012   Procedure: BILATERAL ENDOSCOPIC MAXILLARY ANTROSTOMY/ETHMOIDECTOMY/FRONTAL SINUS SURGERY and sphenoidotomy;  Surgeon: Serena Colonel, MD;  Location: [facility] OR;  Service: ENT;  Laterality: N/A;  . VIDEO BRONCHOSCOPY Bilateral 05/15/2012   pt styated not completed    Social History:  reports that she quit smoking about 7 years ago. Her smoking use included cigarettes and e-cigarettes. She has a 2.75 pack-year smoking history. She has never used smokeless tobacco. She reports previous alcohol use. She reports that she does not use drugs. Family History:  Family History  Problem Relation Age of Onset  . Hypertension Mother   . Sarcoidosis Brother   .  Hypertension Brother   . Uterine cancer Paternal Aunt   . Brain cancer Paternal Grandmother   . Diabetes Paternal Grandmother   . Diverticulitis Paternal Grandmother      HOME MEDICATIONS: Allergies as of 05/23/2019      Reactions   Dexamethasone Sodium Phosphate Other (See Comments)   Pt states after receiving injection had severe nosebleeds, bruising, blood blister on lip      Medication List       Accurate as of May 23, 2019 10:13 AM. If you have any  questions, ask your nurse or doctor.        atorvastatin 40 MG tablet Commonly known as: LIPITOR Take 1 tablet (40 mg total) by mouth daily.   citalopram 20 MG tablet Commonly known as: CELEXA Take 1 tablet (20 mg total) by mouth daily.   Farxiga 10 MG Tabs tablet Generic drug: dapagliflozin propanediol Take 10 mg by mouth daily.   Insulin Pen Needle 31G X 5 MM Misc 1 Device by Does not apply route daily. Started by: Scarlette Shorts, MD   Lantus SoloStar 100 UNIT/ML Solostar Pen Generic drug: insulin glargine Inject 20 Units into the skin daily. Started by: Scarlette Shorts, MD   losartan 50 MG tablet Commonly known as: COZAAR Take 1 tablet (50 mg total) by mouth daily.   medroxyPROGESTERone 5 MG tablet Commonly known as: Provera Take one tablet a day for 5 days every other month if no spontaneous menses.   Ozempic (1 MG/DOSE) 2 MG/1.5ML Sopn Generic drug: Semaglutide (1 MG/DOSE) Inject 1 mg into the skin once a week.      OBJECTIVE:  BP 118/88 (BP Location: Left Arm, Patient Position: Sitting, Cuff Size: Large)   Pulse 93   Temp 98.2 F (36.8 C)   Ht 5\' 3"  (1.6 m)   Wt 226 lb 12.8 oz (102.9 kg)   LMP 04/26/2019   SpO2 98%   BMI 40.18 kg/m    General: Pt appears well and is in NAD  Lungs: Clear with good BS bilat with no rales, rhonchi, or wheezes  Heart: RRR with normal S1 and S2 and no gallops; no murmurs; no rub  Abdomen: Normoactive bowel sounds, soft, nontender, without masses or organomegaly palpable  Extremities:  Lower extremities - No pretibial edema.   Neuro: MS is good with appropriate affect, pt is alert and Ox3   DM Foot Exam (05/23/2019)  The skin of the feet is intact without sores or ulcerations. The pedal pulses are 2+ on right and 2+ on left. The sensation is intact to a screening 5.07, 10 gram monofilament bilaterally   DATA REVIEWED:  Lab Results  Component Value Date   HGBA1C 10.6 (A) 05/21/2019   HGBA1C 10.4 (H)  07/10/2018   HGBA1C 7.5 (A) 12/30/2017   Lab Results  Component Value Date   MICROALBUR 86.4 (H) 12/30/2017   LDLCALC Comment 06/22/2017   CREATININE 1.15 05/21/2019   Lab Results  Component Value Date   MICRALBCREAT 87.5 (H) 12/30/2017     Lab Results  Component Value Date   CHOL 220 (H) 05/21/2019   HDL 37.00 (L) 05/21/2019   LDLCALC Comment 06/22/2017   LDLDIRECT 71.0 05/21/2019   TRIG (H) 05/21/2019    775.0 Triglyceride is over 400; calculations on Lipids are invalid.   CHOLHDL 6 05/21/2019         ASSESSMENT / PLAN / RECOMMENDATIONS:   1) Type 2 Diabetes Mellitus, Poorly controlled, With microalbuminuria - Most recent A1c  of  10.6%. Goal A1c < 7.0 %.    - Pt states her lifestyle is optimal and does not believe her hyperglycemia has anything to do with that. We discussed insulin resistant and glucose toxicity as other causes of hyperglycemia.  - She is intolerant to Metformin.  - We again discussed add-on therapy , of note she had declined add-on therapy in the past but she is willing today . - We discussed actos,  Glipizide and insulin , after much discussion she opted for the insulin at this time, as she does better with injections then pills.  -  She is not a candidate for DPP-4 inhibitors as she is already on GLP-1 agonist.  - We again discussed the importance of checking glucose at home and having that data available to me  - We also reviewed microvascular complications with uncontrolled diabetes.     MEDICATIONS:  Continue Ozempic 1 mg weekly  Continue Farxiga 10 mg daily   Start Lantus at 20 units daily   EDUCATION / INSTRUCTIONS:  BG monitoring instructions: Patient is instructed to check her blood sugars 2 times a day, before meals and bedtime   Call [place] [facility] if: BG persistently < 70 or > 300. . I reviewed the Rule of 15 for the treatment of hypoglycemia in detail with the patient. Literature supplied.   2) Diabetic  complications:   Eye: Does not have known diabetic retinopathy.   Neuro/ Feet: Does not have known diabetic peripheral neuropathy.  Renal: Patient does not have known baseline CKD. She is  on an ACEI/ARB at present.  F/U in 3  months     Signed electronically by: Mack Guise, MD  [facility]  [facility] [address] [place], [place] [address] Phone: [phone] FAX: [phone]   CC: [address], [place] [address] Phone: [phone]  Fax: [phone]  Return to Endocrinology clinic as below: Future Appointments  Date Time Provider [place]  08/22/2019  3:30 PM Salvadore Dom, MD [place] None  08/27/2019  7:50 AM , Melanie Crazier, MD [id number] None

## 2019-05-23 ENCOUNTER — Encounter: Payer: Self-pay | Admitting: Internal Medicine

## 2019-05-23 ENCOUNTER — Other Ambulatory Visit: Payer: Self-pay

## 2019-05-23 ENCOUNTER — Ambulatory Visit: Payer: Managed Care, Other (non HMO) | Admitting: Internal Medicine

## 2019-05-23 ENCOUNTER — Encounter: Payer: Self-pay | Admitting: Physician Assistant

## 2019-05-23 VITALS — BP 118/88 | HR 93 | Temp 98.2°F | Ht 63.0 in | Wt 226.8 lb

## 2019-05-23 DIAGNOSIS — E1129 Type 2 diabetes mellitus with other diabetic kidney complication: Secondary | ICD-10-CM | POA: Diagnosis not present

## 2019-05-23 DIAGNOSIS — E1165 Type 2 diabetes mellitus with hyperglycemia: Secondary | ICD-10-CM | POA: Insufficient documentation

## 2019-05-23 DIAGNOSIS — R809 Proteinuria, unspecified: Secondary | ICD-10-CM | POA: Diagnosis not present

## 2019-05-23 LAB — GLUCOSE, POCT (MANUAL RESULT ENTRY): POC Glucose: 237 mg/dl — AB (ref 70–99)

## 2019-05-23 MED ORDER — INSULIN PEN NEEDLE 31G X 5 MM MISC: 1.0000 | Freq: Every day | 6 refills | Status: AC

## 2019-05-23 MED ORDER — LANTUS SOLOSTAR 100 UNIT/ML ~~LOC~~ SOPN: 20.0000 [IU] | PEN_INJECTOR | Freq: Every day | SUBCUTANEOUS | 6 refills | Status: AC

## 2019-05-23 NOTE — Patient Instructions (Addendum)
-   Continue Ozempic 1 mg weekly  - Continue Farxiga 10 mg, 1 tablet daily  - Start Lantus 20 units daily   - try and check sugar before breakfast and bedtime      HOW TO TREAT LOW BLOOD SUGARS (Blood sugar LESS THAN 70 MG/DL)  Please follow the RULE OF 15 for the treatment of hypoglycemia treatment (when your (blood sugars are less than 70 mg/dL)    STEP 1: Take 15 grams of carbohydrates when your blood sugar is low, which includes:   3-4 GLUCOSE TABS  OR  3-4 OZ OF JUICE OR REGULAR SODA OR  ONE TUBE OF GLUCOSE GEL     STEP 2: RECHECK blood sugar in 15 MINUTES STEP 3: If your blood sugar is still low at the 15 minute recheck --> then, go back to STEP 1 and treat AGAIN with another 15 grams of carbohydrates.

## 2019-05-28 ENCOUNTER — Encounter: Payer: Managed Care, Other (non HMO) | Admitting: Physician Assistant

## 2019-05-29 ENCOUNTER — Other Ambulatory Visit: Payer: Self-pay | Admitting: Physician Assistant

## 2019-05-29 DIAGNOSIS — E1159 Type 2 diabetes mellitus with other circulatory complications: Secondary | ICD-10-CM

## 2019-05-29 DIAGNOSIS — E1165 Type 2 diabetes mellitus with hyperglycemia: Secondary | ICD-10-CM

## 2019-05-29 DIAGNOSIS — E669 Obesity, unspecified: Secondary | ICD-10-CM

## 2019-05-31 ENCOUNTER — Telehealth: Payer: Self-pay | Admitting: Internal Medicine

## 2019-05-31 NOTE — Telephone Encounter (Signed)
Spoke to pt and explained what you said and she stated that she would continue current dose and check blood sugars as directed.

## 2019-05-31 NOTE — Telephone Encounter (Signed)
Patient called to let Dr. Lonzo Cloud know that her blood sugar reading is 247 (been in the 240's for the last 3 days. Patient states her insulin is not working. Patient is at work and in a hurry but requests Dr. Lonzo Cloud call her after 3 pm today at ph# [phone].

## 2019-05-31 NOTE — Telephone Encounter (Signed)
Pt was seen 05/23/2019 and current meds are ozempic and farxiga 10 mg and pt was started on lantus at last visit, please advise

## 2019-06-05 ENCOUNTER — Other Ambulatory Visit (INDEPENDENT_AMBULATORY_CARE_PROVIDER_SITE_OTHER): Payer: Managed Care, Other (non HMO)

## 2019-06-05 ENCOUNTER — Encounter: Payer: Self-pay | Admitting: Physician Assistant

## 2019-06-05 ENCOUNTER — Ambulatory Visit (INDEPENDENT_AMBULATORY_CARE_PROVIDER_SITE_OTHER): Payer: Managed Care, Other (non HMO) | Admitting: Physician Assistant

## 2019-06-05 ENCOUNTER — Other Ambulatory Visit: Payer: Self-pay

## 2019-06-05 VITALS — BP 172/118 | HR 103 | Ht 62.75 in | Wt 230.0 lb

## 2019-06-05 DIAGNOSIS — R6881 Early satiety: Secondary | ICD-10-CM

## 2019-06-05 DIAGNOSIS — R140 Abdominal distension (gaseous): Secondary | ICD-10-CM

## 2019-06-05 DIAGNOSIS — R109 Unspecified abdominal pain: Secondary | ICD-10-CM

## 2019-06-05 DIAGNOSIS — K219 Gastro-esophageal reflux disease without esophagitis: Secondary | ICD-10-CM

## 2019-06-05 DIAGNOSIS — Z862 Personal history of diseases of the blood and blood-forming organs and certain disorders involving the immune mechanism: Secondary | ICD-10-CM

## 2019-06-05 LAB — CBC WITH DIFFERENTIAL/PLATELET
Basophils Absolute: 0 10*3/uL (ref 0.0–0.1)
Basophils Relative: 0.6 % (ref 0.0–3.0)
Eosinophils Absolute: 0.1 10*3/uL (ref 0.0–0.7)
Eosinophils Relative: 1 % (ref 0.0–5.0)
HCT: 39.4 % (ref 36.0–46.0)
Hemoglobin: 13.2 g/dL (ref 12.0–15.0)
Lymphocytes Relative: 27.9 % (ref 12.0–46.0)
Lymphs Abs: 1.8 10*3/uL (ref 0.7–4.0)
MCHC: 33.4 g/dL (ref 30.0–36.0)
MCV: 91.1 fl (ref 78.0–100.0)
Monocytes Absolute: 0.6 10*3/uL (ref 0.1–1.0)
Monocytes Relative: 9 % (ref 3.0–12.0)
Neutro Abs: 4 10*3/uL (ref 1.4–7.7)
Neutrophils Relative %: 61.5 % (ref 43.0–77.0)
Platelets: 213 10*3/uL (ref 150.0–400.0)
RBC: 4.33 Mil/uL (ref 3.87–5.11)
RDW: 13.5 % (ref 11.5–15.5)
WBC: 6.5 10*3/uL (ref 4.0–10.5)

## 2019-06-05 LAB — SEDIMENTATION RATE: Sed Rate: 52 mm/hr — ABNORMAL HIGH (ref 0–20)

## 2019-06-05 MED ORDER — OMEPRAZOLE 20 MG PO CPDR: 20.0000 mg | DELAYED_RELEASE_CAPSULE | Freq: Every morning | ORAL | 3 refills | Status: AC

## 2019-06-05 NOTE — Progress Notes (Signed)
Subjective:    Patient ID: Tanya Hardy, female    DOB: 1980-07-11, 39 y.o.   MRN: [id number]  HPI Tanya Hardy is a pleasant 39 year old African-American female, new to GI today referred by Inda Coke, PA/primary care for evaluation of abdominal pain, gas bloating and belching.. Patient has not had any prior GI evaluation.  She does have history of hypertension, PCOS, sarcoidosis, adult onset diabetes mellitus now on insulin, and obesity.  She says she had not seen a doctor for some time and is not aware of any Flares of Sarcoidosis in the past Few Years.  Typically she had pulmonary symptoms with coughing and shortness of breath when her disease has been active. Patient says she has had fairly long-term symptoms of belching and bloating but over the past 3 to 4 months has had significant increase in symptoms and has been miserable over the past month and a half with daily abdominal pain in the mid abdomen and sometimes more generalized.  She is having episodes of visible abdominal distention, lots of loud belching and "extreme gas" which has been malodorous.  Typically she tends towards constipation and may go for few days between bowel movements.  She had been having very hard stools recently but now over the past 3 to 4 days has been having some loose stools.  No melena or hematochezia.  Appetite has been somewhat decreased, no weight loss.  She says she has sensation of filling up quickly and is if her food is not digesting properly and may sit for long periods of time.  She is having frequent heartburn in the evenings, no nausea or vomiting. She says soon as her feet hit the floor in the morning she is having extreme belching and flatus. She is not consuming any carbonated beverages, no regular aspirin or NSAIDs, she does use a lot of artificial sweeteners and is using a skittles product which she sweeteners her water with. No family history of GI disease that she is aware of. Recent labs in  April 2021 glucose 239 LFTs within normal limit, no CBC done.  Patient had a CT of the abdomen in 2019 for elevated LFTs and at that time was noted to have some porta hepatis adenopathy consistent with sarcoidosis and some patchy interstitial thickening in the base of the lungs consistent with pulmonary sarcoid.  Review of Systems Pertinent positive and negative review of systems were noted in the above HPI section.  All other review of systems was otherwise negative.  Outpatient Encounter Medications as of 06/05/2019  Medication Sig  . atorvastatin (LIPITOR) 40 MG tablet Take 1 tablet (40 mg total) by mouth daily.  . citalopram (CELEXA) 20 MG tablet Take 1 tablet (20 mg total) by mouth daily.  . dapagliflozin propanediol (FARXIGA) 10 MG TABS tablet Take 10 mg by mouth daily.  . insulin glargine (LANTUS SOLOSTAR) 100 UNIT/ML Solostar Pen Inject 20 Units into the skin daily.  . Insulin Pen Needle 31G X 5 MM MISC 1 Device by Does not apply route daily.  Marland Kitchen losartan (COZAAR) 50 MG tablet Take 1 tablet (50 mg total) by mouth daily.  . medroxyPROGESTERone (PROVERA) 5 MG tablet Take one tablet a day for 5 days every other month if no spontaneous menses.  Marland Kitchen OZEMPIC, 1 MG/DOSE, 2 MG/1.5ML SOPN INJECT 1 MG INTO THE SKIN ONCE A WEEK  . omeprazole (PRILOSEC) 20 MG capsule Take 1 capsule (20 mg total) by mouth in the morning.   No facility-administered encounter medications on  file as of 06/05/2019.   Allergies  Allergen Reactions  . Dexamethasone Sodium Phosphate Other (See Comments)    Pt states after receiving injection had severe nosebleeds, bruising, blood blister on lip   Patient Active Problem List   Diagnosis Date Noted  . Type 2 diabetes mellitus with hyperglycemia, without long-term current use of insulin (HCC) 05/23/2019  . Type 2 diabetes mellitus with microalbuminuria, without long-term current use of insulin (HCC) 09/07/2018  . Hypertriglyceridemia 09/07/2018  . Morbid obesity (HCC)  09/07/2018  . Morbid obesity with BMI of 40.0-44.9, adult (HCC) 09/04/2018  . Mixed diabetic hyperlipidemia associated with type 2 diabetes mellitus (HCC), on Zocor 07/28/2017  . Obesity, diabetes, and hypertension syndrome (HCC) 07/28/2017  . Elevated LFTs 07/28/2017  . Macroalbuminuric diabetic nephropathy (HCC), on ARB 10/01/2014  . Secondary amenorrhea 09/30/2014  . PCOS (polycystic ovarian syndrome) 09/30/2014  . Diabetes type 2, uncontrolled (HCC), on Ozempic 07/04/2013  . Hypertension associated with diabetes (HCC) 07/04/2013  . Sarcoidosis 10/06/2012   Social History   Socioeconomic History  . Marital status: Legally Separated    Spouse name: Not on file  . Number of children: 0  . Years of education: Not on file  . Highest education level: Not on file  Occupational History  . Occupation: Works at a [employer]  Tobacco Use  . Smoking status: Former Smoker    Packs/day: 0.25    Years: 11.00    Pack years: 2.75    Types: Cigarettes, E-cigarettes    Quit date: 04/07/2012    Years since quitting: 7.1  . Smokeless tobacco: Never Used  Substance and Sexual Activity  . Alcohol use: Not Currently  . Drug use: No  . Sexual activity: Yes    Partners: Male    Birth control/protection: Condom  Other Topics Concern  . Not on file  Social History Narrative   Single, recently divorced   4 kids --> 19, 15, 74, 11   Works doing Dentist Strain:   . Difficulty of Paying Living Expenses:   Food Insecurity:   . Worried About Programme researcher, broadcasting/film/video in the Last Year:   . Barista in the Last Year:   Transportation Needs:   . Freight forwarder (Medical):   Marland Kitchen Lack of Transportation (Non-Medical):   Physical Activity:   . Days of Exercise per Week:   . Minutes of Exercise per Session:   Stress:   . Feeling of Stress :   Social Connections:   . Frequency of Communication with Friends and Family:   . Frequency of  Social Gatherings with Friends and Family:   . Attends Religious Services:   . Active Member of Clubs or Organizations:   . Attends Banker Meetings:   Marland Kitchen Marital Status:   Intimate Partner Violence:   . Fear of Current or Ex-Partner:   . Emotionally Abused:   Marland Kitchen Physically Abused:   . Sexually Abused:     Ms. Lowdermilk family history includes Brain cancer in her paternal grandmother; Diabetes in her paternal grandmother; Diverticulitis in her paternal grandmother; Hypertension in her brother and mother; Sarcoidosis in her brother; Uterine cancer in her paternal aunt.      Objective:    Vitals:   06/05/19 1541  BP: (!) 172/118  Pulse: (!) 103    Physical Exam Well-developed well-nourished  AA female  in no acute distress.  Height, [id number], BMI 41.07  HEENT; nontraumatic normocephalic, EOMI, PER R LA, sclera anicteric. Oropharynx;not examined Neck; supple, no JVD Cardiovascular; regular rate and rhythm with S1-S2, no murmur rub or gallop Pulmonary; Clear bilaterally Abdomen; soft, obese, mildly distended, no tympany bowel sounds active, no palpable mass or hepatosplenomegaly, there is some tenderness in the hypogastrium and the right mid quadrant Rectal; not done today skin; benign exam, no jaundice rash or appreciable lesions Extremities; no clubbing cyanosis or edema skin warm and dry Neuro/Psych; alert and oriented x4, grossly nonfocal mood and affect appropriate       Assessment & Plan:   #7 39 year old African-American female who presents with progressive symptoms over the past few months of abdominal pain, intermittent abdominal distention, very frequent significant belching and flatus, early satiety and GERD.  Etiology of symptoms is not clear, consider IBS with SIBO, consider diagnostic visceral neuropathy, component of gastroparesis and SIBO.  Patient also has history of sarcoidosis, need to consider gut involvement and/or peritoneal involvement.  #2  obesity #3.  PCOS #4.  Hypertension #5.  Adult onset diabetes mellitus insulin-dependent  #6.  History of sarcoidosis-no treatment in years #7 lactose intolerance  Plan; recent CMet reviewed, unremarkable CBC, sed rate, ACE level Schedule for CT of the abdomen and pelvis with contrast Start low gas diet Complete lactose avoidance Stop artificial sweeteners We will proceed with breath testing for SIBO/breath hydrogen Start Gas-X 1-2 before each meal Start trial of omeprazole 20 mg p.o. every morning Patient will be established with Dr. Rhea Belton, I will plan to see her back in the office in about 3 weeks.   Kreg Earhart S Jasiya Markie PA-C 06/05/2019   Cc: Jarold Motto, [place]

## 2019-06-05 NOTE — Patient Instructions (Signed)
If you are age 37 or older, your body mass index should be between 23-30. Your Body mass index is 41.07 kg/m. If this is out of the aforementioned range listed, please consider follow up with your Primary Care Provider.  If you are age 70 or younger, your body mass index should be between 19-25. Your Body mass index is 41.07 kg/m. If this is out of the aformentioned range listed, please consider follow up with your Primary Care Provider.   Your provider has requested that you go to the basement level for lab work before leaving today. Press "B" on the elevator. The lab is located at the first door on the left as you exit the elevator.  Due to recent changes in healthcare laws, you may see the results of your imaging and laboratory studies on MyChart before your provider has had a chance to review them.  We understand that in some cases there may be results that are confusing or concerning to you. Not all laboratory results come back in the same time frame and the provider may be waiting for multiple results in order to interpret others.  Please give [place] 48 hours in order for your provider to thoroughly review all the results before contacting the office for clarification of your results.   You have been scheduled for a CT scan of the abdomen and pelvis at [facility] ([address]---this is in the same building as [employer]).   You are scheduled on 06/07/19 at 3:00 pm. You should arrive 15 minutes prior to your appointment time for registration. Please follow the written instructions below on the day of your exam:  WARNING: IF YOU ARE ALLERGIC TO IODINE/X-RAY DYE, PLEASE NOTIFY RADIOLOGY IMMEDIATELY AT [phone]! YOU WILL BE GIVEN A 13 HOUR PREMEDICATION PREP.  1) Do not eat anything after 11:00 am (4 hours prior to your test) 2) You have been given 2 bottles of oral contrast to drink. The solution may taste better if refrigerated, but do NOT add ice or any other liquid  to this solution. Shake well before drinking.    Drink 1 bottle of contrast @ 1:00 pm (2 hours prior to your exam)  Drink 1 bottle of contrast @ 2:00 pm (1 hour prior to your exam)  You may take any medications as prescribed with a small amount of water, if necessary. If you take any of the following medications: METFORMIN, GLUCOPHAGE, GLUCOVANCE, AVANDAMET, RIOMET, FORTAMET, [place] MET, JANUMET, GLUMETZA or METAGLIP, you MAY be asked to HOLD this medication 48 hours AFTER the exam.  The purpose of you drinking the oral contrast is to aid in the visualization of your intestinal tract. The contrast solution may cause some diarrhea. Depending on your individual set of symptoms, you may also receive an intravenous injection of x-ray contrast/dye. Plan on being at [facility] for 30 minutes or longer, depending on the type of exam you are having performed.  This test typically takes 30-45 minutes to complete.  If you have any questions regarding your exam or if you need to reschedule, you may call the CT department at [phone] between the hours of 8:00 am and 5:00 pm, Monday-Friday.  ________________________________________________________________________  START Omeprazole 20 mg 1 capsule in the morning before breakfast. This has been sent to your pharmacy.  Use Gas-X 1-2 tablets before every meal. This can be bought over the counter.   STOP artificial sweetners and avoid lactose.  START a Low gas diet.

## 2019-06-06 ENCOUNTER — Ambulatory Visit: Payer: Managed Care, Other (non HMO) | Attending: Internal Medicine

## 2019-06-06 DIAGNOSIS — Z20822 Contact with and (suspected) exposure to covid-19: Secondary | ICD-10-CM

## 2019-06-06 NOTE — Progress Notes (Signed)
Addendum: Reviewed and agree with assessment and management plan. Linzie Criss M, MD  

## 2019-06-07 ENCOUNTER — Inpatient Hospital Stay: Admission: RE | Admit: 2019-06-07 | Payer: Managed Care, Other (non HMO) | Source: Ambulatory Visit

## 2019-06-07 LAB — SARS-COV-2, NAA 2 DAY TAT

## 2019-06-07 LAB — NOVEL CORONAVIRUS, NAA: SARS-CoV-2, NAA: NOT DETECTED

## 2019-06-08 ENCOUNTER — Inpatient Hospital Stay: Admission: RE | Admit: 2019-06-08 | Payer: Managed Care, Other (non HMO) | Source: Ambulatory Visit

## 2019-06-08 LAB — ANGIOTENSIN CONVERTING ENZYME: Angiotensin-Converting Enzyme: 25 U/L (ref 9–67)

## 2019-06-13 ENCOUNTER — Other Ambulatory Visit: Payer: Self-pay

## 2019-06-14 ENCOUNTER — Telehealth: Payer: Self-pay | Admitting: Obstetrics and Gynecology

## 2019-06-14 NOTE — Telephone Encounter (Signed)
Patient cancelled pregnancy test appointment because her period came on today.

## 2019-06-15 ENCOUNTER — Ambulatory Visit: Payer: Managed Care, Other (non HMO) | Admitting: Obstetrics and Gynecology

## 2019-06-24 ENCOUNTER — Other Ambulatory Visit: Payer: Self-pay | Admitting: Family Medicine

## 2019-06-24 DIAGNOSIS — E1159 Type 2 diabetes mellitus with other circulatory complications: Secondary | ICD-10-CM

## 2019-06-24 DIAGNOSIS — E1121 Type 2 diabetes mellitus with diabetic nephropathy: Secondary | ICD-10-CM

## 2019-06-24 DIAGNOSIS — I152 Hypertension secondary to endocrine disorders: Secondary | ICD-10-CM

## 2019-06-25 NOTE — Telephone Encounter (Signed)
Please review

## 2019-07-03 ENCOUNTER — Ambulatory Visit: Payer: Managed Care, Other (non HMO) | Admitting: Physician Assistant

## 2019-07-22 ENCOUNTER — Ambulatory Visit (HOSPITAL_COMMUNITY): Payer: Self-pay

## 2019-08-09 DIAGNOSIS — A599 Trichomoniasis, unspecified: Secondary | ICD-10-CM

## 2019-08-09 HISTORY — DX: Trichomoniasis, unspecified: A59.9

## 2019-08-20 ENCOUNTER — Telehealth: Payer: Self-pay

## 2019-08-20 DIAGNOSIS — E1159 Type 2 diabetes mellitus with other circulatory complications: Secondary | ICD-10-CM

## 2019-08-20 DIAGNOSIS — E669 Obesity, unspecified: Secondary | ICD-10-CM

## 2019-08-20 DIAGNOSIS — E1165 Type 2 diabetes mellitus with hyperglycemia: Secondary | ICD-10-CM

## 2019-08-20 MED ORDER — DAPAGLIFLOZIN PROPANEDIOL 10 MG PO TABS: 10.0000 mg | ORAL_TABLET | Freq: Every day | ORAL | 6 refills | Status: AC

## 2019-08-20 MED ORDER — OZEMPIC (1 MG/DOSE) 2 MG/1.5ML ~~LOC~~ SOPN: 1.0000 mg | PEN_INJECTOR | SUBCUTANEOUS | 0 refills | Status: DC

## 2019-08-20 NOTE — Progress Notes (Signed)
39 y.o. G1P0010 Legally Separated Black or African American Not Hispanic or Latino female here for annual exam.  She skipped one cycle in the last year.  Occasionally sexually active, same partner. Sometimes use condoms. Just found out he has had another partner. She hasn't been sexually active for the last week.  Period Cycle (Days): 28 Period Duration (Days): 8 Menstrual Flow: Heavy Menstrual Control: Maxi pad Menstrual Control Change Freq (Hours): 2-3 Dysmenorrhea: (!) Moderate Dysmenorrhea Symptoms: Cramping   Last HgbA1C was 10.6% in April, insulin was added to her regimen. FS fasting levels run around 140-160's, has been as high as 260 after eating.   She has noticed slight irritation and itching of the vulva recently.   Patient's last menstrual period was 07/30/2019.          Sexually active: Yes.    The current method of family planning is condoms everything .    Exercising: Yes.    Walking  Smoker:  no  Health Maintenance: Pap:  06-17-16 WNl NEG HR HPV 05-10-13 WNL History of abnormal Pap:no MMG:  None  BMD:   None  Colonoscopy: none  TDaP:  06/17/16 Gardasil: no   reports that she quit smoking about 7 years ago. Her smoking use included cigarettes and e-cigarettes. She has a 2.75 pack-year smoking history. She has never used smokeless tobacco. She reports previous alcohol use. She reports that she does not use drugs. Rare ETOH. Working with Dietitian, also working at [employer] 4 teenage children (adopted) and one in her 20's with a baby.  Past Medical History:  Diagnosis Date  . Amenorrhea   . Anxiety   . Chronic sinusitis   . Complication of anesthesia    anesthesia not strong enough, fought during surgery  . Depression 2011   treated at the time, no longer on meds  . Diabetes mellitus without complication (HCC)    diet controlled  . Headache(784.0)    migraines (rare now)  . Hypertension   . Infertility, female   . Obesity   . PCOS  (polycystic ovarian syndrome)   . Sarcoidosis 2015   SOB  . Shortness of breath    going upstairs, being worked up for sarcoidoisi    Past Surgical History:  Procedure Laterality Date  . NASAL SINUS SURGERY N/A 09/15/2012   Procedure: BILATERAL ENDOSCOPIC MAXILLARY ANTROSTOMY/ETHMOIDECTOMY/FRONTAL SINUS SURGERY and sphenoidotomy;  Surgeon: Serena Colonel, MD;  Location: [facility] OR;  Service: ENT;  Laterality: N/A;  . VIDEO BRONCHOSCOPY Bilateral 05/15/2012   pt styated not completed    Current Outpatient Medications  Medication Sig Dispense Refill  . atorvastatin (LIPITOR) 40 MG tablet Take 1 tablet (40 mg total) by mouth daily. 90 tablet 1  . citalopram (CELEXA) 20 MG tablet Take 1 tablet (20 mg total) by mouth daily. 90 tablet 3  . dapagliflozin propanediol (FARXIGA) 10 MG TABS tablet Take 1 tablet (10 mg total) by mouth daily. 30 tablet 6  . insulin glargine (LANTUS SOLOSTAR) 100 UNIT/ML Solostar Pen Inject 20 Units into the skin daily. 15 mL 6  . Insulin Pen Needle 31G X 5 MM MISC 1 Device by Does not apply route daily. 50 each 6  . losartan (COZAAR) 50 MG tablet Take 1 tablet by mouth once daily 30 tablet 5  . medroxyPROGESTERone (PROVERA) 5 MG tablet Take one tablet a day for 5 days every other month if no spontaneous menses. 15 tablet 1  . omeprazole (PRILOSEC) 20 MG capsule Take 1 capsule (  20 mg total) by mouth in the morning. 30 capsule 3  . Semaglutide, 1 MG/DOSE, (OZEMPIC, 1 MG/DOSE,) 2 MG/1.5ML SOPN Inject 0.75 mLs (1 mg total) into the skin once a week. 4 mL 0   No current facility-administered medications for this visit.    Family History  Problem Relation Age of Onset  . Hypertension Mother   . Sarcoidosis Brother   . Hypertension Brother   . Uterine cancer Paternal Aunt   . Brain cancer Paternal Grandmother   . Diabetes Paternal Grandmother   . Diverticulitis Paternal Grandmother     Review of Systems  Genitourinary:       Vaginal itching   All other systems reviewed  and are negative.   Exam:   BP (!) 150/80   Pulse 99   Ht 5\' 3"  (1.6 m)   Wt 225 lb (102.1 kg)   LMP 07/30/2019   SpO2 100%   BMI 39.86 kg/m   Weight change: @WEIGHTCHANGE @ Height:   Height: 5\' 3"  (160 cm)  Ht Readings from Last 3 Encounters:  08/22/19 5\' 3"  (1.6 m)  06/05/19 5' 2.75" (1.594 m)  05/23/19 5\' 3"  (1.6 m)    General appearance: alert, cooperative and appears stated age Head: Normocephalic, without obvious abnormality, atraumatic Neck: no adenopathy, supple, symmetrical, trachea midline and thyroid normal to inspection and palpation Lungs: clear to auscultation bilaterally Cardiovascular: regular rate and rhythm Breasts: normal appearance, no masses or tenderness Abdomen: soft, non-tender; non distended,  no masses,  no organomegaly Extremities: extremities normal, atraumatic, no cyanosis or edema Skin: Skin color, texture, turgor normal. No rashes or lesions Lymph nodes: Cervical, supraclavicular, and axillary nodes normal. No abnormal inguinal nodes palpated Neurologic: Grossly normal   Pelvic: External genitalia:  no lesions              Urethra:  normal appearing urethra with no masses, tenderness or lesions              Bartholins and Skenes: normal                 Vagina: normal appearing vagina with a slight increase in watery, white vaginal discharge              Cervix: no cervical motion tenderness and no lesions               Bimanual Exam:  Uterus:  no masses or tenderness              Adnexa: no mass, fullness, tenderness               Rectovaginal: Confirms               Anus:  normal sphincter tone, no lesions  08/24/19 chaperoned for the exam.  A:  Well Woman with normal exam  Uncontrolled DM, working on control  Irregular cycles. Only one missed cycle in the last year, her last cycle was lighter than normal.   Vaginal d/c    P:   No pap this year  STD testing  UPT (last cycle was light)  Start Gardasil  Discussed breast self  exam  Discussed calcium and vit D intake  Labs with primary  Vaginitis testing  Strongly recommended use of condoms.   Discussed option of the minipill or IUD. She declines.  Reviewed the risk of congenital abnormalities if she conceives with uncontrolled diabetes.

## 2019-08-20 NOTE — Telephone Encounter (Signed)
Medications sent to patient's pharmacy.

## 2019-08-20 NOTE — Telephone Encounter (Signed)
.. °  LAST APPOINTMENT DATE:05/21/19  NEXT APPOINTMENT DATE:@Visit  date not found  MEDICATION: Ozempic  PHARMACY: [facility] - [place], [place] - [address]  **Let patient know to contact pharmacy at the end of the day to make sure medication is ready. **  ** Please notify patient to allow 48-72 hours to process**  **Encourage patient to contact the pharmacy for refills or they can request refills through [facility]**  CLINICAL FILLS OUT ALL BELOW:   LAST REFILL:  QTY:  REFILL DATE:    OTHER COMMENTS:    Okay for refill?  Please advise   .Marland Kitchen LAST APPOINTMENT DATE: 05/21/19  NEXT APPOINTMENT DATE:@Visit  date not found  MEDICATION: [place]  PHARMACY:[facility] [place], [place] - [address]  **Let patient know to contact pharmacy at the end of the day to make sure medication is ready. **  ** Please notify patient to allow 48-72 hours to process**  **Encourage patient to contact the pharmacy for refills or they can request refills through [facility]**  CLINICAL FILLS OUT ALL BELOW:   LAST REFILL:  QTY:  REFILL DATE:    OTHER COMMENTS:  Patient asked if this could be a standing order like it was with [name] so she does not need to call up here every month to get these sent in   Okay for refill?  Please advise

## 2019-08-22 ENCOUNTER — Other Ambulatory Visit: Payer: Self-pay

## 2019-08-22 ENCOUNTER — Ambulatory Visit (INDEPENDENT_AMBULATORY_CARE_PROVIDER_SITE_OTHER): Payer: Managed Care, Other (non HMO) | Admitting: Obstetrics and Gynecology

## 2019-08-22 ENCOUNTER — Encounter: Payer: Self-pay | Admitting: Obstetrics and Gynecology

## 2019-08-22 VITALS — BP 150/80 | HR 99 | Ht 63.0 in | Wt 225.0 lb

## 2019-08-22 DIAGNOSIS — Z113 Encounter for screening for infections with a predominantly sexual mode of transmission: Secondary | ICD-10-CM | POA: Diagnosis not present

## 2019-08-22 DIAGNOSIS — Z23 Encounter for immunization: Secondary | ICD-10-CM | POA: Diagnosis not present

## 2019-08-22 DIAGNOSIS — Z7189 Other specified counseling: Secondary | ICD-10-CM | POA: Diagnosis not present

## 2019-08-22 DIAGNOSIS — Z3009 Encounter for other general counseling and advice on contraception: Secondary | ICD-10-CM

## 2019-08-22 DIAGNOSIS — F4323 Adjustment disorder with mixed anxiety and depressed mood: Secondary | ICD-10-CM | POA: Diagnosis not present

## 2019-08-22 DIAGNOSIS — Z7185 Encounter for immunization safety counseling: Secondary | ICD-10-CM

## 2019-08-22 DIAGNOSIS — N926 Irregular menstruation, unspecified: Secondary | ICD-10-CM

## 2019-08-22 DIAGNOSIS — N761 Subacute and chronic vaginitis: Secondary | ICD-10-CM

## 2019-08-22 DIAGNOSIS — Z01419 Encounter for gynecological examination (general) (routine) without abnormal findings: Secondary | ICD-10-CM

## 2019-08-22 MED ORDER — CITALOPRAM HYDROBROMIDE 20 MG PO TABS: 20.0000 mg | ORAL_TABLET | Freq: Every day | ORAL | 3 refills | Status: AC

## 2019-08-22 MED ORDER — MEDROXYPROGESTERONE ACETATE 5 MG PO TABS: ORAL_TABLET | ORAL | 1 refills | Status: AC

## 2019-08-22 NOTE — Addendum Note (Signed)
Addended by: Tobi Bastos on: 08/22/2019 04:11 PM   Modules accepted: Orders

## 2019-08-22 NOTE — Patient Instructions (Signed)
EXERCISE AND DIET:  We recommended that you start or continue a regular exercise program for good health. Regular exercise means any activity that makes your heart beat faster and makes you sweat.  We recommend exercising at least 30 minutes per day at least 3 days a week, preferably 4 or 5.  We also recommend a diet low in fat and sugar.  Inactivity, poor dietary choices and obesity can cause diabetes, heart attack, stroke, and kidney damage, among others.    ALCOHOL AND SMOKING:  Women should limit their alcohol intake to no more than 7 drinks/beers/glasses of wine (combined, not each!) per week. Moderation of alcohol intake to this level decreases your risk of breast cancer and liver damage. And of course, no recreational drugs are part of a healthy lifestyle.  And absolutely no smoking or even second hand smoke. Most people know smoking can cause heart and lung diseases, but did you know it also contributes to weakening of your bones? Aging of your skin?  Yellowing of your teeth and nails?  CALCIUM AND VITAMIN D:  Adequate intake of calcium and Vitamin D are recommended.  The recommendations for exact amounts of these supplements seem to change often, but generally speaking 1,000 mg of calcium (between diet and supplement) and 800 units of Vitamin D per day seems prudent. Certain women may benefit from higher intake of Vitamin D.  If you are among these women, your doctor will have told you during your visit.    PAP SMEARS:  Pap smears, to check for cervical cancer or precancers,  have traditionally been done yearly, although recent scientific advances have shown that most women can have pap smears less often.  However, every woman still should have a physical exam from her gynecologist every year. It will include a breast check, inspection of the vulva and vagina to check for abnormal growths or skin changes, a visual exam of the cervix, and then an exam to evaluate the size and shape of the uterus and  ovaries.  And after 37 years of age, a rectal exam is indicated to check for rectal cancers. We will also provide age appropriate advice regarding health maintenance, like when you should have certain vaccines, screening for sexually transmitted diseases, bone density testing, colonoscopy, mammograms, etc.   MAMMOGRAMS:  All women over 40 years old should have a yearly mammogram. Many facilities now offer a "3D" mammogram, which may cost around $50 extra out of pocket. If possible,  we recommend you accept the option to have the 3D mammogram performed.  It both reduces the number of women who will be called back for extra views which then turn out to be normal, and it is better than the routine mammogram at detecting truly abnormal areas.    COLON CANCER SCREENING: Now recommend starting at age 45. At this time colonoscopy is not covered for routine screening until 50. There are take home tests that can be done between 45-49.   COLONOSCOPY:  Colonoscopy to screen for colon cancer is recommended for all women at age 50.  We know, you hate the idea of the prep.  We agree, BUT, having colon cancer and not knowing it is worse!!  Colon cancer so often starts as a polyp that can be seen and removed at colonscopy, which can quite literally save your life!  And if your first colonoscopy is normal and you have no family history of colon cancer, most women don't have to have it again for   10 years.  Once every ten years, you can do something that may end up saving your life, right?  We will be happy to help you get it scheduled when you are ready.  Be sure to check your insurance coverage so you understand how much it will cost.  It may be covered as a preventative service at no cost, but you should check your particular policy.      Breast Self-Awareness Breast self-awareness means being familiar with how your breasts look and feel. It involves checking your breasts regularly and reporting any changes to your  health care provider. Practicing breast self-awareness is important. A change in your breasts can be a sign of a serious medical problem. Being familiar with how your breasts look and feel allows you to find any problems early, when treatment is more likely to be successful. All women should practice breast self-awareness, including women who have had breast implants. How to do a breast self-exam One way to learn what is normal for your breasts and whether your breasts are changing is to do a breast self-exam. To do a breast self-exam: Look for Changes  1. Remove all the clothing above your waist. 2. Stand in front of a mirror in a room with good lighting. 3. Put your hands on your hips. 4. Push your hands firmly downward. 5. Compare your breasts in the mirror. Look for differences between them (asymmetry), such as: ? Differences in shape. ? Differences in size. ? Puckers, dips, and bumps in one breast and not the other. 6. Look at each breast for changes in your skin, such as: ? Redness. ? Scaly areas. 7. Look for changes in your nipples, such as: ? Discharge. ? Bleeding. ? Dimpling. ? Redness. ? A change in position. Feel for Changes Carefully feel your breasts for lumps and changes. It is best to do this while lying on your back on the floor and again while sitting or standing in the shower or tub with soapy water on your skin. Feel each breast in the following way:  Place the arm on the side of the breast you are examining above your head.  Feel your breast with the other hand.  Start in the nipple area and make  inch (2 cm) overlapping circles to feel your breast. Use the pads of your three middle fingers to do this. Apply light pressure, then medium pressure, then firm pressure. The light pressure will allow you to feel the tissue closest to the skin. The medium pressure will allow you to feel the tissue that is a little deeper. The firm pressure will allow you to feel the tissue  close to the ribs.  Continue the overlapping circles, moving downward over the breast until you feel your ribs below your breast.  Move one finger-width toward the center of the body. Continue to use the  inch (2 cm) overlapping circles to feel your breast as you move slowly up toward your collarbone.  Continue the up and down exam using all three pressures until you reach your armpit.  Write Down What You Find  Write down what is normal for each breast and any changes that you find. Keep a written record with breast changes or normal findings for each breast. By writing this information down, you do not need to depend only on memory for size, tenderness, or location. Write down where you are in your menstrual cycle, if you are still menstruating. If you are having trouble noticing differences   in your breasts, do not get discouraged. With time you will become more familiar with the variations in your breasts and more comfortable with the exam. How often should I examine my breasts? Examine your breasts every month. If you are breastfeeding, the best time to examine your breasts is after a feeding or after using a breast pump. If you menstruate, the best time to examine your breasts is 5-7 days after your period is over. During your period, your breasts are lumpier, and it may be more difficult to notice changes. When should I see my health care provider? See your health care provider if you notice:  A change in shape or size of your breasts or nipples.  A change in the skin of your breast or nipples, such as a reddened or scaly area.  Unusual discharge from your nipples.  A lump or thick area that was not there before.  Pain in your breasts.  Anything that concerns you.  

## 2019-08-23 LAB — HEP, RPR, HIV PANEL
HIV Screen 4th Generation wRfx: NONREACTIVE
Hepatitis B Surface Ag: NEGATIVE
RPR Ser Ql: NONREACTIVE

## 2019-08-23 LAB — HEPATITIS C ANTIBODY: Hep C Virus Ab: <0.1 s/co ratio (ref 0.0–0.9)

## 2019-08-25 LAB — NUSWAB VAGINITIS PLUS (VG+)
Atopobium vaginae: HIGH Score — AB
BVAB 2: HIGH Score — AB
Candida albicans, NAA: NEGATIVE
Candida glabrata, NAA: NEGATIVE
Chlamydia trachomatis, NAA: NEGATIVE
Megasphaera 1: HIGH Score — AB
Neisseria gonorrhoeae, NAA: NEGATIVE
Trich vag by NAA: POSITIVE — AB

## 2019-08-27 ENCOUNTER — Encounter: Payer: Self-pay | Admitting: Obstetrics and Gynecology

## 2019-08-27 ENCOUNTER — Ambulatory Visit: Payer: Managed Care, Other (non HMO) | Admitting: Internal Medicine

## 2019-08-27 ENCOUNTER — Other Ambulatory Visit: Payer: Self-pay | Admitting: *Deleted

## 2019-08-27 MED ORDER — METRONIDAZOLE 500 MG PO TABS: 500.0000 mg | ORAL_TABLET | Freq: Two times a day (BID) | ORAL | 0 refills | Status: DC

## 2019-09-18 ENCOUNTER — Telehealth: Payer: Self-pay

## 2019-09-18 ENCOUNTER — Ambulatory Visit: Payer: Managed Care, Other (non HMO) | Admitting: Obstetrics and Gynecology

## 2019-09-18 ENCOUNTER — Encounter: Payer: Self-pay | Admitting: Obstetrics and Gynecology

## 2019-09-18 NOTE — Telephone Encounter (Signed)
Patient did not keep office visit today.

## 2019-09-19 NOTE — Telephone Encounter (Signed)
Left message to call Noreene Larsson, RN at [facility] [phone].    OV for TOC. Positive trich 08/22/19, retest 2-4 wks.

## 2019-09-25 NOTE — Telephone Encounter (Signed)
Spoke with patient. OV r/s to 10/29/19 at 4:30pm.  Patient declined multiple earlier appts due to her work schedule.  Advised patient if she can be seen sooner, return call to office for earlier OV.  Patient verbalizes understanding and is agreeable.   Encounter closed.

## 2019-10-11 ENCOUNTER — Telehealth: Payer: Self-pay

## 2019-10-11 ENCOUNTER — Other Ambulatory Visit: Payer: Self-pay | Admitting: *Deleted

## 2019-10-11 DIAGNOSIS — E1159 Type 2 diabetes mellitus with other circulatory complications: Secondary | ICD-10-CM

## 2019-10-11 DIAGNOSIS — E1165 Type 2 diabetes mellitus with hyperglycemia: Secondary | ICD-10-CM

## 2019-10-11 MED ORDER — OZEMPIC (1 MG/DOSE) 2 MG/1.5ML ~~LOC~~ SOPN: 1.0000 mg | PEN_INJECTOR | SUBCUTANEOUS | 0 refills | Status: DC

## 2019-10-11 NOTE — Telephone Encounter (Signed)
.   LAST APPOINTMENT DATE: 08/20/2019   NEXT APPOINTMENT DATE:@Visit  date not found  MEDICATION:Semaglutide, 1 MG/DOSE, (OZEMPIC, 1 MG/DOSE,) 2 MG/1.5ML SOPN   PHARMACY:[facility] [place], [place] - [address]  *Pt is asking for a few refills to be sent in

## 2019-10-11 NOTE — Telephone Encounter (Signed)
Rx send to pharmacy  

## 2019-10-29 ENCOUNTER — Ambulatory Visit (INDEPENDENT_AMBULATORY_CARE_PROVIDER_SITE_OTHER): Payer: Managed Care, Other (non HMO) | Admitting: Obstetrics and Gynecology

## 2019-10-29 ENCOUNTER — Encounter: Payer: Self-pay | Admitting: Obstetrics and Gynecology

## 2019-10-29 ENCOUNTER — Other Ambulatory Visit: Payer: Self-pay

## 2019-10-29 VITALS — BP 136/72 | HR 113 | Resp 93 | Ht 63.0 in | Wt 226.0 lb

## 2019-10-29 DIAGNOSIS — Z8619 Personal history of other infectious and parasitic diseases: Secondary | ICD-10-CM | POA: Diagnosis not present

## 2019-10-29 DIAGNOSIS — N760 Acute vaginitis: Secondary | ICD-10-CM

## 2019-10-29 MED ORDER — BETAMETHASONE VALERATE 0.1 % EX OINT: 1.0000 "application " | TOPICAL_OINTMENT | Freq: Two times a day (BID) | CUTANEOUS | 0 refills | Status: AC

## 2019-10-29 NOTE — Progress Notes (Signed)
GYNECOLOGY  VISIT   HPI: 39 y.o.   Legally Separated Black or African American Not Hispanic or Latino  female   [id number] with Patient's last menstrual period was 10/10/2019.   here for [facility] trich. Tanya Hardy was diagnosed in 7/21, other STD testing was negative. She and her partner were both treated, not currently together. She has had a slight vaginal itch intermittently for 2 weeks. No abnormal d/c.  Not using condoms.   GYNECOLOGIC HISTORY: Patient's last menstrual period was 10/10/2019. Contraception: none Menopausal hormone therapy: n/a        OB History    Gravida  1   Para      Term      Preterm      AB  1   Living        SAB  1   TAB      Ectopic      Multiple      Live Births                 Patient Active Problem List   Diagnosis Date Noted  . Type 2 diabetes mellitus with hyperglycemia, without long-term current use of insulin (HCC) 05/23/2019  . Type 2 diabetes mellitus with microalbuminuria, without long-term current use of insulin (HCC) 09/07/2018  . Hypertriglyceridemia 09/07/2018  . Morbid obesity (HCC) 09/07/2018  . Morbid obesity with BMI of 40.0-44.9, adult (HCC) 09/04/2018  . Mixed diabetic hyperlipidemia associated with type 2 diabetes mellitus (HCC), on Zocor 07/28/2017  . Obesity, diabetes, and hypertension syndrome (HCC) 07/28/2017  . Elevated LFTs 07/28/2017  . Macroalbuminuric diabetic nephropathy (HCC), on ARB 10/01/2014  . Secondary amenorrhea 09/30/2014  . PCOS (polycystic ovarian syndrome) 09/30/2014  . Diabetes type 2, uncontrolled (HCC), on Ozempic 07/04/2013  . Hypertension associated with diabetes (HCC) 07/04/2013  . Sarcoidosis 10/06/2012    Past Medical History:  Diagnosis Date  . Amenorrhea   . Anxiety   . Chronic sinusitis   . Complication of anesthesia    anesthesia not strong enough, fought during surgery  . Depression 2011   treated at the time, no longer on meds  . Diabetes mellitus without complication (HCC)     diet controlled  . Headache(784.0)    migraines (rare now)  . Hypertension   . Infertility, female   . Obesity   . PCOS (polycystic ovarian syndrome)   . Sarcoidosis 2015   SOB  . Shortness of breath    going upstairs, being worked up for sarcoidoisi  . Trichomonas infection 08/2019    Past Surgical History:  Procedure Laterality Date  . NASAL SINUS SURGERY N/A 09/15/2012   Procedure: BILATERAL ENDOSCOPIC MAXILLARY ANTROSTOMY/ETHMOIDECTOMY/FRONTAL SINUS SURGERY and sphenoidotomy;  Surgeon: Serena Colonel, MD;  Location: [facility] OR;  Service: ENT;  Laterality: N/A;  . VIDEO BRONCHOSCOPY Bilateral 05/15/2012   pt styated not completed    Current Outpatient Medications  Medication Sig Dispense Refill  . atorvastatin (LIPITOR) 40 MG tablet Take 1 tablet (40 mg total) by mouth daily. 90 tablet 1  . citalopram (CELEXA) 20 MG tablet Take 1 tablet (20 mg total) by mouth daily. 90 tablet 3  . dapagliflozin propanediol (FARXIGA) 10 MG TABS tablet Take 1 tablet (10 mg total) by mouth daily. 30 tablet 6  . insulin glargine (LANTUS SOLOSTAR) 100 UNIT/ML Solostar Pen Inject 20 Units into the skin daily. 15 mL 6  . Insulin Pen Needle 31G X 5 MM MISC 1 Device by Does not apply route daily. 50 each  6  . losartan (COZAAR) 50 MG tablet Take 1 tablet by mouth once daily 30 tablet 5  . medroxyPROGESTERone (PROVERA) 5 MG tablet Take one tablet a day for 5 days every other month if no spontaneous menses. 15 tablet 1  . omeprazole (PRILOSEC) 20 MG capsule Take 1 capsule (20 mg total) by mouth in the morning. 30 capsule 3  . Semaglutide, 1 MG/DOSE, (OZEMPIC, 1 MG/DOSE,) 2 MG/1.5ML SOPN Inject 0.75 mLs (1 mg total) into the skin once a week. 4 mL 0   No current facility-administered medications for this visit.     ALLERGIES: Dexamethasone sodium phosphate  Family History  Problem Relation Age of Onset  . Hypertension Mother   . Sarcoidosis Brother   . Hypertension Brother   . Uterine cancer Paternal Aunt   .  Brain cancer Paternal Grandmother   . Diabetes Paternal Grandmother   . Diverticulitis Paternal Grandmother     Social History   Socioeconomic History  . Marital status: Legally Separated    Spouse name: Not on file  . Number of children: 0  . Years of education: Not on file  . Highest education level: Not on file  Occupational History  . Occupation: Works at a [employer]  Tobacco Use  . Smoking status: Former Smoker    Packs/day: 0.25    Years: 11.00    Pack years: 2.75    Types: Cigarettes, E-cigarettes    Quit date: 04/07/2012    Years since quitting: 7.5  . Smokeless tobacco: Never Used  Vaping Use  . Vaping Use: Some days  Substance and Sexual Activity  . Alcohol use: Not Currently  . Drug use: No  . Sexual activity: Yes    Partners: Male    Birth control/protection: Condom  Other Topics Concern  . Not on file  Social History Narrative   Single, recently divorced   4 kids --> 19, 15, 37, 11   Works doing Dentist Strain:   . Difficulty of Paying Living Expenses: Not on file  Food Insecurity:   . Worried About Programme researcher, broadcasting/film/video in the Last Year: Not on file  . Ran Out of Food in the Last Year: Not on file  Transportation Needs:   . Lack of Transportation (Medical): Not on file  . Lack of Transportation (Non-Medical): Not on file  Physical Activity:   . Days of Exercise per Week: Not on file  . Minutes of Exercise per Session: Not on file  Stress:   . Feeling of Stress : Not on file  Social Connections:   . Frequency of Communication with Friends and Family: Not on file  . Frequency of Social Gatherings with Friends and Family: Not on file  . Attends Religious Services: Not on file  . Active Member of Clubs or Organizations: Not on file  . Attends Banker Meetings: Not on file  . Marital Status: Not on file  Intimate Partner Violence:   . Fear of Current or Ex-Partner: Not on file  .  Emotionally Abused: Not on file  . Physically Abused: Not on file  . Sexually Abused: Not on file    Review of Systems  Constitutional: Negative.   HENT: Negative.   Eyes: Negative.   Respiratory: Negative.   Cardiovascular: Negative.   Gastrointestinal: Negative.   Genitourinary: Negative.   Musculoskeletal: Negative.   Skin: Negative.   Neurological: Negative.   Endo/Heme/Allergies: Negative.  Psychiatric/Behavioral: Negative.     PHYSICAL EXAMINATION:    BP 136/72 (BP Location: Right Arm, Patient Position: Sitting, Cuff Size: Normal)   Pulse (!) 113   Resp (!) 93   Ht 5\' 3"  (1.6 m)   Wt 226 lb (102.5 kg)   LMP 10/10/2019   BMI 40.03 kg/m     General appearance: alert, cooperative and appears stated age  Pelvic: External genitalia:  no lesions, mild erythema              Urethra:  normal appearing urethra with no masses, tenderness or lesions              Bartholins and Skenes: normal                 Vagina: normal appearing vagina with normal color and discharge, no lesions              Cervix: no lesions  Chaperone was present for exam.  Wet prep: ? clue, no trich, few wbc KOH: no yeast PH: 4.5   ASSESSMENT H/O trich Vulvovaginitis, slides not clear, pH slightly elevated    PLAN Nuswab for BV, Trich and candida Steroid ointment for itching

## 2019-10-29 NOTE — Patient Instructions (Signed)
Trichomoniasis Trichomoniasis is an STI (sexually transmitted infection) that can affect both women and men. In women, the outer area of the female genitalia (vulva) and the vagina are affected. In men, mainly the penis is affected, but the prostate and other reproductive organs can also be involved.  This condition can be treated with medicine. It often has no symptoms (is asymptomatic), especially in men. If not treated, trichomoniasis can last for months or years. What are the causes? This condition is caused by a parasite called Trichomonas vaginalis. Trichomoniasis most often spreads from person to person (is contagious) through sexual contact. What increases the risk? The following factors may make you more likely to develop this condition:  Having unprotected sex.  Having sex with a partner who has trichomoniasis.  Having multiple sexual partners.  Having had previous trichomoniasis infections or other STIs. What are the signs or symptoms? In women, symptoms of trichomoniasis include:  Abnormal vaginal discharge that is clear, white, gray, or yellow-green and foamy and has an unusual "fishy" odor.  Itching and irritation of the vagina and vulva.  Burning or pain during urination or sex.  Redness and swelling of the genitals. In men, symptoms of trichomoniasis include:  Penile discharge that may be foamy or contain pus.  Pain in the penis. This may happen only when urinating.  Itching or irritation inside the penis.  Burning after urination or ejaculation. How is this diagnosed? In women, this condition may be found during a routine Pap test or physical exam. It may be found in men during a routine physical exam. Your health care provider may do tests to help diagnose this infection, such as:  Urine tests (men and women).  The following in women: ? Testing the pH of the vagina. ? A vaginal swab test that checks for the Trichomonas vaginalis parasite. ? Testing vaginal  secretions. Your health care provider may test you for other STIs, including HIV (human immunodeficiency virus). How is this treated? This condition is treated with medicine taken by mouth (orally), such as metronidazole or tinidazole, to fight the infection. Your sexual partner(s) also need to be tested and treated.  If you are a woman and you plan to become pregnant or think you may be pregnant, tell your health care provider right away. Some medicines that are used to treat the infection should not be taken during pregnancy. Your health care provider may recommend over-the-counter medicines or creams to help relieve itching or irritation. You may be tested for infection again 3 months after treatment. Follow these instructions at home:  Take and use over-the-counter and prescription medicines, including creams, only as told by your health care provider.  Take your antibiotic medicine as told by your health care provider. Do not stop taking the antibiotic even if you start to feel better.  Do not have sex until 7-10 days after you finish your medicine, or until your health care provider approves. Ask your health care provider when you may start to have sex again.  (Women) Do not douche or wear tampons while you have the infection.  Discuss your infection with your sexual partner(s). Make sure that your partner gets tested and treated, if necessary.  Keep all follow-up visits as told by your health care provider. This is important. How is this prevented?   Use condoms every time you have sex. Using condoms correctly and consistently can help protect against STIs.  Avoid having multiple sexual partners.  Talk with your sexual partner about any   symptoms that either of you may have, as well as any history of STIs.  Get tested for STIs and STDs (sexually transmitted diseases) before you have sex. Ask your partner to do the same.  Do not have sexual contact if you have symptoms of  trichomoniasis or another STI. Contact a health care provider if:  You still have symptoms after you finish your medicine.  You develop pain in your abdomen.  You have pain when you urinate.  You have bleeding after sex.  You develop a rash.  You feel nauseous or you vomit.  You plan to become pregnant or think you may be pregnant. Summary  Trichomoniasis is an STI (sexually transmitted infection) that can affect both women and men.  This condition often has no symptoms (is asymptomatic), especially in men.  Without treatment, this condition can last for months or years.  You should not have sex until 7-10 days after you finish your medicine, or until your health care provider approves. Ask your health care provider when you may start to have sex again.  Discuss your infection with your sexual partner(s). Make sure that your partner gets tested and treated, if necessary. This information is not intended to replace advice given to you by your health care provider. Make sure you discuss any questions you have with your health care provider. Document Revised: 11/08/2017 Document Reviewed: 11/08/2017 Elsevier Patient Education  2020 Elsevier Inc.  

## 2019-11-01 LAB — NUSWAB VAGINITIS (VG)
Candida albicans, NAA: NEGATIVE
Candida glabrata, NAA: NEGATIVE
Trich vag by NAA: NEGATIVE

## 2019-11-06 ENCOUNTER — Telehealth: Payer: Self-pay

## 2019-11-06 NOTE — Telephone Encounter (Signed)
Reached out to [facility], they have not received the testing kit back from the patient.

## 2019-11-12 ENCOUNTER — Ambulatory Visit: Payer: Managed Care, Other (non HMO)

## 2019-11-19 ENCOUNTER — Ambulatory Visit: Payer: Managed Care, Other (non HMO) | Admitting: Internal Medicine

## 2019-12-10 ENCOUNTER — Telehealth: Payer: Self-pay

## 2019-12-10 NOTE — Telephone Encounter (Signed)
AEX 08/22/19 H/o Trich 08/2019- Neg TOC 10/2019 No contraception  Spoke with pt. Pt reports having lower abd pain/cramps that radiate to vagina x 2 weeks. Pt states also having sx of nausea, breast tenderness x 2 weeks intermittently. Did report having nausea with vomiting all last week and having loose stools. Denies any fever, chills, severe abd pain and all vaginal and UTI sx.   LMP 09/2019- normal per pt. LMP 9/1 was very light and didn't have much bleeding. States no cycle in Oct and no start in Nov. Pt is SA and not using protection or is on contraception. Pt had history of miscarriage in 10/2013. Pt has not taking UPT at this time.  Advised pt to take home UPT with first void in morning and call with results, then to be set up for OV. Pt agreeable.  Will wait for pt to return call with UPT results.

## 2019-12-10 NOTE — Telephone Encounter (Signed)
Patient is calling in regards to having pelvic pain. Patient states she would like to be called back around 2:30 due to work.

## 2019-12-17 NOTE — Telephone Encounter (Signed)
Spoke with pt. Call placed to pt for update. Pt states started cycle finally on 12/13/19 and had neg UPT on 11/2. Pt states all previous said sx have resolved. Pt states heavier cycle than normal since skipped Oct cycle. Changed pad every 5-6 hours, denies any soaking pads, clots, feeling weak, dizzy or lightheaded. Pt advised to continue to monitor and call in future with any concerns. Pt agreeable.  Routing to Dr Oscar La for update.  Encounter closed

## 2020-01-09 ENCOUNTER — Other Ambulatory Visit: Payer: Self-pay | Admitting: Physician Assistant

## 2020-01-09 DIAGNOSIS — E669 Obesity, unspecified: Secondary | ICD-10-CM

## 2020-01-09 DIAGNOSIS — E1165 Type 2 diabetes mellitus with hyperglycemia: Secondary | ICD-10-CM

## 2020-01-09 DIAGNOSIS — E1159 Type 2 diabetes mellitus with other circulatory complications: Secondary | ICD-10-CM

## 2020-04-01 ENCOUNTER — Other Ambulatory Visit: Payer: Self-pay | Admitting: Physician Assistant

## 2020-04-01 DIAGNOSIS — E1165 Type 2 diabetes mellitus with hyperglycemia: Secondary | ICD-10-CM

## 2020-04-01 DIAGNOSIS — E1159 Type 2 diabetes mellitus with other circulatory complications: Secondary | ICD-10-CM

## 2020-04-01 DIAGNOSIS — E669 Obesity, unspecified: Secondary | ICD-10-CM

## 2020-04-19 ENCOUNTER — Encounter (HOSPITAL_COMMUNITY): Payer: Self-pay

## 2020-04-19 ENCOUNTER — Ambulatory Visit (HOSPITAL_COMMUNITY)
Admission: EM | Admit: 2020-04-19 | Discharge: 2020-04-19 | Disposition: A | Discharge status: Home / Self Care | Payer: Managed Care, Other (non HMO) | Attending: Family Medicine | Admitting: Family Medicine

## 2020-04-19 ENCOUNTER — Other Ambulatory Visit: Payer: Self-pay

## 2020-04-19 DIAGNOSIS — S39012A Strain of muscle, fascia and tendon of lower back, initial encounter: Secondary | ICD-10-CM | POA: Diagnosis not present

## 2020-04-19 MED ORDER — KETOROLAC TROMETHAMINE 60 MG/2ML IM SOLN
INTRAMUSCULAR | Status: AC
  Filled 2020-04-19: qty 2

## 2020-04-19 MED ORDER — CYCLOBENZAPRINE HCL 10 MG PO TABS: 10.0000 mg | ORAL_TABLET | Freq: Three times a day (TID) | ORAL | 0 refills | Status: AC | PRN

## 2020-04-19 MED ORDER — NAPROXEN 500 MG PO TABS: 500.0000 mg | ORAL_TABLET | Freq: Two times a day (BID) | ORAL | 0 refills | Status: AC | PRN

## 2020-04-19 MED ORDER — KETOROLAC TROMETHAMINE 60 MG/2ML IM SOLN
60.0000 mg | Freq: Once | INTRAMUSCULAR | Status: AC
  Administered 2020-04-19: 60 mg via INTRAMUSCULAR

## 2020-04-19 NOTE — ED Provider Notes (Signed)
[facility]    CSN: [id number] Arrival date & time: 04/19/20  1130      History   Chief Complaint Chief Complaint  Patient presents with  . Back Pain    HPI Tanya Hardy is a 40 y.o. female.   Here today with low back pain that starts in the middle and extends outward and downward that started yesterday after she finished moving her king-size bed.  She states the pain shoots down her thighs from buttock and wraps anteriorly to the quadricep muscles.  She denies bowel or bladder incontinence, saddle anesthesia, weakness in the legs, numbness or tingling, swelling down the legs.  Her entire back is been very stiff since waking up this morning and movements are very painful diffusely in back muscles.  Took some ibuprofen last night without much relief and has been taking Epsom salt baths.  No past history of chronic back issues.     Past Medical History:  Diagnosis Date  . Amenorrhea   . Anxiety   . Chronic sinusitis   . Complication of anesthesia    anesthesia not strong enough, fought during surgery  . Depression 2011   treated at the time, no longer on meds  . Diabetes mellitus without complication (HCC)    diet controlled  . Headache(784.0)    migraines (rare now)  . Hypertension   . Infertility, female   . Obesity   . PCOS (polycystic ovarian syndrome)   . Sarcoidosis 2015   SOB  . Shortness of breath    going upstairs, being worked up for sarcoidoisi  . Trichomonas infection 08/2019    Patient Active Problem List   Diagnosis Date Noted  . Type 2 diabetes mellitus with hyperglycemia, without long-term current use of insulin (HCC) 05/23/2019  . Type 2 diabetes mellitus with microalbuminuria, without long-term current use of insulin (HCC) 09/07/2018  . Hypertriglyceridemia 09/07/2018  . Morbid obesity (HCC) 09/07/2018  . Morbid obesity with BMI of 40.0-44.9, adult (HCC) 09/04/2018  . Mixed diabetic hyperlipidemia associated with type 2 diabetes  mellitus (HCC), on Zocor 07/28/2017  . Obesity, diabetes, and hypertension syndrome (HCC) 07/28/2017  . Elevated LFTs 07/28/2017  . Macroalbuminuric diabetic nephropathy (HCC), on ARB 10/01/2014  . Secondary amenorrhea 09/30/2014  . PCOS (polycystic ovarian syndrome) 09/30/2014  . Diabetes type 2, uncontrolled (HCC), on Ozempic 07/04/2013  . Hypertension associated with diabetes (HCC) 07/04/2013  . Sarcoidosis 10/06/2012    Past Surgical History:  Procedure Laterality Date  . NASAL SINUS SURGERY N/A 09/15/2012   Procedure: BILATERAL ENDOSCOPIC MAXILLARY ANTROSTOMY/ETHMOIDECTOMY/FRONTAL SINUS SURGERY and sphenoidotomy;  Surgeon: Serena Colonel, MD;  Location: [facility];  Service: ENT;  Laterality: N/A;  . VIDEO BRONCHOSCOPY Bilateral 05/15/2012   pt styated not completed    OB History    Gravida  1   Para      Term      Preterm      AB  1   Living        SAB  1   IAB      Ectopic      Multiple      Live Births               Home Medications    Prior to Admission medications   Medication Sig Start Date End Date Taking? Authorizing Provider  cyclobenzaprine (FLEXERIL) 10 MG tablet Take 1 tablet (10 mg total) by mouth 3 (three) times daily as needed for muscle spasms. DO NOT DRINK  ALCOHOL OR DRIVE WHILE TAKING THIS MEDICATION 04/19/20  Yes Particia NearingLane, Makynna Manocchio Elizabeth, PA-C  naproxen (NAPROSYN) 500 MG tablet Take 1 tablet (500 mg total) by mouth 2 (two) times daily as needed. 04/19/20  Yes Particia NearingLane, Robbi Scurlock Elizabeth, PA-C  atorvastatin (LIPITOR) 40 MG tablet Take 1 tablet (40 mg total) by mouth daily. 05/21/19   Jarold MottoWorley, Samantha, PA  betamethasone valerate ointment (VALISONE) 0.1 % Apply 1 application topically 2 (two) times daily. Can use for up to 2 weeks if needed 10/29/19   [name], [name], MD  citalopram (CELEXA) 20 MG tablet Take 1 tablet (20 mg total) by mouth daily. 08/22/19   [name], [name], MD  dapagliflozin propanediol (FARXIGA) 10 MG TABS tablet Take 1 tablet (10 mg  total) by mouth daily. 08/20/19   Jarold MottoWorley, Samantha, PA  insulin glargine (LANTUS SOLOSTAR) 100 UNIT/ML Solostar Pen Inject 20 Units into the skin daily. 05/23/19   Shamleffer, Konrad DoloresIbtehal Jaralla, MD  Insulin Pen Needle 31G X 5 MM MISC 1 Device by Does not apply route daily. 05/23/19   Shamleffer, Konrad DoloresIbtehal Jaralla, MD  losartan (COZAAR) 50 MG tablet Take 1 tablet by mouth once daily 06/25/19   Jarold MottoWorley, Samantha, PA  medroxyPROGESTERone (PROVERA) 5 MG tablet Take one tablet a day for 5 days every other month if no spontaneous menses. 08/22/19   [name], [name], MD  omeprazole (PRILOSEC) 20 MG capsule Take 1 capsule (20 mg total) by mouth in the morning. 06/05/19   Esterwood, Amy S, PA-C  OZEMPIC, 1 MG/DOSE, 4 MG/3ML SOPN INJECT 1 MG SUBCUTANEOUSLY ONCE A WEEK 04/01/20   Jarold MottoWorley, Samantha, PA    Family History Family History  Problem Relation Age of Onset  . Hypertension Mother   . Sarcoidosis Brother   . Hypertension Brother   . Uterine cancer Paternal Aunt   . Brain cancer Paternal Grandmother   . Diabetes Paternal Grandmother   . Diverticulitis Paternal Grandmother     Social History Social History   Tobacco Use  . Smoking status: Former Smoker    Packs/day: 0.25    Years: 11.00    Pack years: 2.75    Types: Cigarettes, E-cigarettes    Quit date: 04/07/2012    Years since quitting: 8.0  . Smokeless tobacco: Never Used  Vaping Use  . Vaping Use: Some days  Substance Use Topics  . Alcohol use: Not Currently  . Drug use: No     Allergies   Dexamethasone sodium phosphate   Review of Systems Review of Systems Per HPI  Physical Exam Triage Vital Signs ED Triage Vitals  Enc Vitals Group     BP 04/19/20 1143 (!) 159/102     Pulse Rate 04/19/20 1143 93     Resp 04/19/20 1143 20     Temp --      Temp src --      SpO2 04/19/20 1143 100 %     Weight --      Height --      Head Circumference --      Peak Flow --      Pain Score 04/19/20 1142 10     Pain Loc --      Pain  Edu? --      Excl. in GC? --    No data found.  Updated Vital Signs BP (!) 159/102   Pulse 93   Resp 20   SpO2 100%   Visual Acuity Right Eye Distance:   Left Eye Distance:   Bilateral  Distance:    Right Eye Near:   Left Eye Near:    Bilateral Near:     Physical Exam Vitals and nursing note reviewed.  Constitutional:      Appearance: Normal appearance. She is not ill-appearing.  HENT:     Head: Atraumatic.  Eyes:     Extraocular Movements: Extraocular movements intact.     Conjunctiva/sclera: Conjunctivae normal.  Cardiovascular:     Rate and Rhythm: Normal rate and regular rhythm.     Heart sounds: Normal heart sounds.  Pulmonary:     Effort: Pulmonary effort is normal.     Breath sounds: Normal breath sounds.  Abdominal:     General: Bowel sounds are normal. There is no distension.     Palpations: Abdomen is soft.     Tenderness: There is no abdominal tenderness. There is no guarding.  Musculoskeletal:        General: Normal range of motion.     Cervical back: Normal range of motion and neck supple.     Comments: Initially was unable to get patient to lean forward for palpation of back, but after about 20 minutes on IM Toradol patient was able to manipulate into positions needed for orthopedic exam.  Diffuse lumbar region tender to palpation, negative straight leg raise bilaterally good strength bilateral lower extremities  Skin:    General: Skin is warm and dry.  Neurological:     Mental Status: She is alert and oriented to person, place, and time.     Motor: No weakness.     Gait: Gait abnormal.     Comments: Antalgic gait Bilateral lower extremities neurovascularly intact  Psychiatric:        Mood and Affect: Mood normal.        Thought Content: Thought content normal.        Judgment: Judgment normal.      UC Treatments / Results  Labs (all labs ordered are listed, but only abnormal results are displayed) Labs Reviewed - No data to  display  EKG   Radiology No results found.  Procedures Procedures (including critical care time)  Medications Ordered in UC Medications  ketorolac (TORADOL) injection 60 mg (60 mg Intramuscular Given 04/19/20 1210)    Initial Impression / Assessment and Plan / UC Course  I have reviewed the triage vital signs and the nursing notes.  Pertinent labs & imaging results that were available during my care of the patient were reviewed by me and considered in my medical decision making (see chart for details).     Consistent with muscle strain from moving heavy mattress.  Significant relief soon after IM Toradol, will treat with Flexeril, naproxen as needed, heat, lidocaine patches, stretches, Epsom salt soaks.  Follow-up with sports medicine if not improving.  Work note given for rest. Final Clinical Impressions(s) / UC Diagnoses   Final diagnoses:  Strain of lumbar region, initial encounter   Discharge Instructions   None    ED Prescriptions    Medication Sig Dispense Auth. Provider   naproxen (NAPROSYN) 500 MG tablet Take 1 tablet (500 mg total) by mouth 2 (two) times daily as needed. 30 tablet Particia Nearing, [place]   cyclobenzaprine (FLEXERIL) 10 MG tablet Take 1 tablet (10 mg total) by mouth 3 (three) times daily as needed for muscle spasms. DO NOT DRINK ALCOHOL OR DRIVE WHILE TAKING THIS MEDICATION 15 tablet Particia Nearing, [place]     PDMP not reviewed this encounter.   Maurice March,  Salley Hews, PA-C 04/20/20 1127

## 2020-04-19 NOTE — ED Triage Notes (Addendum)
Pt in with c/o lower back pain that started yesterday when she tried to move her king size bed. States the pain shoots down her legs  Pt took epsom salt bath and stretched with no relief

## 2020-04-24 ENCOUNTER — Telehealth (INDEPENDENT_AMBULATORY_CARE_PROVIDER_SITE_OTHER): Payer: Managed Care, Other (non HMO) | Admitting: Physician Assistant

## 2020-04-24 ENCOUNTER — Encounter: Payer: Self-pay | Admitting: Physician Assistant

## 2020-04-24 DIAGNOSIS — S39012D Strain of muscle, fascia and tendon of lower back, subsequent encounter: Secondary | ICD-10-CM | POA: Diagnosis not present

## 2020-04-24 MED ORDER — METHOCARBAMOL 500 MG PO TABS: 500.0000 mg | ORAL_TABLET | Freq: Four times a day (QID) | ORAL | 0 refills | Status: AC

## 2020-04-24 NOTE — Patient Instructions (Signed)
Please call on Monday with an update for myself or Sam.  Take the medications as directed.  Try gentle massage and stretches at home.  Straight to the ER if any sudden worsening pain, weakness in legs, or loss of bowel or bladder function.    Lumbar Strain A lumbar strain, which is sometimes called a low-back strain, is a stretch or tear in a muscle or the strong cords of tissue that attach muscle to bone (tendons) in the lower back (lumbar spine). This type of injury occurs when muscles or tendons are torn or are stretched beyond their limits. Lumbar strains can range from mild to severe. Mild strains may involve stretching a muscle or tendon without tearing it. These may heal in 1-2 weeks. More severe strains involve tearing of muscle fibers or tendons. These will cause more pain and may take 6-8 weeks to heal. What are the causes? This condition may be caused by:  Trauma, such as a fall or a hit to the body.  Twisting or overstretching the back. This may result from doing activities that need a lot of energy, such as lifting heavy objects. What increases the risk? This injury is more common in:  Athletes.  People with obesity.  People who do repeated lifting, bending, or other movements that involve their back. What are the signs or symptoms? Symptoms of this condition may include:  Sharp or dull pain in the lower back that does not go away. The pain may extend to the buttocks.  Stiffness or limited range of motion.  Sudden muscle tightening (spasms). How is this diagnosed? This condition may be diagnosed based on:  Your symptoms.  Your medical history.  A physical exam.  Imaging tests, such as: ? X-rays. ? MRI. How is this treated? Treatment for this condition may include:  Rest.  Applying heat and cold to the affected area.  Over-the-counter medicines to help relieve pain and inflammation, such as NSAIDs.  Prescription pain medicine and muscle relaxants may be  needed for a short time.  Physical therapy. Follow these instructions at home: Managing pain, stiffness, and swelling  If directed, put ice on the injured area during the first 24 hours after your injury. ? Put ice in a plastic bag. ? Place a towel between your skin and the bag. ? Leave the ice on for 20 minutes, 2-3 times a day.  If directed, apply heat to the affected area as often as told by your health care provider. Use the heat source that your health care provider recommends, such as a moist heat pack or a heating pad. ? Place a towel between your skin and the heat source. ? Leave the heat on for 20-30 minutes. ? Remove the heat if your skin turns bright red. This is especially important if you are unable to feel pain, heat, or cold. You may have a greater risk of getting burned.      Activity  Rest and return to your normal activities as told by your health care provider. Ask your health care provider what activities are safe for you.  Do exercises as told by your health care provider. Medicines  Take over-the-counter and prescription medicines only as told by your health care provider.  Ask your health care provider if the medicine prescribed to you: ? Requires you to avoid driving or using heavy machinery. ? Can cause constipation. You may need to take these actions to prevent or treat constipation:  Drink enough fluid to keep  your urine pale yellow.  Take over-the-counter or prescription medicines.  Eat foods that are high in fiber, such as beans, whole grains, and fresh fruits and vegetables.  Limit foods that are high in fat and processed sugars, such as fried or sweet foods. Injury prevention To prevent a future low-back injury:  Always warm up properly before physical activity or sports.  Cool down and stretch after being active.  Use correct form when playing sports and lifting heavy objects. Bend your knees before you lift heavy objects.  Use good  posture when sitting and standing.  Stay physically fit and keep a healthy weight. ? Do at least 150 minutes of moderate-intensity exercise each week, such as brisk walking or water aerobics. ? Do strength exercises at least 2 times each week.   General instructions  Do not use any products that contain nicotine or tobacco, such as cigarettes, e-cigarettes, and chewing tobacco. If you need help quitting, ask your health care provider.  Keep all follow-up visits as told by your health care provider. This is important. Contact a health care provider if:  Your back pain does not improve after 6 weeks of treatment.  Your symptoms get worse. Get help right away if:  Your back pain is severe.  You are unable to stand or walk.  You develop pain in your legs.  You develop weakness in your buttocks or legs.  You have difficulty controlling when you urinate or when you have a bowel movement. ? You have frequent, painful, or bloody urination. ? You have a temperature over 101.28F (38.3C) Summary  A lumbar strain, which is sometimes called a low-back strain, is a stretch or tear in a muscle or the strong cords of tissue that attach muscle to bone (tendons) in the lower back (lumbar spine).  This type of injury occurs when muscles or tendons are torn or are stretched beyond their limits.  Rest and return to your normal activities as told by your health care provider. If directed, apply heat and ice to the affected area as often as told by your health care provider.  Take over-the-counter and prescription medicines only as told by your health care provider.  Contact a health care provider if you have new or worsening symptoms. This information is not intended to replace advice given to you by your health care provider. Make sure you discuss any questions you have with your health care provider. Document Revised: 11/24/2017 Document Reviewed: 11/24/2017 Elsevier Patient Education  2021  [employer].

## 2020-04-24 NOTE — Progress Notes (Signed)
Virtual Visit via Video Note  I connected with Tanya Hardy on 04/24/20 at  9:30 AM EDT by a video enabled telemedicine application and verified that I am speaking with the correct person using two identifiers.  Location: Patient: home Provider: Nature conservation officer at [employer]   I discussed the limitations of evaluation and management by telemedicine and the availability of in person appointments. The patient expressed understanding and agreed to proceed.   Only the patient and myself were present for today's video call.   History of Present Illness: DOI: 04-18-20 - Lumbar strain Tanya Hardy was dog-sitting and tried to get a puppy out from under the bed. Next day Tanya Hardy felt spasms and pain in her low back. Went to urgent care on 04/19/20.  Tanya Hardy was given IM Toradol 60 mg which did provide some relief for that day.  Tanya Hardy was also given naproxen 500 mg to take twice daily as well as Flexeril 10 mg tablets to take 3 times daily.  Tanya Hardy states that Tanya Hardy is still in a lot of pain despite taking these medications and trying gentle massage, ice, heating pads, stretches, and Epsom salt soaks.  Tanya Hardy was given an out of work note through yesterday and states that Tanya Hardy tried to work this morning from home, but Tanya Hardy could not sit up in the chair to complete her work.  Tanya Hardy is requesting a few more days to recover from this back injury.  Tanya Hardy does not have any chronic back issues.  Tanya Hardy does have a history of scoliosis.  Tanya Hardy has had a few flareups of back pain over the years, but nothing major.  Tanya Hardy does not have any weakness or loss of bowel or bladder function.  Tanya Hardy has not had any fevers or any other red flag symptoms.    Observations/Objective: Tanya Hardy is laying on her bed during the video chat.  Gen: Awake, alert, no acute distress Resp: Breathing is even and non-labored Psych: calm/pleasant demeanor Neuro: Alert and Oriented x 3, + facial symmetry, speech is clear.   Assessment and Plan: 1. Strain of  lumbar paraspinal muscle, subsequent encounter As Tanya Hardy is still having pain, I will write her out of work until next Tuesday 04-29-20.  Tanya Hardy will continue her current medication regimen with one change.  I will have her stop the Flexeril and have her try Robaxin instead.  Cautioned her not to drive or take alcohol with this medication.  If by next Tuesday Tanya Hardy is still not having much improvement, we will need her to see sports medicine at that time.  Patient is understanding and agreeable.   Follow Up Instructions:    I discussed the assessment and treatment plan with the patient. The patient was provided an opportunity to ask questions and all were answered. The patient agreed with the plan and demonstrated an understanding of the instructions.   The patient was advised to call back or seek an in-person evaluation if the symptoms worsen or if the condition fails to improve as anticipated.  Alandria Butkiewicz M Katrina Brosh, PA-C

## 2020-05-09 ENCOUNTER — Other Ambulatory Visit: Payer: Self-pay

## 2020-05-09 ENCOUNTER — Ambulatory Visit: Payer: Managed Care, Other (non HMO) | Admitting: Physician Assistant

## 2020-05-09 DIAGNOSIS — Z0289 Encounter for other administrative examinations: Secondary | ICD-10-CM

## 2020-05-09 IMAGING — US US ABDOMEN COMPLETE
1 series · 13 of 25 positions shown · non-contrast
Comparison: None.

CLINICAL DATA: Elevated liver enzymes

EXAM:
ABDOMEN ULTRASOUND COMPLETE

[Series 1: us abdomen complete · 0.19mm/px · 13 of 78 slices shown]
[im 1/78]
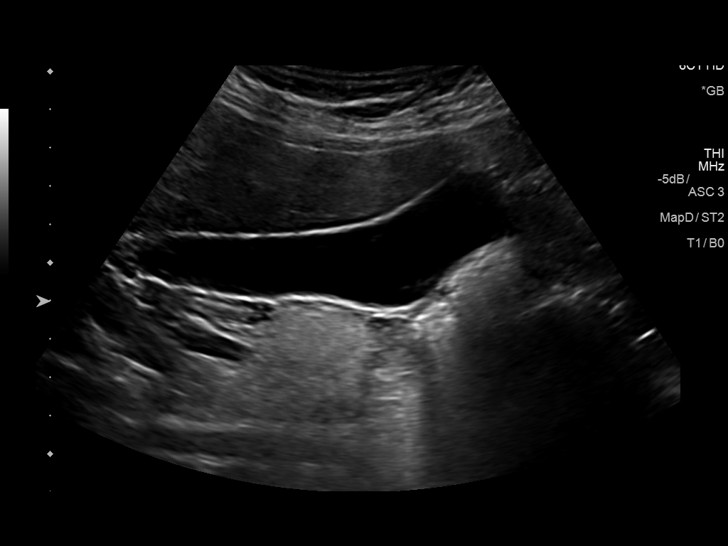
[im 7/78]
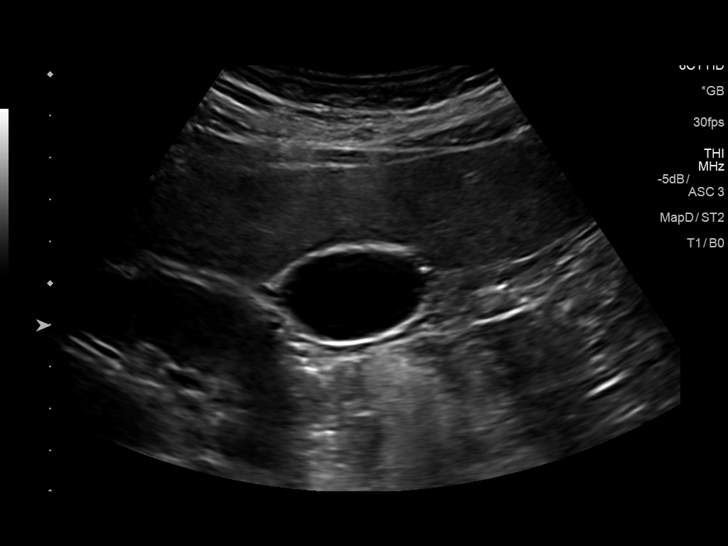
[im 13/78]
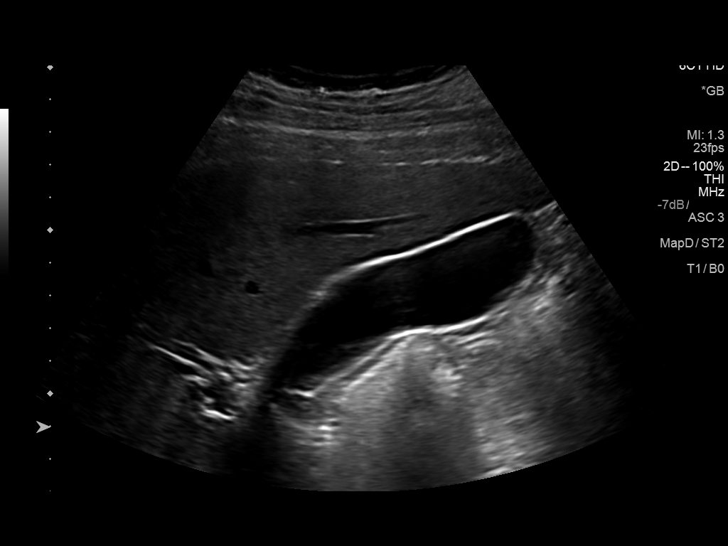
[im 20/78]
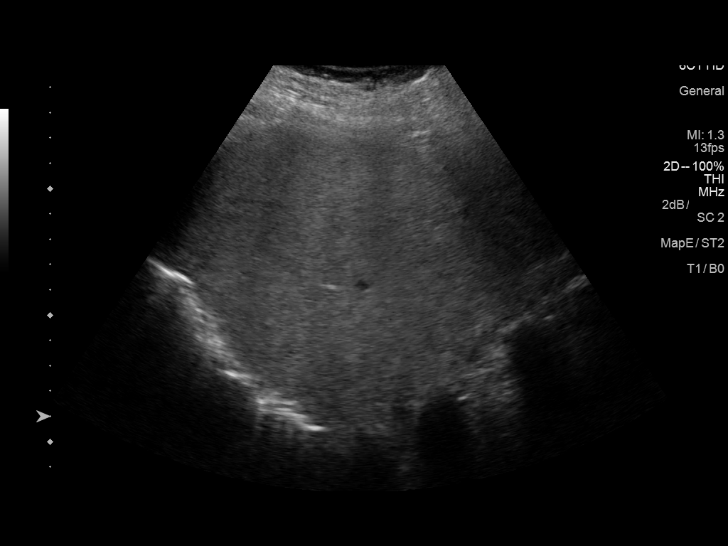
[im 26/78]
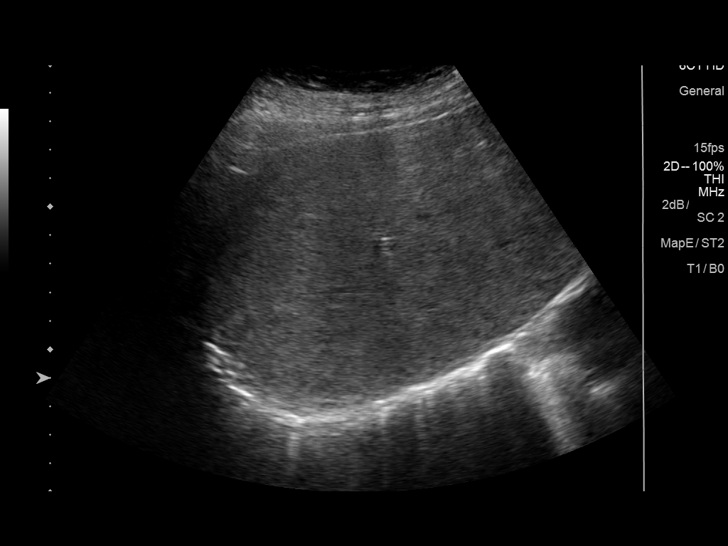
[im 33/78]
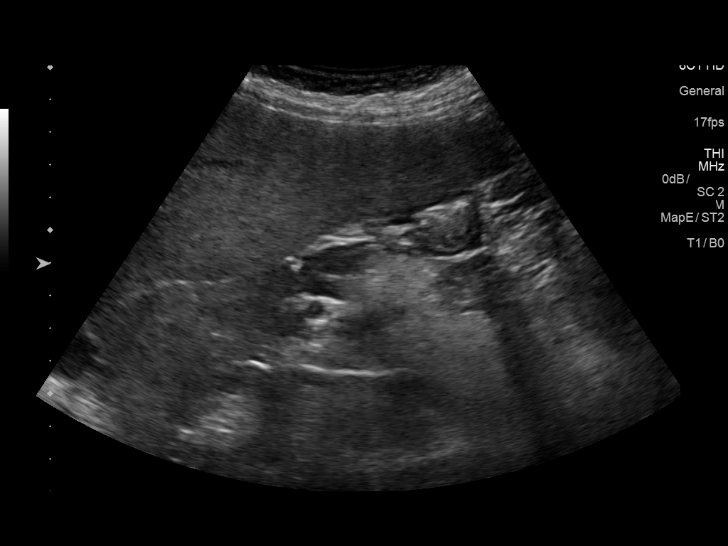
[im 39/78]
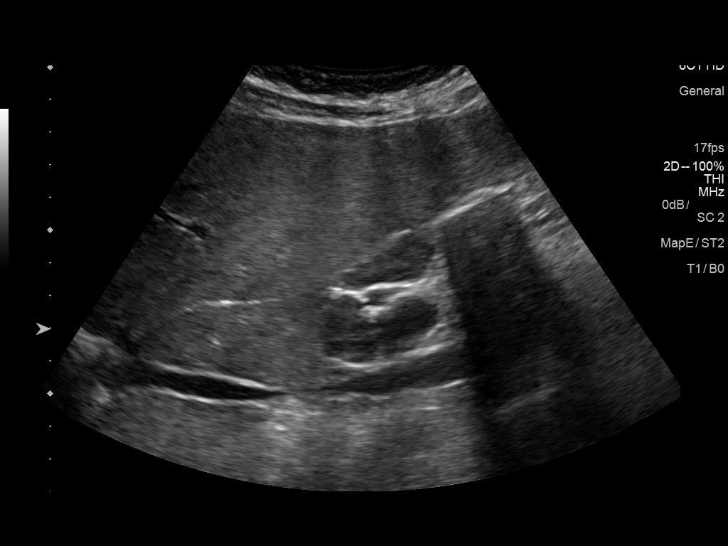
[im 45/78]
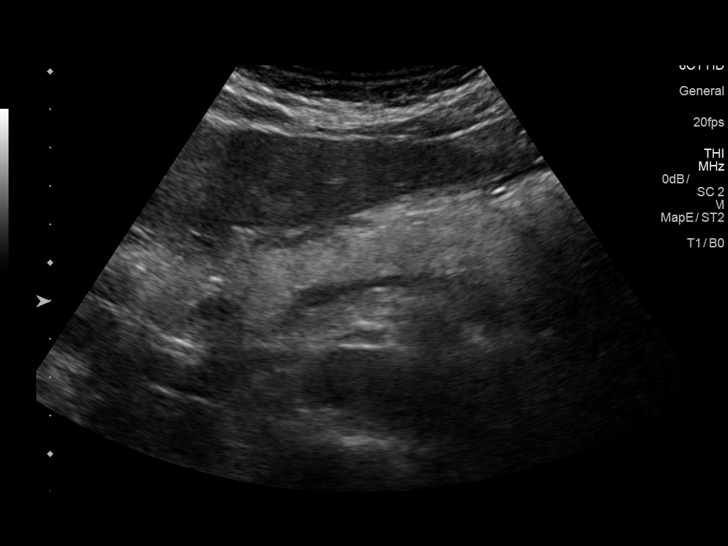
[im 52/78]
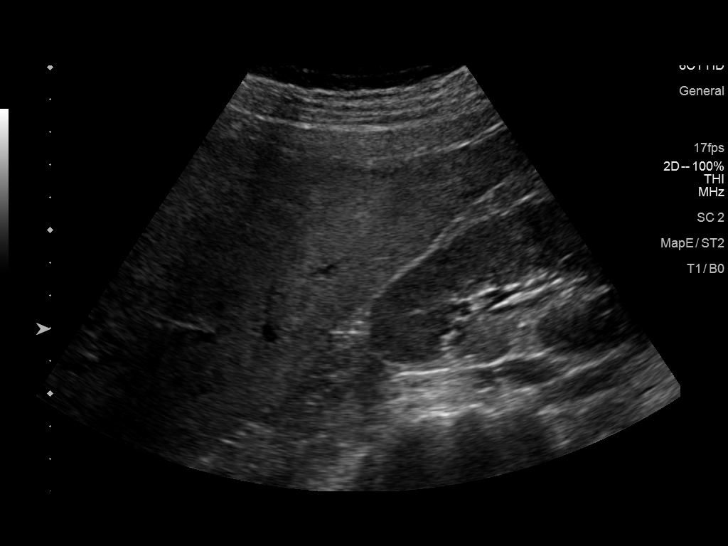
[im 58/78]
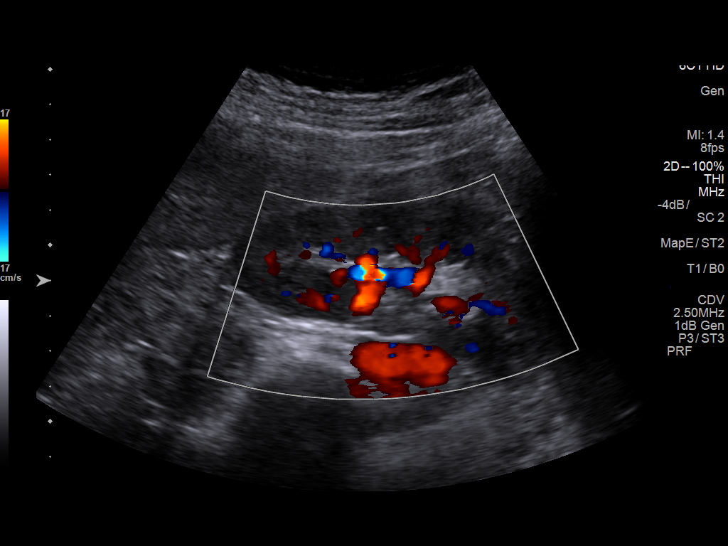
[im 65/78]
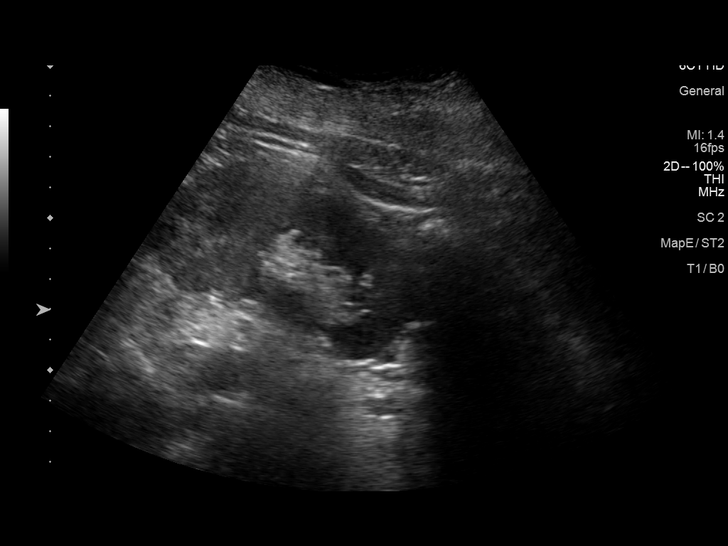
[im 71/78]
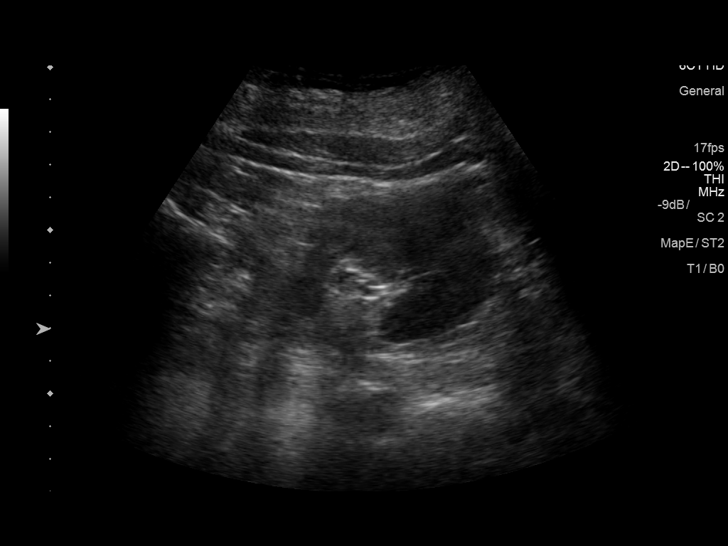
[im 78/78]
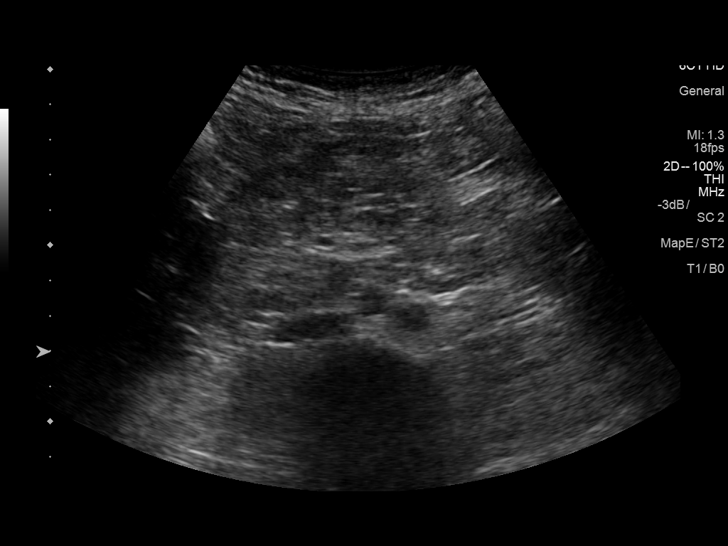

[13 of 25 positions shown; findings below may reference images not displayed]

FINDINGS: Gallbladder: No gallstones or wall thickening visualized. There is
no pericholecystic fluid. No sonographic Murphy sign noted by
sonographer.

Common bile duct: Diameter: 6 mm. There is no intrahepatic, common
hepatic, or common bile duct dilatation.

Liver: No focal lesion identified. Within normal limits in
parenchymal echogenicity. Portal vein is patent on color Doppler
imaging with normal direction of blood flow towards the liver.

IVC: No abnormality visualized.

Pancreas: There is a hypoechoic mass with a suspected central fatty
hilum immediately superior to the pancreatic head measuring 3.4 x
1.5 x 3.1 cm. Pancreas otherwise appears normal in visualized
regions. Note that a portion of the tail the pancreas is obscured by
gas.

Spleen: Size and appearance within normal limits.

Right Kidney: Length: 10.0 cm. Echogenicity within normal limits. No
mass or hydronephrosis visualized.

Left Kidney: Length: 10.0 cm. Echogenicity within normal limits. No
mass or hydronephrosis visualized.

Abdominal aorta: No aneurysm visualized.

Other findings: No demonstrable ascites.
IMPRESSION: 1. Suspect peripancreatic adenopathy at the level of the head of the
pancreas. This finding warrants abdominal CT with oral and
intravenous contrast to further evaluate. A portion of the tail the
pancreas is not seen. Pancreas region otherwise appears
unremarkable.

2.  Study otherwise unremarkable.

These results will be called to the ordering clinician or
representative by the Radiologist Assistant, and communication
documented in the PACS or zVision Dashboard.

## 2020-05-19 ENCOUNTER — Encounter: Payer: Self-pay | Admitting: Physician Assistant

## 2020-06-25 ENCOUNTER — Other Ambulatory Visit: Payer: Self-pay | Admitting: Physician Assistant

## 2020-06-25 DIAGNOSIS — E1159 Type 2 diabetes mellitus with other circulatory complications: Secondary | ICD-10-CM

## 2020-06-25 DIAGNOSIS — E1165 Type 2 diabetes mellitus with hyperglycemia: Secondary | ICD-10-CM

## 2020-10-27 ENCOUNTER — Telehealth: Payer: Self-pay

## 2020-10-27 NOTE — Telephone Encounter (Signed)
Left message on voicemail to call office. Pt needs to contact her new PCP for prior authorization for Ozempic since she is no longer under our care.

## 2020-10-27 NOTE — Telephone Encounter (Signed)
Spoke to pt told her received a fax from the pharmacy for PA for Ozempic told her need to contact pharmacy to have them send to your new PCP. Pt verbalized understanding.
